# Patient Record
Sex: Female | Born: 1940 | Race: White | Hispanic: No | Marital: Married | Smoking: Former smoker
Health system: Southern US, Community
[De-identification: ages and names within clinical notes are randomized; demographics above are authoritative.]

## PROBLEM LIST (undated history)

## (undated) DIAGNOSIS — J189 Pneumonia, unspecified organism: Secondary | ICD-10-CM

## (undated) DIAGNOSIS — I10 Essential (primary) hypertension: Secondary | ICD-10-CM

## (undated) DIAGNOSIS — E039 Hypothyroidism, unspecified: Secondary | ICD-10-CM

## (undated) DIAGNOSIS — K219 Gastro-esophageal reflux disease without esophagitis: Secondary | ICD-10-CM

## (undated) DIAGNOSIS — Z9981 Dependence on supplemental oxygen: Secondary | ICD-10-CM

## (undated) DIAGNOSIS — M1990 Unspecified osteoarthritis, unspecified site: Secondary | ICD-10-CM

## (undated) DIAGNOSIS — E785 Hyperlipidemia, unspecified: Secondary | ICD-10-CM

## (undated) DIAGNOSIS — J449 Chronic obstructive pulmonary disease, unspecified: Secondary | ICD-10-CM

## (undated) DIAGNOSIS — I2510 Atherosclerotic heart disease of native coronary artery without angina pectoris: Secondary | ICD-10-CM

## (undated) DIAGNOSIS — I700 Atherosclerosis of aorta: Secondary | ICD-10-CM

## (undated) DIAGNOSIS — T7840XA Allergy, unspecified, initial encounter: Secondary | ICD-10-CM

## (undated) DIAGNOSIS — C50919 Malignant neoplasm of unspecified site of unspecified female breast: Secondary | ICD-10-CM

## (undated) DIAGNOSIS — M069 Rheumatoid arthritis, unspecified: Secondary | ICD-10-CM

## (undated) DIAGNOSIS — G2581 Restless legs syndrome: Secondary | ICD-10-CM

## (undated) DIAGNOSIS — E119 Type 2 diabetes mellitus without complications: Secondary | ICD-10-CM

## (undated) DIAGNOSIS — N61 Mastitis without abscess: Secondary | ICD-10-CM

## (undated) DIAGNOSIS — Z87442 Personal history of urinary calculi: Secondary | ICD-10-CM

## (undated) DIAGNOSIS — R0600 Dyspnea, unspecified: Secondary | ICD-10-CM

## (undated) HISTORY — PX: BACK SURGERY: SHX140

## (undated) HISTORY — PX: APPENDECTOMY: SHX54

## (undated) HISTORY — DX: Allergy, unspecified, initial encounter: T78.40XA

---

## 2004-12-13 ENCOUNTER — Ambulatory Visit: Payer: Self-pay | Admitting: Internal Medicine

## 2007-03-04 ENCOUNTER — Ambulatory Visit: Payer: Self-pay | Admitting: Internal Medicine

## 2007-03-17 ENCOUNTER — Ambulatory Visit: Payer: Self-pay | Admitting: Gastroenterology

## 2008-03-17 ENCOUNTER — Ambulatory Visit: Payer: Self-pay | Admitting: Internal Medicine

## 2008-03-28 ENCOUNTER — Ambulatory Visit: Payer: Self-pay | Admitting: Internal Medicine

## 2009-01-17 ENCOUNTER — Ambulatory Visit: Payer: Self-pay | Admitting: Internal Medicine

## 2009-04-19 ENCOUNTER — Ambulatory Visit: Payer: Self-pay | Admitting: Internal Medicine

## 2009-04-21 ENCOUNTER — Emergency Department: Payer: Self-pay | Admitting: Emergency Medicine

## 2009-04-26 ENCOUNTER — Ambulatory Visit: Payer: Self-pay | Admitting: Internal Medicine

## 2010-06-12 ENCOUNTER — Ambulatory Visit: Payer: Self-pay | Admitting: Internal Medicine

## 2010-12-05 ENCOUNTER — Ambulatory Visit: Payer: Self-pay | Admitting: Internal Medicine

## 2010-12-19 ENCOUNTER — Ambulatory Visit: Payer: Self-pay | Admitting: Internal Medicine

## 2011-10-15 ENCOUNTER — Ambulatory Visit: Payer: Self-pay | Admitting: Internal Medicine

## 2012-10-15 ENCOUNTER — Ambulatory Visit: Payer: Self-pay | Admitting: Internal Medicine

## 2012-10-28 ENCOUNTER — Ambulatory Visit: Payer: Self-pay | Admitting: Gastroenterology

## 2013-10-18 ENCOUNTER — Ambulatory Visit: Payer: Self-pay | Admitting: Internal Medicine

## 2014-01-08 DIAGNOSIS — M069 Rheumatoid arthritis, unspecified: Secondary | ICD-10-CM | POA: Insufficient documentation

## 2014-03-24 DIAGNOSIS — E119 Type 2 diabetes mellitus without complications: Secondary | ICD-10-CM | POA: Insufficient documentation

## 2014-03-24 DIAGNOSIS — J449 Chronic obstructive pulmonary disease, unspecified: Secondary | ICD-10-CM | POA: Insufficient documentation

## 2014-09-13 ENCOUNTER — Observation Stay: Payer: Self-pay | Admitting: Internal Medicine

## 2014-09-13 LAB — CBC WITH DIFFERENTIAL/PLATELET
BASOS ABS: 0 10*3/uL (ref 0.0–0.1)
BASOS PCT: 0.2 %
EOS ABS: 0 10*3/uL (ref 0.0–0.7)
Eosinophil %: 0.1 %
HCT: 45.9 % (ref 35.0–47.0)
HGB: 14.7 g/dL (ref 12.0–16.0)
Lymphocyte #: 0.9 10*3/uL — ABNORMAL LOW (ref 1.0–3.6)
Lymphocyte %: 5.9 %
MCH: 30.3 pg (ref 26.0–34.0)
MCHC: 32 g/dL (ref 32.0–36.0)
MCV: 95 fL (ref 80–100)
MONOS PCT: 13.2 %
Monocyte #: 2 x10 3/mm — ABNORMAL HIGH (ref 0.2–0.9)
Neutrophil #: 11.9 10*3/uL — ABNORMAL HIGH (ref 1.4–6.5)
Neutrophil %: 80.6 %
PLATELETS: 257 10*3/uL (ref 150–440)
RBC: 4.84 10*6/uL (ref 3.80–5.20)
RDW: 15.5 % — AB (ref 11.5–14.5)
WBC: 14.8 10*3/uL — AB (ref 3.6–11.0)

## 2014-09-13 LAB — URINALYSIS, COMPLETE
Bilirubin,UR: NEGATIVE
Glucose,UR: NEGATIVE mg/dL (ref 0–75)
Hyaline Cast: 32
Leukocyte Esterase: NEGATIVE
Nitrite: NEGATIVE
Ph: 5 (ref 4.5–8.0)
Specific Gravity: 1.027 (ref 1.003–1.030)

## 2014-09-13 LAB — BASIC METABOLIC PANEL
ANION GAP: 7 (ref 7–16)
BUN: 12 mg/dL (ref 7–18)
CALCIUM: 9.1 mg/dL (ref 8.5–10.1)
Chloride: 99 mmol/L (ref 98–107)
Co2: 31 mmol/L (ref 21–32)
Creatinine: 0.8 mg/dL (ref 0.60–1.30)
GLUCOSE: 136 mg/dL — AB (ref 65–99)
Osmolality: 276 (ref 275–301)
POTASSIUM: 4.1 mmol/L (ref 3.5–5.1)
Sodium: 137 mmol/L (ref 136–145)

## 2014-09-13 LAB — PROTIME-INR
INR: 1.1
Prothrombin Time: 14.3 secs (ref 11.5–14.7)

## 2014-09-13 LAB — TROPONIN I

## 2014-09-13 LAB — MAGNESIUM: MAGNESIUM: 2 mg/dL

## 2014-09-13 LAB — PHOSPHORUS: Phosphorus: 2 mg/dL — ABNORMAL LOW (ref 2.5–4.9)

## 2014-09-14 LAB — BASIC METABOLIC PANEL
Anion Gap: 6 — ABNORMAL LOW (ref 7–16)
BUN: 8 mg/dL (ref 7–18)
CALCIUM: 8.3 mg/dL — AB (ref 8.5–10.1)
CO2: 34 mmol/L — AB (ref 21–32)
Chloride: 102 mmol/L (ref 98–107)
Creatinine: 0.67 mg/dL (ref 0.60–1.30)
EGFR (Non-African Amer.): >60
GLUCOSE: 118 mg/dL — AB (ref 65–99)
Osmolality: 283 (ref 275–301)
Potassium: 4.2 mmol/L (ref 3.5–5.1)
Sodium: 142 mmol/L (ref 136–145)

## 2014-09-14 LAB — CBC WITH DIFFERENTIAL/PLATELET
Bands: 1 %
Comment - H1-Com1: NORMAL
HCT: 39.6 % (ref 35.0–47.0)
HGB: 12.8 g/dL (ref 12.0–16.0)
Lymphocytes: 14 %
MCH: 30.7 pg (ref 26.0–34.0)
MCHC: 32.2 g/dL (ref 32.0–36.0)
MCV: 95 fL (ref 80–100)
Monocytes: 16 %
Platelet: 227 10*3/uL (ref 150–440)
RBC: 4.16 10*6/uL (ref 3.80–5.20)
RDW: 15.9 % — ABNORMAL HIGH (ref 11.5–14.5)
SEGMENTED NEUTROPHILS: 69 %
WBC: 14.2 10*3/uL — ABNORMAL HIGH (ref 3.6–11.0)

## 2014-09-15 LAB — URINE CULTURE

## 2014-09-18 LAB — CULTURE, BLOOD (SINGLE)

## 2014-09-22 DIAGNOSIS — E7800 Pure hypercholesterolemia, unspecified: Secondary | ICD-10-CM | POA: Insufficient documentation

## 2014-11-17 ENCOUNTER — Ambulatory Visit: Payer: Self-pay | Admitting: Internal Medicine

## 2014-12-22 DIAGNOSIS — E034 Atrophy of thyroid (acquired): Secondary | ICD-10-CM | POA: Insufficient documentation

## 2014-12-22 DIAGNOSIS — G2581 Restless legs syndrome: Secondary | ICD-10-CM | POA: Insufficient documentation

## 2014-12-23 ENCOUNTER — Ambulatory Visit: Admit: 2014-12-23 | Disposition: A | Discharge status: Home / Self Care | Payer: Self-pay | Admitting: Internal Medicine

## 2015-01-03 ENCOUNTER — Ambulatory Visit
Admit: 2015-01-03 | Disposition: A | Discharge status: Home / Self Care | Payer: Self-pay | Attending: Internal Medicine | Admitting: Internal Medicine

## 2015-01-08 NOTE — H&P (Signed)
PATIENT NAME:  Amy Woodard, Amy Woodard MR#:  [id number] DATE OF BIRTH:  February 28, 1941  DATE OF ADMISSION:  09/13/2014  REFERRING PHYSICIAN:  Archie Balboa, MD   PRIMARY CARE PHYSICIAN: Gilford Rile, MD, of [facility].   CHIEF COMPLAINT: Feeling unwell.   HISTORY OF PRESENT ILLNESS:  A 74 year old Caucasian female with history of COPD non-O2 dependent, type 2 diabetes, uncomplicated; presenting with generalized weakness, malaise. She describes 2 days of weakness, malaise with associated cough, nonproductive and she denies any frank shortness of breath, mild dyspnea on exertion; denies any fevers, chills, or really further symptomatology; presented to the hospital for further work-up and evaluation given persistent symptoms. In the ER course she was actually given broad-spectrum antibiotics with vancomycin, Zosyn. She was also noted to be hypoxic upon arrival with SaO2 of 87% on room air.   REVIEW OF SYSTEMS:   CONSTITUTIONAL: Denies fevers, positive for chills, positive for weakness, fatigue.  EYES: Denies blurred vision, double vision, eye pain.  EARS AND NOSE AND THROAT: Denies tinnitus, ear pain, hearing loss.  RESPIRATORY:  Positive for cough as described above, as well as dyspnea on exertion.  CARDIOVASCULAR: Denies chest pain, palpitations, or edema.  GASTROINTESTINAL: Denies nausea, vomiting, diarrhea, or abdominal pain.  GENITOURINARY: Denies dysuria, hematuria.  ENDOCRINE: Denies nocturia or thyroid problems.  HEMATOLOGIC: Denies easy bruising, bleeding.  SKIN: Denies rash or lesions.  MUSCULOSKELETAL: Denies pain in neck, back, shoulder, knees, hips or arthritic symptoms.  NEUROLOGIC: Denies paralysis, paresthesias.  PSYCHIATRIC: Denies anxiety or depressive symptoms. Otherwise full review of systems performed is negative.   PAST MEDICAL HISTORY: COPD non-O2 dependent, as well as type 2 diabetes uncomplicated.   SOCIAL HISTORY: Positive for remote tobacco use, denies any alcohol or drug use.    FAMILY HISTORY: Positive for coronary artery disease.   ALLERGIES: SULFA DRUGS.   HOME MEDICATIONS: Include glipizide 5 mg p.o. daily, atorvastatin 20 mg p.o. daily, methotrexate 2.5 mg 2 tablets on Wednesdays, 3 tablets on Thursdays, Proventil 90 mcg inhalation 2 puffs 4 times daily as needed for shortness of breath, folic acid 1 mg p.o. daily.   PHYSICAL EXAMINATION: VITAL SIGNS: Temperature 101.3 degrees Fahrenheit, heart rate of 101, respirations 18, blood pressure 130/51, saturating 87% on room air, currently saturating 97% on 2 liters nasal cannula, weight 72.6 kg, BMI 25.1.  GENERAL: Well-nourished, well-developed, Caucasian female, currently in no acute distress.  HEAD: Normocephalic, atraumatic.  EYES: Pupils equal, round and reactive to light, extraocular muscles intact, no scleral icterus.  MOUTH: Moist mucosal membrane. Dentition intact. No abscess noted.  EAR AND NOSE AND THROAT: Clear without exudates, no external lesions.  NECK: Supple. No thyromegaly, no nodules, no JVD.  PULMONARY: Some scant expiratory wheezing noted in the right base, otherwise no rhonchi or rales, no use of accessory muscles, good respiratory effort.  CHEST: Nontender to palpation.  CARDIOVASCULAR: S1, S2, regular rate and rhythm, no murmurs, rubs, or gallops, no edema; pedal pulses 2+ bilaterally.  GASTROINTESTINAL:  Soft, nontender, nondistended. No masses. Positive bowel sounds. No hepatosplenomegaly.  MUSCULOSKELETAL: No swelling, clubbing, or edema. Range of motion full in all extremities.  NEUROLOGIC: Cranial nerves II through XII intact , sensation intact, reflexes intact.  SKIN: No ulceration, lesions, rashes, or cyanosis, skin warm, dry, turgor intact.  PSYCHIATRIC: Mood and affect within normal limits. The patient is awake, alert, oriented x 3, insight and judgment intact.   DIAGNOSTIC DATA: Sodium 137, potassium 4.1, chloride of 99, bicarbonate of 31, BUN 12, creatinine 0.8, glucose of 136,  troponin less than 0.22, WBC 14.8, which is neutrophil predominant; hemoglobin 14.7, platelets of 257,000; urinalysis negative for evidence of infection, lactic acid of 1.2, chest x-ray performed which reveals no acute cardiopulmonary process.   ASSESSMENT AND PLAN: A 74 year old Caucasian female with a history of chronic obstructive pulmonary disease, non-O2 dependent, type 2 diabetes, uncomplicated; presenting with generalized malaise and cough.  1.  Chronic obstructive pulmonary disease exacerbation. Provide supplemental O2 to keep SaO2 greater than 92%, DuoNeb treatments every 4 hours; steroids of Solu-Medrol 60 mg intravenous  daily; as well as add azithromycin on for her cough.  2.  Type 2 diabetes, uncomplicated. Add insulin sliding scale with every 6 hour Accu-Cheks, hold by mouth agents.  3.  Hypophosphatemia. Replace.  4.  Venous thromboembolism prophylaxis with heparin subcutaneous.   CODE STATUS: The patient is full code.   TIME SPENT: Was 45 minutes.    ____________________________ Aaron Mose. Annelise Mccoy, MD dkh:nt D: 09/13/2014 08:13:88 ET T: 09/13/2014 23:34:09 ET JOB#: [id number]  cc: Aaron Mose. Lavina Resor, MD, <Dictator> Luvern Mcisaac Woodfin Ganja MD ELECTRONICALLY SIGNED 09/20/2014 20:35

## 2015-01-08 NOTE — Discharge Summary (Signed)
PATIENT NAME:  Amy Woodard, Amy Woodard MR#:  [id number] DATE OF BIRTH:  27-Jul-1941  DATE OF ADMISSION:  09/13/2014 DATE OF DISCHARGE:  09/15/2014  DISCHARGE DIAGNOSES: 1. Chronic obstructive pulmonary disease flare with bronchitis and hypoxemia.  2. Rheumatoid arthritis.  3. Hyperlipidemia.  4. Diabetes mellitus, non-insulin-requiring.   DISCHARGE MEDICATIONS: Prednisone taper, Ceftin 250 mg b.i.d., Z-Pak, atorvastatin 20 mg at bedtime, folic acid 1 mg daily, glipizide 5 mg daily, methotrexate 3 tabs on Thursday, Proventil 2 puffs t.i.d.   REASON FOR ADMISSION: A 74 year old female who presents with cough, congestion, and hypoxia. Please see H and P for HPI, past medical history, and physical exam.   HOSPITAL COURSE: The patient was admitted. Chest x-ray did not show any infiltrate or abnormality. She had profound wheezing on exam and was treated with steroids and antibiotics, with IV azithromycin and Zosyn. She improved dramatically. She came off O2 with resting O2 saturations of 90% with no dyspnea on exertion or tachycardia. She did note some left shoulder pain on discharge, which was reproducible with shoulder movement and pain, which she has had before. No tachycardia. No instability hemodynamically. Lungs are totally clear on discharge.   She was given etodolac x 1 for shoulder and told to call Dr. Gilford Rile any other symptoms.   FOLLOWUP APPOINTMENTS: She will follow up with Dr. Gilford Rile in 1 week and will be on the prednisone taper with her inhaler, Ceftin, and azithromycin.    ____________________________ Rusty Aus, MD mfm:mw D: 09/15/2014 07:56:00 ET T: 09/15/2014 13:20:42 ET JOB#: [id number]  cc: Rusty Aus, MD, <Dictator> John B. Sarina Ser, MD MARK Roselee Culver MD ELECTRONICALLY SIGNED 09/15/2014 19:17

## 2015-01-12 DIAGNOSIS — N200 Calculus of kidney: Secondary | ICD-10-CM | POA: Insufficient documentation

## 2015-09-21 DIAGNOSIS — E119 Type 2 diabetes mellitus without complications: Secondary | ICD-10-CM | POA: Diagnosis not present

## 2015-10-10 DIAGNOSIS — J449 Chronic obstructive pulmonary disease, unspecified: Secondary | ICD-10-CM | POA: Diagnosis not present

## 2015-10-10 DIAGNOSIS — R319 Hematuria, unspecified: Secondary | ICD-10-CM | POA: Diagnosis not present

## 2015-10-10 DIAGNOSIS — E119 Type 2 diabetes mellitus without complications: Secondary | ICD-10-CM | POA: Diagnosis not present

## 2015-10-25 DIAGNOSIS — R252 Cramp and spasm: Secondary | ICD-10-CM | POA: Diagnosis not present

## 2015-10-25 DIAGNOSIS — M0579 Rheumatoid arthritis with rheumatoid factor of multiple sites without organ or systems involvement: Secondary | ICD-10-CM | POA: Diagnosis not present

## 2015-11-21 DIAGNOSIS — Z79899 Other long term (current) drug therapy: Secondary | ICD-10-CM | POA: Diagnosis not present

## 2015-12-27 DIAGNOSIS — J042 Acute laryngotracheitis: Secondary | ICD-10-CM | POA: Diagnosis not present

## 2016-01-08 DIAGNOSIS — E119 Type 2 diabetes mellitus without complications: Secondary | ICD-10-CM | POA: Diagnosis not present

## 2016-01-08 DIAGNOSIS — M069 Rheumatoid arthritis, unspecified: Secondary | ICD-10-CM | POA: Diagnosis not present

## 2016-01-08 DIAGNOSIS — R5383 Other fatigue: Secondary | ICD-10-CM | POA: Diagnosis not present

## 2016-01-08 DIAGNOSIS — E034 Atrophy of thyroid (acquired): Secondary | ICD-10-CM | POA: Diagnosis not present

## 2016-01-17 DIAGNOSIS — B179 Acute viral hepatitis, unspecified: Secondary | ICD-10-CM | POA: Diagnosis not present

## 2016-01-17 DIAGNOSIS — M0579 Rheumatoid arthritis with rheumatoid factor of multiple sites without organ or systems involvement: Secondary | ICD-10-CM | POA: Diagnosis not present

## 2016-01-17 DIAGNOSIS — R252 Cramp and spasm: Secondary | ICD-10-CM | POA: Diagnosis not present

## 2016-01-24 DIAGNOSIS — R945 Abnormal results of liver function studies: Secondary | ICD-10-CM | POA: Diagnosis not present

## 2016-01-24 DIAGNOSIS — M0579 Rheumatoid arthritis with rheumatoid factor of multiple sites without organ or systems involvement: Secondary | ICD-10-CM | POA: Diagnosis not present

## 2016-03-08 DIAGNOSIS — M0579 Rheumatoid arthritis with rheumatoid factor of multiple sites without organ or systems involvement: Secondary | ICD-10-CM | POA: Diagnosis not present

## 2016-03-08 DIAGNOSIS — M79641 Pain in right hand: Secondary | ICD-10-CM | POA: Diagnosis not present

## 2016-03-08 DIAGNOSIS — R945 Abnormal results of liver function studies: Secondary | ICD-10-CM | POA: Diagnosis not present

## 2016-04-11 DIAGNOSIS — M0579 Rheumatoid arthritis with rheumatoid factor of multiple sites without organ or systems involvement: Secondary | ICD-10-CM | POA: Diagnosis not present

## 2016-04-11 DIAGNOSIS — R7989 Other specified abnormal findings of blood chemistry: Secondary | ICD-10-CM | POA: Diagnosis not present

## 2016-04-16 DIAGNOSIS — E119 Type 2 diabetes mellitus without complications: Secondary | ICD-10-CM | POA: Diagnosis not present

## 2016-05-20 DIAGNOSIS — R7989 Other specified abnormal findings of blood chemistry: Secondary | ICD-10-CM | POA: Diagnosis not present

## 2016-05-20 DIAGNOSIS — M0579 Rheumatoid arthritis with rheumatoid factor of multiple sites without organ or systems involvement: Secondary | ICD-10-CM | POA: Diagnosis not present

## 2016-05-23 DIAGNOSIS — E119 Type 2 diabetes mellitus without complications: Secondary | ICD-10-CM | POA: Diagnosis not present

## 2016-05-23 DIAGNOSIS — M05741 Rheumatoid arthritis with rheumatoid factor of right hand without organ or systems involvement: Secondary | ICD-10-CM | POA: Diagnosis not present

## 2016-05-23 DIAGNOSIS — Z79899 Other long term (current) drug therapy: Secondary | ICD-10-CM | POA: Diagnosis not present

## 2016-05-27 DIAGNOSIS — R7989 Other specified abnormal findings of blood chemistry: Secondary | ICD-10-CM | POA: Diagnosis not present

## 2016-05-27 DIAGNOSIS — M0579 Rheumatoid arthritis with rheumatoid factor of multiple sites without organ or systems involvement: Secondary | ICD-10-CM | POA: Diagnosis not present

## 2016-05-27 DIAGNOSIS — M7662 Achilles tendinitis, left leg: Secondary | ICD-10-CM | POA: Diagnosis not present

## 2016-07-17 DIAGNOSIS — Z23 Encounter for immunization: Secondary | ICD-10-CM | POA: Diagnosis not present

## 2016-07-17 DIAGNOSIS — M0579 Rheumatoid arthritis with rheumatoid factor of multiple sites without organ or systems involvement: Secondary | ICD-10-CM | POA: Diagnosis not present

## 2016-07-17 DIAGNOSIS — M542 Cervicalgia: Secondary | ICD-10-CM | POA: Diagnosis not present

## 2016-07-17 DIAGNOSIS — Z79899 Other long term (current) drug therapy: Secondary | ICD-10-CM | POA: Diagnosis not present

## 2016-07-24 DIAGNOSIS — Z0001 Encounter for general adult medical examination with abnormal findings: Secondary | ICD-10-CM | POA: Diagnosis not present

## 2016-07-24 DIAGNOSIS — J449 Chronic obstructive pulmonary disease, unspecified: Secondary | ICD-10-CM | POA: Diagnosis not present

## 2016-07-24 DIAGNOSIS — E034 Atrophy of thyroid (acquired): Secondary | ICD-10-CM | POA: Diagnosis not present

## 2016-07-24 DIAGNOSIS — Z1231 Encounter for screening mammogram for malignant neoplasm of breast: Secondary | ICD-10-CM | POA: Diagnosis not present

## 2016-07-24 DIAGNOSIS — E7800 Pure hypercholesterolemia, unspecified: Secondary | ICD-10-CM | POA: Diagnosis not present

## 2016-07-24 DIAGNOSIS — Z0000 Encounter for general adult medical examination without abnormal findings: Secondary | ICD-10-CM | POA: Diagnosis not present

## 2016-07-24 DIAGNOSIS — E2839 Other primary ovarian failure: Secondary | ICD-10-CM | POA: Diagnosis not present

## 2016-07-24 DIAGNOSIS — E119 Type 2 diabetes mellitus without complications: Secondary | ICD-10-CM | POA: Diagnosis not present

## 2016-08-08 DIAGNOSIS — E2839 Other primary ovarian failure: Secondary | ICD-10-CM | POA: Diagnosis not present

## 2016-10-28 DIAGNOSIS — E119 Type 2 diabetes mellitus without complications: Secondary | ICD-10-CM | POA: Diagnosis not present

## 2016-11-04 DIAGNOSIS — E119 Type 2 diabetes mellitus without complications: Secondary | ICD-10-CM | POA: Diagnosis not present

## 2016-11-04 DIAGNOSIS — J449 Chronic obstructive pulmonary disease, unspecified: Secondary | ICD-10-CM | POA: Diagnosis not present

## 2016-12-12 ENCOUNTER — Emergency Department: Payer: PPO

## 2016-12-12 ENCOUNTER — Encounter: Payer: Self-pay | Admitting: Emergency Medicine

## 2016-12-12 ENCOUNTER — Emergency Department
Admission: EM | Admit: 2016-12-12 | Discharge: 2016-12-12 | Disposition: A | Discharge status: Home / Self Care | Payer: PPO | Attending: Emergency Medicine | Admitting: Emergency Medicine

## 2016-12-12 DIAGNOSIS — Z7984 Long term (current) use of oral hypoglycemic drugs: Secondary | ICD-10-CM | POA: Insufficient documentation

## 2016-12-12 DIAGNOSIS — J209 Acute bronchitis, unspecified: Secondary | ICD-10-CM | POA: Insufficient documentation

## 2016-12-12 DIAGNOSIS — Z87891 Personal history of nicotine dependence: Secondary | ICD-10-CM | POA: Diagnosis not present

## 2016-12-12 DIAGNOSIS — Y939 Activity, unspecified: Secondary | ICD-10-CM | POA: Diagnosis not present

## 2016-12-12 DIAGNOSIS — W1800XA Striking against unspecified object with subsequent fall, initial encounter: Secondary | ICD-10-CM | POA: Insufficient documentation

## 2016-12-12 DIAGNOSIS — J449 Chronic obstructive pulmonary disease, unspecified: Secondary | ICD-10-CM | POA: Diagnosis not present

## 2016-12-12 DIAGNOSIS — Y929 Unspecified place or not applicable: Secondary | ICD-10-CM | POA: Diagnosis not present

## 2016-12-12 DIAGNOSIS — S20211A Contusion of right front wall of thorax, initial encounter: Secondary | ICD-10-CM | POA: Diagnosis not present

## 2016-12-12 DIAGNOSIS — Y999 Unspecified external cause status: Secondary | ICD-10-CM | POA: Insufficient documentation

## 2016-12-12 DIAGNOSIS — Z79899 Other long term (current) drug therapy: Secondary | ICD-10-CM | POA: Diagnosis not present

## 2016-12-12 DIAGNOSIS — S299XXA Unspecified injury of thorax, initial encounter: Secondary | ICD-10-CM | POA: Diagnosis not present

## 2016-12-12 HISTORY — DX: Chronic obstructive pulmonary disease, unspecified: J44.9

## 2016-12-12 MED ORDER — TRAMADOL HCL 50 MG PO TABS: 50.0000 mg | ORAL_TABLET | Freq: Four times a day (QID) | ORAL | 0 refills | Status: DC | PRN

## 2016-12-12 MED ORDER — AZITHROMYCIN 250 MG PO TABS: ORAL_TABLET | ORAL | 0 refills | Status: DC

## 2016-12-12 NOTE — ED Provider Notes (Signed)
[facility] Emergency Department Provider Note   ____________________________________________   First MD Initiated Contact with Patient 12/12/16 [phone]     (approximate)  I have reviewed the triage vital signs and the nursing notes.   HISTORY  Chief Complaint Fall and Rib Injury    HPI Amy Woodard is a 76 y.o. female is here complaint of right upper chest wall pain after falling Tuesday. She states that 2 days ago she was bending over taking care of her is in the bushes when she fell forward and hit a flowerpot with her chest. She is continued to have some increased pain with taking deep breaths. She denies any head injury or loss of consciousness. She states that this was not a syncopal spell but that she lost her balance while she was leaning forward. Patient has been taking some over-the-counter Tylenol without any relief of her pain. Patient rates her pain as a 10 over 10. She denies any previous chest wall injuries.   Past Medical History:  Diagnosis Date  . COPD (chronic obstructive pulmonary disease) (HCC)     There are no active problems to display for this patient.   History reviewed. No pertinent surgical history.  Prior to Admission medications   Medication Sig Start Date End Date Taking? Authorizing Provider  glipiZIDE (GLUCOTROL) 5 MG tablet Take 5 mg by mouth daily before breakfast.   Yes Historical Provider, MD  hydroxychloroquine (PLAQUENIL) 200 MG tablet Take 200 mg by mouth daily.   Yes Historical Provider, MD  Ipratropium-Albuterol (COMBIVENT) 20-100 MCG/ACT AERS respimat Inhale 1 puff into the lungs every 6 (six) hours.   Yes Historical Provider, MD  levothyroxine (SYNTHROID, LEVOTHROID) 50 MCG tablet Take 50 mcg by mouth daily before breakfast.   Yes Historical Provider, MD  azithromycin (ZITHROMAX Z-PAK) 250 MG tablet Take 2 tablets (500 mg) on  Day 1,  followed by 1 tablet (250 mg) once daily on Days 2 through 5. 12/12/16    Johnn Hai, PA-C  traMADol (ULTRAM) 50 MG tablet Take 1 tablet (50 mg total) by mouth every 6 (six) hours as needed. 12/12/16   Johnn Hai, PA-C    Allergies Sulfa antibiotics  No family history on file.  Social History Social History  Substance Use Topics  . Smoking status: Former Research scientist (life sciences)  . Smokeless tobacco: Never Used  . Alcohol use No    Review of Systems Constitutional: No fever/chills Eyes: No visual changes. ENT: No trauma Cardiovascular: Denies chest pain. Respiratory: Denies shortness of breath.  Right upper chest wall pain. Positive for cough. Gastrointestinal: No abdominal pain.  No nausea, no vomiting.  Genitourinary: Negative for dysuria. Musculoskeletal: Negative for back pain. As stated above right upper chest wall pain. Skin: Negative for rash. Neurological: Negative for headaches, focal weakness or numbness.  10-point ROS otherwise negative.  ____________________________________________   PHYSICAL EXAM:  VITAL SIGNS: ED Triage Vitals  Enc Vitals Group     BP 12/12/16 0732 (!) 153/57     Pulse Rate 12/12/16 0732 93     Resp 12/12/16 0732 18     Temp 12/12/16 0732 98.5 F (36.9 C)     Temp Source 12/12/16 0732 Oral     SpO2 12/12/16 0732 91 %     Weight 12/12/16 0733 150 lb (68 kg)     Height 12/12/16 0733 5\' 8"  (1.727 m)     Head Circumference --      Peak Flow --  Pain Score 12/12/16 0725 10     Pain Loc --      Pain Edu? --      Excl. in [place]? --     Constitutional: Alert and oriented. Well appearing and in no acute distress. Eyes: Conjunctivae are normal. PERRL. EOMI. Head: Atraumatic. Nose: No congestion/rhinnorhea. Neck: No stridor.  No cervical tenderness on palpation posteriorly. Cardiovascular: Normal rate, regular rhythm. Grossly normal heart sounds.  Good peripheral circulation. Respiratory: Normal respiratory effort.  No retractions. Lungs CTAB. On examination of the chest wall there is no deformity, no ecchymosis,  orsoft tissue swelling. There is some diffuse tenderness on light palpation right anterior chest. Gastrointestinal: Soft and nontender. No distention. Musculoskeletal: No lower extremity tenderness nor edema.  No joint effusions. Neurologic:  Normal speech and language. No gross focal neurologic deficits are appreciated. No gait instability. Skin:  Skin is warm, dry and intact. No ecchymosis or abrasions are seen. Psychiatric: Mood and affect are normal. Speech and behavior are normal.  ____________________________________________   LABS (all labs ordered are listed, but only abnormal results are displayed)  Labs Reviewed - No data to display  RADIOLOGY Right rib x-ray with chest per radiologist. IMPRESSION:  Chronic bronchitic changes. Patchy density inferiorly in the right  upper lobe may reflect scarring, infiltrate, atelectasis, nodule,or  contusion. Antibiotics may be indicated if there are coexisting  bronchitic or pneumonia symptoms. A follow-up chest x-ray in 2-3  weeks is recommended to reassess the right upper lobe and exclude  early malignancy.    No acute right rib fracture is observed.     ____________________________________________   PROCEDURES  Procedure(s) performed: None  Procedures  Critical Care performed: No  ____________________________________________   INITIAL IMPRESSION / ASSESSMENT AND PLAN / ED COURSE  Pertinent labs & imaging results that were available during my care of the patient were reviewed by me and considered in my medical decision making (see chart for details).  Patient was made aware that her chest x-ray did not show any fractures. She does have some upper respiratory symptoms with nonproductive cough and has a history of bronchitis. Patient states that she is done well on Zithromax in the past. She was given a prescription for Zithromax along with tramadol as needed for pain. She is to make an appointment with Dr. Gilford Rile in 3  weeks at that time he will decide whether a repeat chest x-ray as needed.      ____________________________________________   FINAL CLINICAL IMPRESSION(S) / ED DIAGNOSES  Final diagnoses:  Contusion, chest wall, right, initial encounter  Acute bronchitis, unspecified organism      NEW MEDICATIONS STARTED DURING THIS VISIT:  Discharge Medication List as of 12/12/2016  8:43 AM    START taking these medications   Details  azithromycin (ZITHROMAX Z-PAK) 250 MG tablet Take 2 tablets (500 mg) on  Day 1,  followed by 1 tablet (250 mg) once daily on Days 2 through 5., Print    traMADol (ULTRAM) 50 MG tablet Take 1 tablet (50 mg total) by mouth every 6 (six) hours as needed., Starting Thu 12/12/2016, Print         Note:  This document was prepared using Dragon voice recognition software and may include unintentional dictation errors.    Johnn Hai, PA-C 12/12/16 [place], MD 12/12/16 1540

## 2016-12-12 NOTE — ED Triage Notes (Signed)
Fell Tuesday bending  To tend to an azalea bush.  Hit right upper chest on a flower pot.  Now pain esp with breathing

## 2016-12-12 NOTE — Discharge Instructions (Signed)
Follow up with Dr. Gilford Rile.  Call and make an appointment in 3 weeks Zithromax as directed. Tramadol as needed for pain

## 2016-12-12 NOTE — ED Notes (Signed)
See triage note  States she was trimming flowers on tuesday  Lost her balance and fell forward   Hit her chest on flower pot  Having increased pain to right upper chest and under right arm  Increased pain with inspiration

## 2017-01-06 DIAGNOSIS — S27321A Contusion of lung, unilateral, initial encounter: Secondary | ICD-10-CM | POA: Diagnosis not present

## 2017-01-06 DIAGNOSIS — S27321D Contusion of lung, unilateral, subsequent encounter: Secondary | ICD-10-CM | POA: Diagnosis not present

## 2017-01-09 DIAGNOSIS — M542 Cervicalgia: Secondary | ICD-10-CM | POA: Diagnosis not present

## 2017-01-09 DIAGNOSIS — M0579 Rheumatoid arthritis with rheumatoid factor of multiple sites without organ or systems involvement: Secondary | ICD-10-CM | POA: Diagnosis not present

## 2017-01-09 DIAGNOSIS — Z79899 Other long term (current) drug therapy: Secondary | ICD-10-CM | POA: Diagnosis not present

## 2017-01-16 DIAGNOSIS — R0781 Pleurodynia: Secondary | ICD-10-CM | POA: Diagnosis not present

## 2017-01-16 DIAGNOSIS — M0579 Rheumatoid arthritis with rheumatoid factor of multiple sites without organ or systems involvement: Secondary | ICD-10-CM | POA: Diagnosis not present

## 2017-01-30 DIAGNOSIS — E119 Type 2 diabetes mellitus without complications: Secondary | ICD-10-CM | POA: Diagnosis not present

## 2017-02-06 DIAGNOSIS — E119 Type 2 diabetes mellitus without complications: Secondary | ICD-10-CM | POA: Diagnosis not present

## 2017-02-06 DIAGNOSIS — E034 Atrophy of thyroid (acquired): Secondary | ICD-10-CM | POA: Diagnosis not present

## 2017-05-05 DIAGNOSIS — E119 Type 2 diabetes mellitus without complications: Secondary | ICD-10-CM | POA: Diagnosis not present

## 2017-05-13 DIAGNOSIS — J449 Chronic obstructive pulmonary disease, unspecified: Secondary | ICD-10-CM | POA: Diagnosis not present

## 2017-05-13 DIAGNOSIS — E119 Type 2 diabetes mellitus without complications: Secondary | ICD-10-CM | POA: Diagnosis not present

## 2017-05-30 DIAGNOSIS — Z23 Encounter for immunization: Secondary | ICD-10-CM | POA: Diagnosis not present

## 2017-07-17 DIAGNOSIS — M0579 Rheumatoid arthritis with rheumatoid factor of multiple sites without organ or systems involvement: Secondary | ICD-10-CM | POA: Diagnosis not present

## 2017-07-24 DIAGNOSIS — M0579 Rheumatoid arthritis with rheumatoid factor of multiple sites without organ or systems involvement: Secondary | ICD-10-CM | POA: Diagnosis not present

## 2017-07-24 DIAGNOSIS — H9202 Otalgia, left ear: Secondary | ICD-10-CM | POA: Diagnosis not present

## 2017-07-24 DIAGNOSIS — J90 Pleural effusion, not elsewhere classified: Secondary | ICD-10-CM | POA: Diagnosis not present

## 2017-07-24 DIAGNOSIS — R0609 Other forms of dyspnea: Secondary | ICD-10-CM | POA: Diagnosis not present

## 2017-08-01 DIAGNOSIS — J019 Acute sinusitis, unspecified: Secondary | ICD-10-CM | POA: Diagnosis not present

## 2017-08-01 DIAGNOSIS — R05 Cough: Secondary | ICD-10-CM | POA: Diagnosis not present

## 2017-08-01 DIAGNOSIS — J40 Bronchitis, not specified as acute or chronic: Secondary | ICD-10-CM | POA: Diagnosis not present

## 2017-08-01 DIAGNOSIS — B9689 Other specified bacterial agents as the cause of diseases classified elsewhere: Secondary | ICD-10-CM | POA: Diagnosis not present

## 2017-08-11 DIAGNOSIS — E119 Type 2 diabetes mellitus without complications: Secondary | ICD-10-CM | POA: Diagnosis not present

## 2017-08-15 DIAGNOSIS — R0902 Hypoxemia: Secondary | ICD-10-CM | POA: Diagnosis not present

## 2017-08-15 DIAGNOSIS — R05 Cough: Secondary | ICD-10-CM | POA: Diagnosis not present

## 2017-08-15 DIAGNOSIS — E034 Atrophy of thyroid (acquired): Secondary | ICD-10-CM | POA: Diagnosis not present

## 2017-08-15 DIAGNOSIS — E7800 Pure hypercholesterolemia, unspecified: Secondary | ICD-10-CM | POA: Diagnosis not present

## 2017-08-15 DIAGNOSIS — R0602 Shortness of breath: Secondary | ICD-10-CM | POA: Diagnosis not present

## 2017-08-15 DIAGNOSIS — J449 Chronic obstructive pulmonary disease, unspecified: Secondary | ICD-10-CM | POA: Diagnosis not present

## 2017-08-15 DIAGNOSIS — E119 Type 2 diabetes mellitus without complications: Secondary | ICD-10-CM | POA: Diagnosis not present

## 2017-09-15 DIAGNOSIS — J449 Chronic obstructive pulmonary disease, unspecified: Secondary | ICD-10-CM | POA: Diagnosis not present

## 2017-09-25 DIAGNOSIS — J449 Chronic obstructive pulmonary disease, unspecified: Secondary | ICD-10-CM | POA: Diagnosis not present

## 2017-10-16 DIAGNOSIS — J449 Chronic obstructive pulmonary disease, unspecified: Secondary | ICD-10-CM | POA: Diagnosis not present

## 2017-11-10 DIAGNOSIS — E119 Type 2 diabetes mellitus without complications: Secondary | ICD-10-CM | POA: Diagnosis not present

## 2017-11-13 DIAGNOSIS — J9611 Chronic respiratory failure with hypoxia: Secondary | ICD-10-CM | POA: Insufficient documentation

## 2017-11-13 DIAGNOSIS — E7800 Pure hypercholesterolemia, unspecified: Secondary | ICD-10-CM | POA: Diagnosis not present

## 2017-11-13 DIAGNOSIS — D124 Benign neoplasm of descending colon: Secondary | ICD-10-CM | POA: Insufficient documentation

## 2017-11-13 DIAGNOSIS — M542 Cervicalgia: Secondary | ICD-10-CM | POA: Diagnosis not present

## 2017-11-13 DIAGNOSIS — J449 Chronic obstructive pulmonary disease, unspecified: Secondary | ICD-10-CM | POA: Diagnosis not present

## 2017-11-13 DIAGNOSIS — E034 Atrophy of thyroid (acquired): Secondary | ICD-10-CM | POA: Diagnosis not present

## 2017-11-13 DIAGNOSIS — E119 Type 2 diabetes mellitus without complications: Secondary | ICD-10-CM | POA: Diagnosis not present

## 2017-12-14 DIAGNOSIS — J449 Chronic obstructive pulmonary disease, unspecified: Secondary | ICD-10-CM | POA: Diagnosis not present

## 2017-12-24 DIAGNOSIS — J449 Chronic obstructive pulmonary disease, unspecified: Secondary | ICD-10-CM | POA: Diagnosis not present

## 2017-12-24 DIAGNOSIS — R0609 Other forms of dyspnea: Secondary | ICD-10-CM | POA: Diagnosis not present

## 2017-12-24 DIAGNOSIS — Z9981 Dependence on supplemental oxygen: Secondary | ICD-10-CM | POA: Diagnosis not present

## 2018-01-10 DIAGNOSIS — J449 Chronic obstructive pulmonary disease, unspecified: Secondary | ICD-10-CM | POA: Diagnosis not present

## 2018-01-13 DIAGNOSIS — J449 Chronic obstructive pulmonary disease, unspecified: Secondary | ICD-10-CM | POA: Diagnosis not present

## 2018-01-20 DIAGNOSIS — M0579 Rheumatoid arthritis with rheumatoid factor of multiple sites without organ or systems involvement: Secondary | ICD-10-CM | POA: Diagnosis not present

## 2018-01-20 DIAGNOSIS — J449 Chronic obstructive pulmonary disease, unspecified: Secondary | ICD-10-CM | POA: Diagnosis not present

## 2018-01-20 DIAGNOSIS — L659 Nonscarring hair loss, unspecified: Secondary | ICD-10-CM | POA: Diagnosis not present

## 2018-02-09 DIAGNOSIS — E119 Type 2 diabetes mellitus without complications: Secondary | ICD-10-CM | POA: Diagnosis not present

## 2018-02-09 DIAGNOSIS — E034 Atrophy of thyroid (acquired): Secondary | ICD-10-CM | POA: Diagnosis not present

## 2018-02-10 DIAGNOSIS — J449 Chronic obstructive pulmonary disease, unspecified: Secondary | ICD-10-CM | POA: Diagnosis not present

## 2018-02-13 DIAGNOSIS — J449 Chronic obstructive pulmonary disease, unspecified: Secondary | ICD-10-CM | POA: Diagnosis not present

## 2018-02-16 ENCOUNTER — Other Ambulatory Visit: Payer: Self-pay | Admitting: Internal Medicine

## 2018-02-16 DIAGNOSIS — E7800 Pure hypercholesterolemia, unspecified: Secondary | ICD-10-CM | POA: Diagnosis not present

## 2018-02-16 DIAGNOSIS — E034 Atrophy of thyroid (acquired): Secondary | ICD-10-CM | POA: Diagnosis not present

## 2018-02-16 DIAGNOSIS — Z1231 Encounter for screening mammogram for malignant neoplasm of breast: Secondary | ICD-10-CM | POA: Diagnosis not present

## 2018-02-16 DIAGNOSIS — Z0001 Encounter for general adult medical examination with abnormal findings: Secondary | ICD-10-CM | POA: Diagnosis not present

## 2018-02-16 DIAGNOSIS — J449 Chronic obstructive pulmonary disease, unspecified: Secondary | ICD-10-CM | POA: Diagnosis not present

## 2018-02-16 DIAGNOSIS — J9611 Chronic respiratory failure with hypoxia: Secondary | ICD-10-CM | POA: Diagnosis not present

## 2018-02-16 DIAGNOSIS — Z0000 Encounter for general adult medical examination without abnormal findings: Secondary | ICD-10-CM | POA: Diagnosis not present

## 2018-02-16 DIAGNOSIS — E119 Type 2 diabetes mellitus without complications: Secondary | ICD-10-CM | POA: Diagnosis not present

## 2018-03-05 ENCOUNTER — Encounter: Payer: Self-pay | Admitting: Radiology

## 2018-03-05 ENCOUNTER — Ambulatory Visit
Admission: RE | Admit: 2018-03-05 | Discharge: 2018-03-05 | Disposition: A | Discharge status: Home / Self Care | Payer: PPO | Source: Ambulatory Visit | Attending: Internal Medicine | Admitting: Internal Medicine

## 2018-03-05 DIAGNOSIS — Z1231 Encounter for screening mammogram for malignant neoplasm of breast: Secondary | ICD-10-CM | POA: Diagnosis not present

## 2018-03-09 DIAGNOSIS — I10 Essential (primary) hypertension: Secondary | ICD-10-CM | POA: Diagnosis not present

## 2018-03-09 DIAGNOSIS — Z01810 Encounter for preprocedural cardiovascular examination: Secondary | ICD-10-CM | POA: Diagnosis not present

## 2018-03-10 ENCOUNTER — Other Ambulatory Visit: Payer: Self-pay | Admitting: Internal Medicine

## 2018-03-10 DIAGNOSIS — N6310 Unspecified lump in the right breast, unspecified quadrant: Secondary | ICD-10-CM

## 2018-03-10 DIAGNOSIS — R928 Other abnormal and inconclusive findings on diagnostic imaging of breast: Secondary | ICD-10-CM

## 2018-03-11 ENCOUNTER — Ambulatory Visit
Admission: RE | Admit: 2018-03-11 | Discharge: 2018-03-11 | Disposition: A | Discharge status: Home / Self Care | Payer: PPO | Source: Ambulatory Visit | Attending: Internal Medicine | Admitting: Internal Medicine

## 2018-03-11 DIAGNOSIS — N6489 Other specified disorders of breast: Secondary | ICD-10-CM | POA: Diagnosis not present

## 2018-03-11 DIAGNOSIS — R928 Other abnormal and inconclusive findings on diagnostic imaging of breast: Secondary | ICD-10-CM | POA: Diagnosis not present

## 2018-03-11 DIAGNOSIS — N6310 Unspecified lump in the right breast, unspecified quadrant: Secondary | ICD-10-CM | POA: Diagnosis not present

## 2018-03-12 DIAGNOSIS — J449 Chronic obstructive pulmonary disease, unspecified: Secondary | ICD-10-CM | POA: Diagnosis not present

## 2018-03-15 DIAGNOSIS — J449 Chronic obstructive pulmonary disease, unspecified: Secondary | ICD-10-CM | POA: Diagnosis not present

## 2018-03-16 ENCOUNTER — Other Ambulatory Visit: Payer: Self-pay | Admitting: Internal Medicine

## 2018-03-16 DIAGNOSIS — N6310 Unspecified lump in the right breast, unspecified quadrant: Secondary | ICD-10-CM

## 2018-03-16 DIAGNOSIS — R928 Other abnormal and inconclusive findings on diagnostic imaging of breast: Secondary | ICD-10-CM

## 2018-03-19 ENCOUNTER — Ambulatory Visit
Admission: RE | Admit: 2018-03-19 | Discharge: 2018-03-19 | Disposition: A | Discharge status: Home / Self Care | Payer: PPO | Source: Ambulatory Visit | Attending: Internal Medicine | Admitting: Internal Medicine

## 2018-03-19 DIAGNOSIS — N6310 Unspecified lump in the right breast, unspecified quadrant: Secondary | ICD-10-CM

## 2018-03-19 DIAGNOSIS — N63 Unspecified lump in unspecified breast: Secondary | ICD-10-CM | POA: Diagnosis not present

## 2018-03-19 DIAGNOSIS — R928 Other abnormal and inconclusive findings on diagnostic imaging of breast: Secondary | ICD-10-CM

## 2018-03-19 DIAGNOSIS — C50811 Malignant neoplasm of overlapping sites of right female breast: Secondary | ICD-10-CM | POA: Diagnosis not present

## 2018-03-19 DIAGNOSIS — N6311 Unspecified lump in the right breast, upper outer quadrant: Secondary | ICD-10-CM | POA: Diagnosis not present

## 2018-03-19 DIAGNOSIS — C50211 Malignant neoplasm of upper-inner quadrant of right female breast: Secondary | ICD-10-CM | POA: Diagnosis not present

## 2018-03-19 DIAGNOSIS — N6312 Unspecified lump in the right breast, upper inner quadrant: Secondary | ICD-10-CM | POA: Diagnosis not present

## 2018-03-19 HISTORY — PX: BREAST BIOPSY: SHX20

## 2018-03-20 ENCOUNTER — Other Ambulatory Visit: Payer: Self-pay | Admitting: Internal Medicine

## 2018-03-24 ENCOUNTER — Other Ambulatory Visit: Payer: Self-pay

## 2018-03-24 DIAGNOSIS — C50919 Malignant neoplasm of unspecified site of unspecified female breast: Secondary | ICD-10-CM

## 2018-03-25 ENCOUNTER — Other Ambulatory Visit: Payer: Self-pay | Admitting: Anatomic Pathology & Clinical Pathology

## 2018-03-25 LAB — SURGICAL PATHOLOGY

## 2018-03-26 ENCOUNTER — Inpatient Hospital Stay: Payer: PPO | Admitting: Oncology

## 2018-03-27 ENCOUNTER — Other Ambulatory Visit: Payer: Self-pay

## 2018-03-27 NOTE — Progress Notes (Signed)
  Oncology Nurse Navigator Documentation  Navigator Location: [facility] (03/26/18 1500)   )Navigator Encounter Type: Introductory phone call;Other (03/26/18 1500)   Abnormal Finding Date: 03/11/18 (03/26/18 1500) Confirmed Diagnosis Date: 03/19/18 (03/26/18 1500)               Patient Visit Type: Initial (03/26/18 1500) Treatment Phase: Pre-Tx/Tx Discussion (03/26/18 1500) Barriers/Navigation Needs: Coordination of Care;Education (03/26/18 1500) Education: Accessing Care/ Finding Providers;Coping with Diagnosis/ Prognosis;Newly Diagnosed Cancer Education (03/26/18 1500) Interventions: Coordination of Care;Education (03/26/18 1500)   Coordination of Care: Appts (03/26/18 1500) Education Method: Written;Verbal;Teach-back (03/26/18 1500)                  Oncology Nurse Navigator Documentation  Navigator Location: [facility] (03/26/18 1500)   )Navigator Encounter Type: Introductory phone call;Other (03/26/18 1500)   Abnormal Finding Date: 03/11/18 (03/26/18 1500) Confirmed Diagnosis Date: 03/19/18 (03/26/18 1500)               Patient Visit Type: Initial (03/26/18 1500) Treatment Phase: Pre-Tx/Tx Discussion (03/26/18 1500) Barriers/Navigation Needs: Coordination of Care;Education (03/26/18 1500) Education: Accessing Care/ Finding Providers;Coping with Diagnosis/ Prognosis;Newly Diagnosed Cancer Education (03/26/18 1500) Interventions: Coordination of Care;Education (03/26/18 1500)   Coordination of Care: Appts (03/26/18 1500) Education Method: Written;Verbal;Teach-back (03/26/18 1500)                Time Spent with Patient: 60 (03/26/18 1500)      Phoned patient to introduce navigation.  Patient has limited times for appointments due to husband receiving physical therapy on Tuesdays, and Thursdays.  Scheduled appointments with Dr. Tasia Catchings, and Dr Lysle Pearl for Monday 03/30/18.  Took Breast Cancer Treatment Handbook to patient.

## 2018-03-30 ENCOUNTER — Inpatient Hospital Stay: Payer: PPO | Attending: Oncology | Admitting: Oncology

## 2018-03-30 ENCOUNTER — Encounter: Payer: Self-pay | Admitting: Oncology

## 2018-03-30 ENCOUNTER — Other Ambulatory Visit: Payer: Self-pay

## 2018-03-30 ENCOUNTER — Inpatient Hospital Stay: Payer: PPO

## 2018-03-30 VITALS — BP 132/59 | HR 79 | Temp 98.1°F | Resp 16

## 2018-03-30 DIAGNOSIS — J9611 Chronic respiratory failure with hypoxia: Secondary | ICD-10-CM | POA: Diagnosis not present

## 2018-03-30 DIAGNOSIS — C50811 Malignant neoplasm of overlapping sites of right female breast: Secondary | ICD-10-CM

## 2018-03-30 DIAGNOSIS — Z170 Estrogen receptor positive status [ER+]: Secondary | ICD-10-CM | POA: Insufficient documentation

## 2018-03-30 DIAGNOSIS — D0511 Intraductal carcinoma in situ of right breast: Secondary | ICD-10-CM | POA: Diagnosis not present

## 2018-03-30 DIAGNOSIS — C50919 Malignant neoplasm of unspecified site of unspecified female breast: Secondary | ICD-10-CM

## 2018-03-30 DIAGNOSIS — J449 Chronic obstructive pulmonary disease, unspecified: Secondary | ICD-10-CM | POA: Diagnosis not present

## 2018-03-30 DIAGNOSIS — C50411 Malignant neoplasm of upper-outer quadrant of right female breast: Secondary | ICD-10-CM | POA: Diagnosis not present

## 2018-03-30 LAB — COMPREHENSIVE METABOLIC PANEL
ALBUMIN: 4.5 g/dL (ref 3.5–5.0)
ALK PHOS: 51 U/L (ref 38–126)
ALT: 19 U/L (ref 0–44)
AST: 21 U/L (ref 15–41)
Anion gap: 6 (ref 5–15)
BUN: 15 mg/dL (ref 8–23)
CALCIUM: 9.6 mg/dL (ref 8.9–10.3)
CO2: 37 mmol/L — AB (ref 22–32)
Chloride: 99 mmol/L (ref 98–111)
Creatinine, Ser: 0.56 mg/dL (ref 0.44–1.00)
GFR calc Af Amer: >60 mL/min (ref >60)
GFR calc non Af Amer: >60 mL/min (ref >60)
Glucose, Bld: 100 mg/dL — ABNORMAL HIGH (ref 70–99)
Potassium: 4.2 mmol/L (ref 3.5–5.1)
SODIUM: 142 mmol/L (ref 135–145)
Total Bilirubin: 0.4 mg/dL (ref 0.3–1.2)
Total Protein: 7.8 g/dL (ref 6.5–8.1)

## 2018-03-30 LAB — CBC WITH DIFFERENTIAL/PLATELET
BASOS PCT: 1 %
Basophils Absolute: 0.1 10*3/uL (ref 0–0.1)
EOS PCT: 5 %
Eosinophils Absolute: 0.4 10*3/uL (ref 0–0.7)
HEMATOCRIT: 42.7 % (ref 35.0–47.0)
Hemoglobin: 14 g/dL (ref 12.0–16.0)
Lymphocytes Relative: 34 %
Lymphs Abs: 2.4 10*3/uL (ref 1.0–3.6)
MCH: 28 pg (ref 26.0–34.0)
MCHC: 32.9 g/dL (ref 32.0–36.0)
MCV: 85.2 fL (ref 80.0–100.0)
MONO ABS: 0.8 10*3/uL (ref 0.2–0.9)
MONOS PCT: 12 %
NEUTROS ABS: 3.4 10*3/uL (ref 1.4–6.5)
Neutrophils Relative %: 48 %
PLATELETS: 247 10*3/uL (ref 150–440)
RBC: 5.01 MIL/uL (ref 3.80–5.20)
RDW: 14.1 % (ref 11.5–14.5)
WBC: 7.1 10*3/uL (ref 3.6–11.0)

## 2018-03-30 NOTE — Progress Notes (Signed)
Hematology/Oncology Consult note [facility] [phone] [phone]   Patient Care Team: Baxter Hire, MD as PCP - General (Internal Medicine)  REFERRING PROVIDER: Webb Silversmith shaver CHIEF COMPLAINTS/REASON FOR VISIT:  Evaluation of breast cancer  HISTORY OF PRESENTING ILLNESS:  Amy Woodard is a  77 y.o.  female with PMH listed below who was referred to me for evaluation of newly diagnosed breast cancer. Patient had mammogram and ultrasound on 03/19/2018 which showed right breast 12:00 1.6 x 1.3 x 1.5 cm mass, no radiographically enlarged adenopathy.  Patient also report feeling a mass for the past couple of months. Biopsy pathology showed: Invasive mammary carcinoma, no special type, grade 3, DCIS present, lymphovascular invasion not identified, ER PR 90% positive, HER-2 negative  Nipple discharge: Denies Family history: Mother had melanoma, passed away at age of 15, sister had cervical cancer.  Sister with colon cancer, maternal grandmother sister OCP use: denies.  Estrogen and progesterone therapy: denies History of radiation to chest: denies.  Previous breast surgery: Denies  Patient has a history of chronic respiratory failure/ COPD, on home oxygen.  Review of Systems  Constitutional: Negative for chills, fever, malaise/fatigue and weight loss.  HENT: Negative for nosebleeds and sore throat.   Eyes: Negative for double vision, photophobia and redness.  Respiratory: Positive for shortness of breath. Negative for cough and wheezing.   Cardiovascular: Negative for chest pain, palpitations and orthopnea.  Gastrointestinal: Negative for abdominal pain, blood in stool, nausea and vomiting.  Genitourinary: Negative for dysuria.  Musculoskeletal: Negative for back pain, myalgias and neck pain.  Skin: Negative for itching and rash.  Neurological: Negative for dizziness, tingling and tremors.  Endo/Heme/Allergies: Negative for environmental  allergies. Does not bruise/bleed easily.  Psychiatric/Behavioral: Negative for depression.    MEDICAL HISTORY:  Past Medical History:  Diagnosis Date  . Allergy   . COPD (chronic obstructive pulmonary disease) ([place])     SURGICAL HISTORY: Past Surgical History:  Procedure Laterality Date  . APPENDECTOMY      SOCIAL HISTORY: Social History   Socioeconomic History  . Marital status: Married    Spouse name: Not on file  . Number of children: Not on file  . Years of education: Not on file  . Highest education level: Not on file  Occupational History  . Not on file  Social Needs  . Financial resource strain: Not on file  . Food insecurity:    Worry: Not on file    Inability: Not on file  . Transportation needs:    Medical: Not on file    Non-medical: Not on file  Tobacco Use  . Smoking status: Former Smoker    Last attempt to quit: 03/30/1993    Years since quitting: 25.0  . Smokeless tobacco: Never Used  Substance and Sexual Activity  . Alcohol use: No  . Drug use: Not on file  . Sexual activity: Not on file  Lifestyle  . Physical activity:    Days per week: Not on file    Minutes per session: Not on file  . Stress: Not on file  Relationships  . Social connections:    Talks on phone: Not on file    Gets together: Not on file    Attends religious service: Not on file    Active member of club or organization: Not on file    Attends meetings of clubs or organizations: Not on file    Relationship status: Not on file  . Intimate partner  violence:    Fear of current or ex partner: Not on file    Emotionally abused: Not on file    Physically abused: Not on file    Forced sexual activity: Not on file  Other Topics Concern  . Not on file  Social History Narrative  . Not on file    FAMILY HISTORY: Family History  Problem Relation Age of Onset  . Cancer Mother        Melanoma  . Clotting disorder Mother   . Cancer Sister        Cervical Cancer     ALLERGIES:  is allergic to sulfa antibiotics.  MEDICATIONS:  Current Outpatient Medications  Medication Sig Dispense Refill  . budesonide-formoterol (SYMBICORT) 160-4.5 MCG/ACT inhaler Inhale into the lungs.    . folic acid (FOLVITE) 1 MG tablet TAKE 1 TABLET (1,000 MCG TOTAL) BY MOUTH ONCE DAILY  2  . glipiZIDE (GLUCOTROL) 5 MG tablet Take 5 mg by mouth daily before breakfast.    . hydroxychloroquine (PLAQUENIL) 200 MG tablet Take 200 mg by mouth daily.    . Ipratropium-Albuterol (COMBIVENT) 20-100 MCG/ACT AERS respimat Inhale 1 puff into the lungs every 6 (six) hours.    [name] levothyroxine (SYNTHROID, LEVOTHROID) 50 MCG tablet Take 50 mcg by mouth daily before breakfast.    . traMADol (ULTRAM) 50 MG tablet Take 1 tablet (50 mg total) by mouth every 6 (six) hours as needed. (Patient not taking: Reported on 03/30/2018) 15 tablet 0   No current facility-administered medications for this visit.      PHYSICAL EXAMINATION: ECOG PERFORMANCE STATUS: 1 - Symptomatic but completely ambulatory Vitals:   03/30/18 1501  BP: (!) 132/59  Pulse: 79  Resp: 16  Temp: 98.1 F (36.7 C)   There were no vitals filed for this visit.  Physical Exam  Constitutional: She is oriented to person, place, and time. She appears well-developed and well-nourished. No distress.  HENT:  Head: Normocephalic and atraumatic.  Right Ear: External ear normal.  Left Ear: External ear normal.  Mouth/Throat: Oropharynx is clear and moist.  Eyes: Pupils are equal, round, and reactive to light. Conjunctivae and EOM are normal. No scleral icterus.  Neck: Normal range of motion. Neck supple.  Cardiovascular: Normal rate, regular rhythm and normal heart sounds.  Pulmonary/Chest: Effort normal. No respiratory distress. She has no wheezes. She has no rales. She exhibits no tenderness.  Decreased breath sounds bilaterally  Abdominal: Soft. Bowel sounds are normal. She exhibits no distension and no mass. There is no  tenderness.  Musculoskeletal: Normal range of motion. She exhibits no edema or deformity.  Lymphadenopathy:    She has no cervical adenopathy.  Neurological: She is alert and oriented to person, place, and time. No cranial nerve deficit. Coordination normal.  Skin: Skin is warm and dry. No rash noted.  Psychiatric: She has a normal mood and affect. Her behavior is normal. Thought content normal.   Breast exam was performed in seated and lying down position. Right breast 12:00 palpable mass, about 1.5cm.  No palpable breast mass on the left. No palpable axillary lymphadenopathy bilaterally.   LABORATORY DATA:  I have reviewed the data as listed Lab Results  Component Value Date   WBC 7.1 03/30/2018   HGB 14.0 03/30/2018   HCT 42.7 03/30/2018   MCV 85.2 03/30/2018   PLT 247 03/30/2018   Recent Labs    03/30/18 1551  NA 142  K 4.2  CL 99  CO2 37*  GLUCOSE  100*  BUN 15  CREATININE 0.56  CALCIUM 9.6  GFRNONAA >60  GFRAA >60  PROT 7.8  ALBUMIN 4.5  AST 21  ALT 19  ALKPHOS 51  BILITOT 0.4   Iron/TIBC/Ferritin/ %Sat No results found for: IRON, TIBC, FERRITIN, IRONPCTSAT      ASSESSMENT & PLAN:  1. Invasive carcinoma of breast ([place])   2. Estrogen receptor positive status (ER+)    Mammogram image was independently reviewed and discussed with patient pathology report was explained in detail to patient Discussed with patient about breast cancer diagnosis, extent of disease and management plan. Clinically she has cT1c c N0 hormone receptor positive breast cancer. Recommend lumpectomy with sentinel lymph node biopsy. If lymph node negative, plan sending Oncotype DX to further determine chemotherapy benefits. Further management plan pending on final pathology report.  Discussed with patient. Check baseline labs. Orders Placed This Encounter  Procedures  . CBC with Differential/Platelet    Standing Status:   Future    Number of Occurrences:   1    Standing Expiration  Date:   03/31/2019  . Comprehensive metabolic panel    Standing Status:   Future    Number of Occurrences:   1    Standing Expiration Date:   03/31/2019  . Cancer antigen 15-3    Standing Status:   Future    Number of Occurrences:   1    Standing Expiration Date:   03/31/2019  . Cancer antigen 27.29    Standing Status:   Future    Number of Occurrences:   1    Standing Expiration Date:   03/31/2019    All questions were answered. The patient knows to call the clinic with any problems questions or concerns.  Return of visit: To be determined. Thank you for this kind referral and the opportunity to participate in the care of this patient. A copy of today's note is routed to referring provider  Total face to face encounter time for this patient visit was 60 min. >50% of the time was  spent in counseling and coordination of care.    Earlie Server, MD, PhD Hematology Oncology [facility] Pager- [phone] 03/30/2018

## 2018-03-31 LAB — CANCER ANTIGEN 27.29: CAN 27.29: 12 U/mL (ref 0.0–38.6)

## 2018-03-31 LAB — CANCER ANTIGEN 15-3: CA 15-3: 8.2 U/mL (ref 0.0–25.0)

## 2018-04-06 DIAGNOSIS — J449 Chronic obstructive pulmonary disease, unspecified: Secondary | ICD-10-CM | POA: Diagnosis not present

## 2018-04-06 DIAGNOSIS — C50911 Malignant neoplasm of unspecified site of right female breast: Secondary | ICD-10-CM | POA: Diagnosis not present

## 2018-04-06 DIAGNOSIS — Z01818 Encounter for other preprocedural examination: Secondary | ICD-10-CM | POA: Diagnosis not present

## 2018-04-07 ENCOUNTER — Other Ambulatory Visit: Payer: Self-pay | Admitting: Surgery

## 2018-04-07 DIAGNOSIS — C50411 Malignant neoplasm of upper-outer quadrant of right female breast: Secondary | ICD-10-CM

## 2018-04-09 ENCOUNTER — Ambulatory Visit: Payer: Self-pay | Admitting: Surgery

## 2018-04-09 ENCOUNTER — Encounter
Admission: RE | Admit: 2018-04-09 | Discharge: 2018-04-09 | Disposition: A | Discharge status: Home / Self Care | Payer: PPO | Source: Ambulatory Visit | Attending: Surgery | Admitting: Surgery

## 2018-04-09 ENCOUNTER — Other Ambulatory Visit: Payer: Self-pay

## 2018-04-09 DIAGNOSIS — Z9981 Dependence on supplemental oxygen: Secondary | ICD-10-CM | POA: Diagnosis not present

## 2018-04-09 DIAGNOSIS — I10 Essential (primary) hypertension: Secondary | ICD-10-CM | POA: Insufficient documentation

## 2018-04-09 HISTORY — DX: Malignant neoplasm of unspecified site of unspecified female breast: C50.919

## 2018-04-09 HISTORY — DX: Type 2 diabetes mellitus without complications: E11.9

## 2018-04-09 HISTORY — DX: Hypothyroidism, unspecified: E03.9

## 2018-04-09 HISTORY — DX: Dependence on supplemental oxygen: Z99.81

## 2018-04-09 MED ORDER — CHLORHEXIDINE GLUCONATE CLOTH 2 % EX PADS
6.0000 | MEDICATED_PAD | Freq: Once | CUTANEOUS | Status: DC
  Filled 2018-04-09: qty 6

## 2018-04-09 NOTE — H&P (Signed)
Subjective:   CC: Malignant neoplasm of upper-outer quadrant of right breast in female, estrogen receptor positive (CMS-HCC) [C50.411, Z17.0] HPI:  Amy Woodard is a 77 y.o. female who was referred by John David Johnston, MD for evaluation of breast mass. Screening mammography noted palpable abnormal mass at 12 oclock position with subsequent biopsy noting ductal CA with DCIS ER/PR+ HER-2 neg.  No axillary lymphadenopathy on imaging.  Age of menarche was 13. Age of menopause was unknown.  Patient denies hormonal therapy. Patient is G2P2. Patient denies nipple discharge. Patient denies previous breast biopsy. Patient denies a personal history of breast cancer.   Past Medical History:  has a past medical history of Allergic state, COPD (chronic obstructive pulmonary disease) (CMS-HCC), Family history of colon cancer, and GERD (gastroesophageal reflux disease).  Past Surgical History:  has a past surgical history that includes Appendectomy; Spine surgery; and Hemorrhoidectomy By Simple Ligation.  Family History: family history includes Alcohol abuse in her father; Colon cancer in her mother; Lung cancer in her sister; Ovarian cancer in her other; Stroke in her father and mother; Uterine cancer in her sister.  Social History:  reports that she quit smoking about 29 years ago. Her smoking use included cigarettes. She has a 31.00 pack-year smoking history. She has never used smokeless tobacco. She reports that she does not drink alcohol or use drugs.  Current Medications: has a current medication list which includes the following prescription(s): budesonide-formoterol, folic acid, glipizide, hydroxychloroquine, ipratropium-albuterol, levothyroxine, tizanidine, and azelastine.  Allergies:       Allergies as of 03/30/2018 - Reviewed 03/30/2018  Allergen Reaction Noted  . Sulfa (sulfonamide antibiotics) Itching and Rash 01/08/2014  . Sulfasalazine Itching 12/12/2016    ROS:  A 15 point  review of systems was performed and was negative except as noted in HPI   Objective:   BP 127/61   Pulse 86   Temp 36.9 C (98.5 F) (Oral)   Ht 175.3 cm (5' 9")   Wt 74.4 kg (164 lb)   BMI 24.22 kg/m   Constitutional :  alert, appears stated age, cooperative and no distress  Lymphatics/Throat:  no asymmetry, masses, or scars  Respiratory:  clear to auscultation bilaterally  Cardiovascular:  regular rate and rhythm, S1, S2 normal, no murmur, click, rub or gallop  Gastrointestinal: soft, non-tender; bowel sounds normal; no masses,  no organomegaly.   Musculoskeletal: Steady gait and movement  Skin: Cool and moist,   Psychiatric: Normal affect, non-agitated, not confused  Breast:  Chaperone present for exam.  left breast normal without mass, skin or nipple changes or axillary nodes, abnormal mass palpable on right at 12 oclock position with no overyling skin changes, no axillary lymphadenopathy.    LABS:  -      Lab Results  Component Value Date   WBC 5.7 02/09/2018   HGB 14.0 02/09/2018   HCT 45.1 02/09/2018   PLT 247 02/09/2018   -      Lab Results  Component Value Date   NA 142 02/09/2018   K 4.4 02/09/2018   CL 100 02/09/2018   CO2 37.3 (H) 02/09/2018   BUN 10 02/09/2018   CREATININE 0.6 02/09/2018   GLUCOSE 158 (H) 02/09/2018     RADS: See scanned copies  Assessment:   Malignant neoplasm of upper-outer quadrant of right breast in female, estrogen receptor positive (CMS-HCC) [C50.411, Z17.0]  Plan:   1. Malignant neoplasm of upper-outer quadrant of right breast in female, estrogen receptor positive (CMS-HCC) [C50.411, Z17.0]    Discussed the risk of surgery including recurrence, chronic pain, post-op infxn, poor/delayed wound healing, poor cosmesis, seroma, hematoma formation, and possible re-operation to address said risks. The risks of general anesthetic, if used, includes MI, CVA, sudden death or even reaction to anesthetic medications also  discussed.  Typical post-op recovery time and possbility of activity restrictions were also discussed.  Alternatives include continued observation.  Benefits include possible symptom relief, pathologic evaluation, and/or curative excision.   The patient verbalized understanding and all questions were answered to the patient's satisfaction.  2. Patient has elected to proceed with surgical treatment once we obtain medical clearance for her oxygen dependent COPD status. and confirm no further pre-operative workup needed from onc standpoint  Procedure will be scheduled.  Written consent was obtained.     Electronically signed by Derra Shartzer, DO on 03/31/2018 3:41 PM     

## 2018-04-09 NOTE — Patient Instructions (Addendum)
Your procedure is scheduled on: 04/16/18 Thurs Report to [facility] @ 7:45 AM  Remember: Instructions that are not followed completely may result in serious medical risk, up to and including death, or upon the discretion of your surgeon and anesthesiologist your surgery may need to be rescheduled.    _x___ 1. Do not eat food after midnight the night before your procedure. You may drink clear liquids up to 2 hours before you are scheduled to arrive at the hospital for your procedure.  Do not drink clear liquids within 2 hours of your scheduled arrival to the hospital.  Clear liquids include  --Water or Apple juice without pulp  --Clear carbohydrate beverage such as ClearFast or Gatorade  --Black Coffee or Clear Tea (No milk, no creamers, do not add anything to                  the coffee or Tea Type 1 and type 2 diabetics should only drink water.   ____Ensure clear carbohydrate drink on the way to the hospital for bariatric patients  ____Ensure clear carbohydrate drink 3 hours before surgery for Dr Dwyane Luo patients if physician instructed.   No gum chewing or hard candies.     __x__ 2. No Alcohol for 24 hours before or after surgery.   __x__3. No Smoking or e-cigarettes for 24 prior to surgery.  Do not use any chewable tobacco products for at least 6 hour prior to surgery   ____  4. Bring all medications with you on the day of surgery if instructed.    __x__ 5. Notify your doctor if there is any change in your medical condition     (cold, fever, infections).    x___6. On the morning of surgery brush your teeth with toothpaste and water.  You may rinse your mouth with mouth wash if you wish.  Do not swallow any toothpaste or mouthwash.   Do not wear jewelry, make-up, hairpins, clips or nail polish.  Do not wear lotions, powders, or perfumes. You may wear deodorant.  Do not shave 48 hours prior to surgery. Men may shave face and neck.  Do not bring valuables to the hospital.     [facility] is not responsible for any belongings or valuables.               Contacts, dentures or bridgework may not be worn into surgery.  Leave your suitcase in the car. After surgery it may be brought to your room.  For patients admitted to the hospital, discharge time is determined by your                       treatment team.  _  Patients discharged the day of surgery will not be allowed to drive home.  You will need someone to drive you home and stay with you the night of your procedure.    Please read over the following fact sheets that you were given:   [facility] Preparing for Surgery and or MRSA Information   _x___ Take anti-hypertensive listed below, cardiac, seizure, asthma,     anti-reflux and psychiatric medicines. These include:  1. azelastine (ASTELIN) 0.1 % nasal spray  2.budesonide-formoterol (SYMBICORT) 160-4.5 MCG/ACT inhaler  3.hydroxychloroquine (PLAQUENIL) 200 MG tablet  4.Ipratropium-Albuterol (COMBIVENT) 20-100 MCG/ACT AERS respimat  5.levothyroxine (SYNTHROID, LEVOTHROID) 50 MCG tablet  6.  ____Fleets enema or Magnesium Citrate as directed.   _x___ Use CHG Soap or sage wipes as directed on  instruction sheet   ____ Use inhalers on the day of surgery and bring to hospital day of surgery  ____ Stop Metformin and Janumet 2 days prior to surgery.    ____ Take 1/2 of usual insulin dose the night before surgery and none on the morning     surgery.   _x___ Follow recommendations from Cardiologist, Pulmonologist or PCP regarding          stopping Aspirin, Coumadin, Plavix ,Eliquis, Effient, or Pradaxa, and Pletal.  X____Stop Anti-inflammatories such as Advil, Aleve, Ibuprofen, Motrin, Naproxen, Naprosyn, Goodies powders or aspirin products. OK to take Tylenol and                          Celebrex.   _x___ Stop supplements until after surgery.  But may continue Vitamin D, Vitamin B,       and multivitamin.   ____ Bring C-Pap to the hospital.

## 2018-04-09 NOTE — H&P (Signed)
Subjective:   CC: Malignant neoplasm of upper-outer quadrant of right breast in female, estrogen receptor positive (CMS-HCC) [C50.411, Z17.0] HPI:  Amy Woodard is a 77 y.o. female who was referred by Velna Ochs, MD for evaluation of breast mass. Screening mammography noted palpable abnormal mass at 12 oclock position with subsequent biopsy noting ductal CA with DCIS ER/PR+ HER-2 neg.  No axillary lymphadenopathy on imaging.  Age of menarche was 75. Age of menopause was unknown.  Patient denies hormonal therapy. Patient is G2P2. Patient denies nipple discharge. Patient denies previous breast biopsy. Patient denies a personal history of breast cancer.   Past Medical History:  has a past medical history of Allergic state, COPD (chronic obstructive pulmonary disease) (CMS-HCC), Family history of colon cancer, and GERD (gastroesophageal reflux disease).  Past Surgical History:  has a past surgical history that includes Appendectomy; Spine surgery; and Hemorrhoidectomy By Simple Ligation.  Family History: family history includes Alcohol abuse in her father; Colon cancer in her mother; Lung cancer in her sister; Ovarian cancer in her other; Stroke in her father and mother; Uterine cancer in her sister.  Social History:  reports that she quit smoking about 29 years ago. Her smoking use included cigarettes. She has a 31.00 pack-year smoking history. She has never used smokeless tobacco. She reports that she does not drink alcohol or use drugs.  Current Medications: has a current medication list which includes the following prescription(s): budesonide-formoterol, folic acid, glipizide, hydroxychloroquine, ipratropium-albuterol, levothyroxine, tizanidine, and azelastine.  Allergies:       Allergies as of 03/30/2018 - Reviewed 03/30/2018  Allergen Reaction Noted  . Sulfa (sulfonamide antibiotics) Itching and Rash 01/08/2014  . Sulfasalazine Itching 12/12/2016    ROS:  A 15 point  review of systems was performed and was negative except as noted in HPI   Objective:   BP 127/61   Pulse 86   Temp 36.9 C (98.5 F) (Oral)   Ht 175.3 cm (_0 )   Wt 74.4 kg (164 lb)   BMI 24.22 kg/m   Constitutional :  alert, appears stated age, cooperative and no distress  Lymphatics/Throat:  no asymmetry, masses, or scars  Respiratory:  clear to auscultation bilaterally  Cardiovascular:  regular rate and rhythm, S1, S2 normal, no murmur, click, rub or gallop  Gastrointestinal: soft, non-tender; bowel sounds normal; no masses,  no organomegaly.   Musculoskeletal: Steady gait and movement  Skin: Cool and moist,   Psychiatric: Normal affect, non-agitated, not confused  Breast:  Chaperone present for exam.  left breast normal without mass, skin or nipple changes or axillary nodes, abnormal mass palpable on right at 12 oclock position with no overyling skin changes, no axillary lymphadenopathy.    LABS:  -      Lab Results  Component Value Date   WBC 5.7 02/09/2018   HGB 14.0 02/09/2018   HCT 45.1 02/09/2018   PLT 247 02/09/2018   -      Lab Results  Component Value Date   NA 142 02/09/2018   K 4.4 02/09/2018   CL 100 02/09/2018   CO2 37.3 (H) 02/09/2018   BUN 10 02/09/2018   CREATININE 0.6 02/09/2018   GLUCOSE 158 (H) 02/09/2018     RADS: See scanned copies  Assessment:   Malignant neoplasm of upper-outer quadrant of right breast in female, estrogen receptor positive (CMS-HCC) [C50.411, Z17.0]  Plan:   1. Malignant neoplasm of upper-outer quadrant of right breast in female, estrogen receptor positive (CMS-HCC) [C50.411, Z17.0]  Discussed the risk of surgery including recurrence, chronic pain, post-op infxn, poor/delayed wound healing, poor cosmesis, seroma, hematoma formation, and possible re-operation to address said risks. The risks of general anesthetic, if used, includes MI, CVA, sudden death or even reaction to anesthetic medications also  discussed.  Typical post-op recovery time and possbility of activity restrictions were also discussed.  Alternatives include continued observation.  Benefits include possible symptom relief, pathologic evaluation, and/or curative excision.   The patient verbalized understanding and all questions were answered to the patient's satisfaction.  2. Patient has elected to proceed with surgical treatment once we obtain medical clearance for her oxygen dependent COPD status. and confirm no further pre-operative workup needed from onc standpoint  Procedure will be scheduled.  Written consent was obtained.     Electronically signed by Benjamine Sprague, DO on 03/31/2018 3:41 PM

## 2018-04-09 NOTE — Pre-Procedure Instructions (Signed)
Pulmonary clearance, Dr Raul Del, on chart.

## 2018-04-10 ENCOUNTER — Other Ambulatory Visit: Payer: Self-pay | Admitting: Surgery

## 2018-04-10 DIAGNOSIS — C50411 Malignant neoplasm of upper-outer quadrant of right female breast: Secondary | ICD-10-CM

## 2018-04-12 DIAGNOSIS — J449 Chronic obstructive pulmonary disease, unspecified: Secondary | ICD-10-CM | POA: Diagnosis not present

## 2018-04-15 DIAGNOSIS — J449 Chronic obstructive pulmonary disease, unspecified: Secondary | ICD-10-CM | POA: Diagnosis not present

## 2018-04-15 MED ORDER — CEFAZOLIN SODIUM-DEXTROSE 2-4 GM/100ML-% IV SOLN
2.0000 g | INTRAVENOUS | Status: AC
  Administered 2018-04-16: 2 g via INTRAVENOUS

## 2018-04-16 ENCOUNTER — Ambulatory Visit
Admission: RE | Admit: 2018-04-16 | Discharge: 2018-04-16 | Disposition: A | Discharge status: Home / Self Care | Payer: PPO | Source: Ambulatory Visit | Attending: Surgery | Admitting: Surgery

## 2018-04-16 ENCOUNTER — Ambulatory Visit: Payer: PPO

## 2018-04-16 ENCOUNTER — Other Ambulatory Visit: Payer: Self-pay

## 2018-04-16 ENCOUNTER — Ambulatory Visit: Payer: PPO | Admitting: Certified Registered Nurse Anesthetist

## 2018-04-16 ENCOUNTER — Encounter
Admission: RE | Disposition: A | Discharge status: Home / Self Care | Payer: Self-pay | Source: Ambulatory Visit | Attending: Surgery

## 2018-04-16 ENCOUNTER — Encounter: Payer: Self-pay | Admitting: Radiology

## 2018-04-16 DIAGNOSIS — Z7984 Long term (current) use of oral hypoglycemic drugs: Secondary | ICD-10-CM | POA: Diagnosis not present

## 2018-04-16 DIAGNOSIS — C50411 Malignant neoplasm of upper-outer quadrant of right female breast: Secondary | ICD-10-CM | POA: Insufficient documentation

## 2018-04-16 DIAGNOSIS — Z79899 Other long term (current) drug therapy: Secondary | ICD-10-CM | POA: Diagnosis not present

## 2018-04-16 DIAGNOSIS — E119 Type 2 diabetes mellitus without complications: Secondary | ICD-10-CM | POA: Diagnosis not present

## 2018-04-16 DIAGNOSIS — C50811 Malignant neoplasm of overlapping sites of right female breast: Secondary | ICD-10-CM | POA: Diagnosis not present

## 2018-04-16 DIAGNOSIS — Z9981 Dependence on supplemental oxygen: Secondary | ICD-10-CM | POA: Insufficient documentation

## 2018-04-16 DIAGNOSIS — E039 Hypothyroidism, unspecified: Secondary | ICD-10-CM | POA: Diagnosis not present

## 2018-04-16 DIAGNOSIS — C50911 Malignant neoplasm of unspecified site of right female breast: Secondary | ICD-10-CM | POA: Diagnosis not present

## 2018-04-16 DIAGNOSIS — Z170 Estrogen receptor positive status [ER+]: Secondary | ICD-10-CM | POA: Insufficient documentation

## 2018-04-16 DIAGNOSIS — J449 Chronic obstructive pulmonary disease, unspecified: Secondary | ICD-10-CM | POA: Diagnosis not present

## 2018-04-16 DIAGNOSIS — K219 Gastro-esophageal reflux disease without esophagitis: Secondary | ICD-10-CM | POA: Diagnosis not present

## 2018-04-16 HISTORY — PX: BREAST LUMPECTOMY: SHX2

## 2018-04-16 HISTORY — PX: PARTIAL MASTECTOMY WITH NEEDLE LOCALIZATION AND AXILLARY SENTINEL LYMPH NODE BX: SHX6009

## 2018-04-16 LAB — GLUCOSE, CAPILLARY
GLUCOSE-CAPILLARY: 138 mg/dL — AB (ref 70–99)
Glucose-Capillary: 139 mg/dL — ABNORMAL HIGH (ref 70–99)

## 2018-04-16 SURGERY — PARTIAL MASTECTOMY WITH NEEDLE LOCALIZATION AND AXILLARY SENTINEL LYMPH NODE BX
Anesthesia: General | Laterality: Right | Wound class: Clean

## 2018-04-16 MED ORDER — TECHNETIUM TC 99M SULFUR COLLOID FILTERED
0.7480 | Freq: Once | INTRAVENOUS | Status: AC | PRN
  Administered 2018-04-16: 0.748 via INTRADERMAL

## 2018-04-16 MED ORDER — SODIUM CHLORIDE 0.9 % IV SOLN
INTRAVENOUS | Status: DC
  Administered 2018-04-16: 10:00:00 via INTRAVENOUS

## 2018-04-16 MED ORDER — IBUPROFEN 800 MG PO TABS: 800.0000 mg | ORAL_TABLET | Freq: Three times a day (TID) | ORAL | 0 refills | Status: DC | PRN

## 2018-04-16 MED ORDER — PROPOFOL 10 MG/ML IV BOLUS
INTRAVENOUS | Status: DC | PRN
  Administered 2018-04-16: 150 mg via INTRAVENOUS

## 2018-04-16 MED ORDER — LIDOCAINE HCL (PF) 1 % IJ SOLN
INTRAMUSCULAR | Status: AC
  Filled 2018-04-16: qty 30

## 2018-04-16 MED ORDER — PHENYLEPHRINE HCL 10 MG/ML IJ SOLN
INTRAMUSCULAR | Status: DC | PRN
  Administered 2018-04-16 (×2): 100 ug via INTRAVENOUS

## 2018-04-16 MED ORDER — KETOROLAC TROMETHAMINE 30 MG/ML IJ SOLN
INTRAMUSCULAR | Status: DC | PRN
  Administered 2018-04-16: 15 mg via INTRAVENOUS

## 2018-04-16 MED ORDER — HYDROCODONE-ACETAMINOPHEN 5-325 MG PO TABS: 1.0000 | ORAL_TABLET | Freq: Four times a day (QID) | ORAL | 0 refills | Status: DC | PRN

## 2018-04-16 MED ORDER — FENTANYL CITRATE (PF) 100 MCG/2ML IJ SOLN
INTRAMUSCULAR | Status: DC | PRN
  Administered 2018-04-16: 50 ug via INTRAVENOUS
  Administered 2018-04-16 (×2): 25 ug via INTRAVENOUS

## 2018-04-16 MED ORDER — ACETAMINOPHEN 10 MG/ML IV SOLN
INTRAVENOUS | Status: DC | PRN
  Administered 2018-04-16: 1000 mg via INTRAVENOUS

## 2018-04-16 MED ORDER — FENTANYL CITRATE (PF) 100 MCG/2ML IJ SOLN
INTRAMUSCULAR | Status: AC
  Filled 2018-04-16: qty 2

## 2018-04-16 MED ORDER — FAMOTIDINE 20 MG PO TABS
20.0000 mg | ORAL_TABLET | Freq: Once | ORAL | Status: AC
  Administered 2018-04-16: 20 mg via ORAL

## 2018-04-16 MED ORDER — BUPIVACAINE HCL 0.5 % IJ SOLN
INTRAMUSCULAR | Status: DC | PRN
  Administered 2018-04-16: 5 mL

## 2018-04-16 MED ORDER — OXYCODONE HCL 5 MG/5ML PO SOLN: 5.0000 mg | Freq: Once | ORAL | Status: DC | PRN

## 2018-04-16 MED ORDER — FENTANYL CITRATE (PF) 100 MCG/2ML IJ SOLN: 25.0000 ug | INTRAMUSCULAR | Status: DC | PRN

## 2018-04-16 MED ORDER — OXYCODONE HCL 5 MG PO TABS: 5.0000 mg | ORAL_TABLET | Freq: Once | ORAL | Status: DC | PRN

## 2018-04-16 MED ORDER — PROPOFOL 10 MG/ML IV BOLUS
INTRAVENOUS | Status: AC
  Filled 2018-04-16: qty 20

## 2018-04-16 MED ORDER — DEXAMETHASONE SODIUM PHOSPHATE 10 MG/ML IJ SOLN
INTRAMUSCULAR | Status: DC | PRN
  Administered 2018-04-16: 4 mg via INTRAVENOUS

## 2018-04-16 MED ORDER — ONDANSETRON HCL 4 MG/2ML IJ SOLN
INTRAMUSCULAR | Status: DC | PRN
  Administered 2018-04-16: 4 mg via INTRAVENOUS

## 2018-04-16 MED ORDER — FAMOTIDINE 20 MG PO TABS
ORAL_TABLET | ORAL | Status: AC
  Filled 2018-04-16: qty 1

## 2018-04-16 MED ORDER — DOCUSATE SODIUM 100 MG PO CAPS: 100.0000 mg | ORAL_CAPSULE | Freq: Two times a day (BID) | ORAL | 0 refills | Status: AC | PRN

## 2018-04-16 MED ORDER — CEFAZOLIN SODIUM-DEXTROSE 2-4 GM/100ML-% IV SOLN
INTRAVENOUS | Status: AC
  Filled 2018-04-16: qty 100

## 2018-04-16 MED ORDER — LIDOCAINE HCL (CARDIAC) PF 100 MG/5ML IV SOSY
PREFILLED_SYRINGE | INTRAVENOUS | Status: DC | PRN
  Administered 2018-04-16: 100 mg via INTRAVENOUS

## 2018-04-16 MED ORDER — MEPERIDINE HCL 50 MG/ML IJ SOLN: 6.2500 mg | INTRAMUSCULAR | Status: DC | PRN

## 2018-04-16 MED ORDER — PROMETHAZINE HCL 25 MG/ML IJ SOLN: 6.2500 mg | INTRAMUSCULAR | Status: DC | PRN

## 2018-04-16 MED ORDER — METHYLENE BLUE 0.5 % INJ SOLN
INTRAVENOUS | Status: AC
  Filled 2018-04-16: qty 10

## 2018-04-16 MED ORDER — LIDOCAINE HCL (PF) 2 % IJ SOLN
INTRAMUSCULAR | Status: AC
  Filled 2018-04-16: qty 10

## 2018-04-16 MED ORDER — BUPIVACAINE HCL (PF) 0.5 % IJ SOLN
INTRAMUSCULAR | Status: AC
  Filled 2018-04-16: qty 30

## 2018-04-16 SURGICAL SUPPLY — 42 items
APPLIER CLIP 11 MED OPEN (CLIP)
BLADE SURG 15 STRL LF DISP TIS (BLADE) ×1 IMPLANT
BLADE SURG 15 STRL SS (BLADE) ×2
CANISTER SUCT 1200ML W/VALVE (MISCELLANEOUS) ×3 IMPLANT
CHLORAPREP W/TINT 26ML (MISCELLANEOUS) ×3 IMPLANT
CLIP APPLIE 11 MED OPEN (CLIP) IMPLANT
CNTNR SPEC 2.5X3XGRAD LEK (MISCELLANEOUS) ×1
CONT SPEC 4OZ STER OR WHT (MISCELLANEOUS) ×2
CONTAINER SPEC 2.5X3XGRAD LEK (MISCELLANEOUS) ×1 IMPLANT
DERMABOND ADVANCED (GAUZE/BANDAGES/DRESSINGS) ×2
DERMABOND ADVANCED .7 DNX12 (GAUZE/BANDAGES/DRESSINGS) ×1 IMPLANT
DRAPE CHEST BREAST 77X106 FENE (MISCELLANEOUS) IMPLANT
DRAPE IMP U-DRAPE 54X76 (DRAPES) ×3 IMPLANT
DRAPE LAPAROTOMY 77X122 PED (DRAPES) ×3 IMPLANT
DRAPE SHEET LG 3/4 BI-LAMINATE (DRAPES) ×3 IMPLANT
ELECT REM PT RETURN 9FT ADLT (ELECTROSURGICAL) ×3
ELECTRODE REM PT RTRN 9FT ADLT (ELECTROSURGICAL) ×1 IMPLANT
GAUZE SPONGE 4X4 12PLY STRL (GAUZE/BANDAGES/DRESSINGS) ×3 IMPLANT
GLOVE BIOGEL PI IND STRL 7.0 (GLOVE) ×1 IMPLANT
GLOVE BIOGEL PI INDICATOR 7.0 (GLOVE) ×2
GLOVE SURG SYN 6.5 ES PF (GLOVE) ×6 IMPLANT
GOWN STRL REUS W/ TWL LRG LVL3 (GOWN DISPOSABLE) ×3 IMPLANT
GOWN STRL REUS W/TWL LRG LVL3 (GOWN DISPOSABLE) ×6
JACKSON PRATT 10 (INSTRUMENTS) IMPLANT
KIT TURNOVER KIT A (KITS) ×3 IMPLANT
LABEL OR SOLS (LABEL) ×3 IMPLANT
MARGIN MAP 10MM (MISCELLANEOUS) ×3 IMPLANT
NEEDLE HYPO 25X1 1.5 SAFETY (NEEDLE) ×6 IMPLANT
PACK BASIN MINOR ARMC (MISCELLANEOUS) ×3 IMPLANT
SLEVE PROBE SENORX GAMMA FIND (MISCELLANEOUS) ×3 IMPLANT
STOCKINETTE STRL 6IN 960660 (GAUZE/BANDAGES/DRESSINGS) ×3 IMPLANT
SUT MNCRL 4-0 (SUTURE) ×4
SUT MNCRL 4-0 27XMFL (SUTURE) ×2
SUT SILK 2 0 (SUTURE) ×2
SUT SILK 2-0 30XBRD TIE 12 (SUTURE) ×1 IMPLANT
SUT SILK 3 0 12 30 (SUTURE) ×3 IMPLANT
SUT VIC AB 3-0 SH 27 (SUTURE) ×4
SUT VIC AB 3-0 SH 27X BRD (SUTURE) ×2 IMPLANT
SUTURE MNCRL 4-0 27XMF (SUTURE) ×2 IMPLANT
SYR 10ML LL (SYRINGE) ×3 IMPLANT
SYR 50ML LL SCALE MARK (SYRINGE) IMPLANT
WATER STERILE IRR 1000ML POUR (IV SOLUTION) ×3 IMPLANT

## 2018-04-16 NOTE — Interval H&P Note (Signed)
History and Physical Interval Note:  04/16/2018 10:00 AM  Amy Woodard  has presented today for surgery, with the diagnosis of partialmalignant neoplasm of right breast  The various methods of treatment have been discussed with the patient and family. After consideration of risks, benefits and other options for treatment, the patient has consented to  Procedure(s): PARTIAL MASTECTOMY WITH NEEDLE LOCALIZATION AND AXILLARY SENTINEL LYMPH NODE BX (Right) as a surgical intervention .  The patient's history has been reviewed, patient examined, no change in status, stable for surgery.  I have reviewed the patient's chart and labs.  Questions were answered to the patient's satisfaction.     Gurney Balthazor Lysle Pearl

## 2018-04-16 NOTE — Anesthesia Postprocedure Evaluation (Signed)
Anesthesia Post Note  Patient: Amy Woodard  Procedure(s) Performed: PARTIAL MASTECTOMY WITH NEEDLE LOCALIZATION AND AXILLARY SENTINEL LYMPH NODE BX (Right )  Patient location during evaluation: PACU Anesthesia Type: General Level of consciousness: awake and alert and oriented Pain management: pain level controlled Vital Signs Assessment: post-procedure vital signs reviewed and stable Respiratory status: spontaneous breathing, nonlabored ventilation and respiratory function stable Cardiovascular status: blood pressure returned to baseline and stable Postop Assessment: no signs of nausea or vomiting Anesthetic complications: no     Last Vitals:  Vitals:   04/16/18 1337 04/16/18 1405  BP: (!) 130/42 (!) 131/44  Pulse: 79 85  Resp: 16 16  Temp: (!) 36.2 C   SpO2: 95% 98%    Last Pain:  Vitals:   04/16/18 1405  TempSrc:   PainSc: 0-No pain                 Branden Vine

## 2018-04-16 NOTE — Anesthesia Procedure Notes (Signed)
Procedure Name: LMA Insertion Performed by: Elverna Caffee, CRNA Pre-anesthesia Checklist: Patient identified, Patient being monitored, Timeout performed, Emergency Drugs available and Suction available Patient Re-evaluated:Patient Re-evaluated prior to induction Oxygen Delivery Method: Circle system utilized Preoxygenation: Pre-oxygenation with 100% oxygen Induction Type: IV induction Ventilation: Mask ventilation without difficulty LMA: LMA inserted LMA Size: 3.5 Tube type: Oral Number of attempts: 1 Placement Confirmation: positive ETCO2 and breath sounds checked- equal and bilateral Tube secured with: Tape Dental Injury: Teeth and Oropharynx as per pre-operative assessment        

## 2018-04-16 NOTE — Op Note (Signed)
Preoperative diagnosis:  right breast carcinoma.  Postoperative diagnosis: right .   Procedure: right needle-localized breast lumpectomy.                      right  Axillary Sentinel Lymph node biopsy  Anesthesia: GETA  Surgeon: Dr. Benjamine Sprague  Wound Classification: Clean  Indications: Patient is a 77 y.o. female with a nonpalpable right  breast mass noted on mammography with core biopsy demonstrating mammary CA requires needle-localized lumpectomy for treatment with sentinel lymph node biopsy.   Specimen: Right Breast mass, anterolateral margin, Sentinel Lymph nodes x 1   Complications: None  Estimated Blood Loss: 23mL  Findings: 1. Specimen mammography shows marker and wire in specimen 2. Pathology call refers gross examination of margins was initially positive in anteriorlateral margin, re-excision noted negative margins 3. No other palpable mass or lymph node identified.   Description of procedure: Preoperative needle localization was performed by radiology. In the nuclear medicine suite, the subareolar region was injected with Tc-99 sulfur colloid. Localization studies were reviewed. The patient was taken to the operating room and placed supine on the operating table, and after general anesthesia the right breast and axilla were prepped and draped in the usual sterile fashion. A time-out was completed verifying correct patient, procedure, site, positioning, and implant(s) and/or special equipment prior to beginning this procedure.  By comparing the localization studies with the direction and skin entry site of the needle, the probable trajectory and location of the mass was visualized. A circumareolar skin incision was planned in such a way as to minimize the amount of dissection to reach the mass.  The skin incision was made after infusion of local. Flaps were raised and the location of the wire confirmed. The wire was delivered into the wound. Sharp and blunt dissection was then  taken down to the mass in breast tissue, not adjacent to chest wall, measuring 5cm x 2.5cm x 1.5cm, including the easily palpable 1.5 x 1.5 cm x 1.5cm mass, taking care to include the entire localizing needle and a margin of grossly normal tissue. The specimen and entire localizing wire were removed. The specimen was oriented with long lateral, short superior, deep double sutures and sent to radiology with the localization studies. Confirmation was received that the entire target lesion had been resected by rads.  Pathology called back and noted positive anterolateral margins, so margins were resected and marked identically and sent for further review.  The newly resected margins were negative.  A hand-held gamma probe was used to identify the location of the hottest spot in the axilla. An incision was made around the caudal axillary hairline. Sharp and blunt Dissection was carried down to subdermal facias. The probe was placed within wound and again, the point of maximal count was found. Dissection continue until nodule was identified. The probe was placed in contact with the node and total of 3000 counts were recorded. The node was excised in its entirety. Ex vivo, the node 10sec total of 30,000 counts were recorded.  The bed of the node measured <50 counts.  No additional hot spots were identified. No clinically abnormal nodes were palpated.   Both wounds irrigated, hemostasis was achieved and both wounds closed in layers with  interrupted sutures of 3-0 Vicryl in deep dermal layer and a running subcuticular suture of Monocryl 4-0, then dressed with dermabond. The patient tolerated the procedure well and was taken to the postanesthesia care unit in stable condition. Sponge and  instrument count correct at end of procedure.

## 2018-04-16 NOTE — Anesthesia Post-op Follow-up Note (Signed)
Anesthesia QCDR form completed.        

## 2018-04-16 NOTE — Anesthesia Preprocedure Evaluation (Signed)
Anesthesia Evaluation  Patient identified by MRN, date of birth, ID band Patient awake    Reviewed: Allergy & Precautions, NPO status , Patient's Chart, lab work & pertinent test results  History of Anesthesia Complications Negative for: history of anesthetic complications  Airway Mallampati: II  TM Distance: >3 FB Neck ROM: Full    Dental  (+) Partial Upper   Pulmonary COPD,  oxygen dependent, former smoker,    breath sounds clear to auscultation- rhonchi (-) wheezing      Cardiovascular (-) hypertension(-) CAD, (-) Past MI, (-) Cardiac Stents and (-) CABG  Rhythm:Regular Rate:Normal - Systolic murmurs and - Diastolic murmurs    Neuro/Psych negative neurological ROS  negative psych ROS   GI/Hepatic negative GI ROS, Neg liver ROS,   Endo/Other  diabetes, Oral Hypoglycemic AgentsHypothyroidism   Renal/GU negative Renal ROS     Musculoskeletal negative musculoskeletal ROS (+)   Abdominal (+) - obese,   Peds  Hematology negative hematology ROS (+)   Anesthesia Other Findings Past Medical History: No date: Allergy No date: Breast cancer ([place]) No date: COPD (chronic obstructive pulmonary disease) (H[facility]) No date: Diabetes mellitus without complication (HCC) No date: Hypothyroidism No date: Oxygen dependent   Reproductive/Obstetrics                             Anesthesia Physical Anesthesia Plan  ASA: IV  Anesthesia Plan: General   Post-op Pain Management:    Induction: Intravenous  PONV Risk Score and Plan: 2 and Dexamethasone and Ondansetron  Airway Management Planned: LMA  Additional Equipment:   Intra-op Plan:   Post-operative Plan:   Informed Consent: I have reviewed the patients History and Physical, chart, labs and discussed the procedure including the risks, benefits and alternatives for the proposed anesthesia with the patient or authorized representative who has  indicated his/her understanding and acceptance.   Dental advisory given  Plan Discussed with: CRNA and Anesthesiologist  Anesthesia Plan Comments:         Anesthesia Quick Evaluation

## 2018-04-16 NOTE — Brief Op Note (Signed)
04/16/2018  12:34 PM  PATIENT:  Amy Woodard  77 y.o. female  PRE-OPERATIVE DIAGNOSIS:  partialmalignant neoplasm of right breast  POST-OPERATIVE DIAGNOSIS:  partialmalignant neoplasm of right breast  PROCEDURE:  Procedure(s): PARTIAL MASTECTOMY WITH NEEDLE LOCALIZATION AND AXILLARY SENTINEL LYMPH NODE BX (Right)  SURGEON:  Surgeon(s) and Role:    Lysle Pearl, Tiffiany Beadles, DO - Primary  PHYSICIAN ASSISTANT:   ASSISTANTS: none   ANESTHESIA:   general  EBL:  10 mL   BLOOD ADMINISTERED:none  DRAINS: none   LOCAL MEDICATIONS USED:  XYLOCAINE  and LIDOCAINE   SPECIMEN:  Source of Specimen:  sentinel lymph node.  right breast mass.  anterior margin  DISPOSITION OF SPECIMEN:  PATHOLOGY  COUNTS:  YES  TOURNIQUET:  * No tourniquets in log *  DICTATION: .Note written in [place]: Discharge to home after PACU  PATIENT DISPOSITION:  PACU - hemodynamically stable.   Delay start of Pharmacological VTE agent (>24hrs) due to surgical blood loss or risk of bleeding: not applicable  Skip Navigation     Amy Woodard [facility]   RxSearch > Patient Request    Support: [phone]   Amy Woodard, [name]  Powered by  Narx Report Resources Date: 04/16/2018  Download CSV Download PDF  Amy Woodard  Risk Indicators  NARX SCORES Narcotic 090 Sedative 040 Stimulant 000 Explanation and Guidance OVERDOSE RISK SCORE   190  (Range 000-999) Explanation and Guidance ADDITIONAL RISK INDICATORS ( 0 )  Explanation and Guidance This NarxCare report is based on search criteria supplied and the data entered by the dispensing pharmacy. For more information about any prescription, please contact the dispensing pharmacy or the prescriber. NarxCare scores and reports are intended to aid, not replace, medical decision making. None of the information presented should be used as sole justification for providing or refusing to provide medications. The information on this report is not  warranted as accurate or complete.  Graphs  RX GRAPH Narcotic Sedative Stimulant Other     All Prescribers      Timeline 04/16/2018 26m 25m 1y 2y  Prescribers 1 - Alvan Dame, M  2 - Darleen Crocker Summe  Morphine MgEq (MME)    320 200 80 0    Timeline 08/08 17m 49m 1y 2y  *Per CDC guidance, the MME conversion factors prescribed or provided as part of the medication-assisted treatment for opioid use disorder should not be used to benchmark against dosage thresholds meant for opioids prescribed for pain. Buprenorphine products have no agreed upon morphine equivalency, and as partial opioid agonists, are not expected to be associated with overdose risk in the same dose-dependent manner as doses for full agonist opioids. MME = morphine milligram equivalents. LME = Lorazepam milligram equivalents. mg = dose in milligrams.   Summary  Summary  Total Prescriptions: 2 Total Prescribers: 2 Total Pharmacies: 1  Narcotics* (excluding buprenorphine)  Current Qty: 0 Current MME/day: 0.00 30 Day Avg MME/day: 0.00  Sedatives*  Current Qty: 0 Current LME/day: 0.00 30 Day Avg LME/day: 0.00  Buprenorphine*  Current Qty: 0 Current mg/day: 0.00 30 Day Avg mg/day: 0.00  Rx Data  PRESCRIPTIONS Total Prescriptions: 2 Total Private Pay: 0 Fill Date ID Written Drug Qty Days Prescriber Rx # Pharmacy Refill Daily Dose * [place]ym[place] Type PMP 08/01/2017  1  08/01/2017  Hydrocodone-Chlorphen Er Susp  120.00 12 [address] [place]  [id number]  [facility] ([id number])  1/1 20.00 MME Comm Ins  [place] 12/12/2016  1  12/12/2016  Tramadol Hcl  64 Mg Tablet  15.00 3 Rh Sum  [id number]  [facility] ([id number])  1/1 25.00 MME Comm Ins  [place] *Per CDC guidance, the MME conversion factors prescribed or provided as part of the medication-assisted treatment for opioid use disorder should not be used to benchmark against dosage thresholds meant for opioids prescribed for pain. Buprenorphine products  have no agreed upon morphine equivalency, and as partial opioid agonists, are not expected to be associated with overdose risk in the same dose-dependent manner as doses for full agonist opioids. MME = morphine milligram equivalents. LME = Lorazepam milligram equivalents. mg = dose in milligrams.  Providers Total Providers: 2 Name [place] Phone Alinda Dooms [place] [id number] - Ronald B Annona, MD [address] [place] [address] - Pharmacies Total Pharmacies: 1 Name [place] Phone [place]. [phone]) [address] [place] [address] -  Physician (MD, DO): The information in this system may contain errors resulting from how the information was entered into the data file. Controlled Substance Reporting System staff suggest that additional independent verification with pharmacies and practitioners may sometime be prudent or necessary.   Powered By     PMP AWA?[place], [place] [address] [place]. 2019. All Rights Reserved.

## 2018-04-16 NOTE — Discharge Instructions (Addendum)
° °Breast Biopsy, Care After °These instructions give you information about caring for yourself after your procedure. Your doctor may also give you more specific instructions. Call your doctor if you have any problems or questions after your procedure. °Follow these instructions at home: °Medicines °· Take over-the-counter and prescription medicines only as told by your doctor. °· Do not drive for 24 hours if you received a sedative. °· Do not drink alcohol while taking pain medicine. °· Do not drive or use heavy machinery while taking prescription pain medicine. °Biopsy Site Care ° °· Follow instructions from your doctor about how to take care of your cut from surgery (incision) or puncture area. Make sure you: °? Wash your hands with soap and water before you change your bandage. If you cannot use soap and water, use hand sanitizer. °? Change any bandages (dressings) as told by your doctor. °? Leave any stitches (sutures), skin glue, or skin tape (adhesive) strips in place. They may need to stay in place for 2 weeks or longer. If tape strips get loose and curl up, you may trim the loose edges. Do not remove tape strips completely unless your doctor says it is okay. °· If you have stitches, keep them dry when you take a bath or a shower. °· Check your cut or puncture area every day for signs of infection. Check for: °? More redness, swelling, or pain. °? More fluid or blood. °? Warmth. °? Pus or a bad smell. °· Protect the biopsy area. Do not let the area get bumped. °Activity °· Avoid activities that could pull the biopsy site open. °? Avoid stretching. °? Avoid reaching. °? Avoid exercise. °? Avoid sports. °? Avoid lifting anything that is heavier than 3 pounds (1.4 kg). °· Return to your normal activities as told by your doctor. Ask your doctor what activities are safe for you. °General instructions °· Continue your normal diet. °· Wear a good support bra for as long as told by your doctor. °· Get checked for  extra fluid in your body (lymphedema) as often as told by your doctor. °· Keep all follow-up visits as told by your doctor. This is important. °Contact a health care provider if: °· You have more redness, swelling, or pain at the biopsy site. °· You have more fluid or blood coming from your biopsy site. °· Your biopsy site feels warm to the touch. °· You have pus or a bad smell coming from the biopsy site. °· Your biopsy site breaks open after the stitches, staples, or skin tape strips have been removed. °· You have a rash. °· You have a fever. °Get help right away if: °· You have more bleeding (more than a small spot) from the biopsy site. °· You have trouble breathing. °· You have red streaks around the biopsy site. °This information is not intended to replace advice given to you by your health care provider. Make sure you discuss any questions you have with your health care provider. °Document Released: 06/22/2009 Document Revised: 05/02/2016 Document Reviewed: 05/30/2015 °Elsevier Interactive Patient Education © 2018 [employer] °AMBULATORY SURGERY  °DISCHARGE INSTRUCTIONS ° ° °1) The drugs that you were given will stay in your system until tomorrow so for the next 24 hours you should not: ° °A) Drive an automobile °B) Make any legal decisions °C) Drink any alcoholic beverage ° ° °2) You may resume regular meals tomorrow.  Today it is better to start with liquids and gradually work up to solid   foods. ° °You may eat anything you prefer, but it is better to start with liquids, then soup and crackers, and gradually work up to solid foods. ° ° °3) Please notify your doctor immediately if you have any unusual bleeding, trouble breathing, redness and pain at the surgery site, drainage, fever, or pain not relieved by medication. ° ° ° °4) Additional Instructions: ° ° ° ° ° ° ° °Please contact your physician with any problems or Same Day Surgery at [phone], Monday through Friday 6 am to 4 pm, or [place] at  [place] Main number at [phone]. °

## 2018-04-16 NOTE — Transfer of Care (Signed)
Immediate Anesthesia Transfer of Care Note  Patient: Amy Woodard  Procedure(s) Performed: PARTIAL MASTECTOMY WITH NEEDLE LOCALIZATION AND AXILLARY SENTINEL LYMPH NODE BX (Right )  Patient Location: PACU  Anesthesia Type:General  Level of Consciousness: awake and alert   Airway & Oxygen Therapy: Patient Spontanous Breathing and Patient connected to nasal cannula oxygen  Post-op Assessment: Report given to RN and Post -op Vital signs reviewed and stable  Post vital signs: Reviewed and stable  Last Vitals:  Vitals Value Taken Time  BP    Temp    Pulse 95 04/16/2018 12:31 PM  Resp    SpO2 97 % 04/16/2018 12:31 PM  Vitals shown include unvalidated device data.  Last Pain:  Vitals:   04/16/18 0925  TempSrc: Oral  PainSc: 0-No pain         Complications: No apparent anesthesia complications

## 2018-04-18 ENCOUNTER — Other Ambulatory Visit: Payer: Self-pay | Admitting: Anatomic Pathology & Clinical Pathology

## 2018-04-18 LAB — SURGICAL PATHOLOGY

## 2018-04-30 ENCOUNTER — Other Ambulatory Visit: Payer: Self-pay | Admitting: *Deleted

## 2018-05-01 DIAGNOSIS — Z170 Estrogen receptor positive status [ER+]: Secondary | ICD-10-CM

## 2018-05-01 DIAGNOSIS — C50411 Malignant neoplasm of upper-outer quadrant of right female breast: Secondary | ICD-10-CM | POA: Insufficient documentation

## 2018-05-05 ENCOUNTER — Encounter: Payer: Self-pay | Admitting: *Deleted

## 2018-05-05 NOTE — Progress Notes (Signed)
  Oncology Nurse Navigator Documentation  Navigator Location: [facility] (05/05/18 1100)   )Navigator Encounter Type: Telephone (05/05/18 1100) Telephone: Outgoing Call (05/05/18 1100) Abnormal Finding Date: 03/11/18 (05/05/18 1100) Confirmed Diagnosis Date: 03/25/18 (05/05/18 1100) Surgery Date: 04/16/18 (05/05/18 1100)                 Barriers/Navigation Needs: Coordination of Care (05/05/18 1100)   Interventions: Coordination of Care (05/05/18 1100)   Coordination of Care: Appts (05/05/18 1100)                  Time Spent with Patient: 15 (05/05/18 1100)   Called patient today and reviewed need to follow-up with Dr. Tasia Catchings to discuss her surgical pathology.  She has been scheduled to see Dr. Tasia Catchings on Thursday 05/07/18 @ 2:15.  She states that Dr. Lysle Pearl is supposed to call her with those results.  Informed her that dr. Lysle Pearl and Dr. Tasia Catchings talked last night and he is aware that Dr. Tasia Catchings will be scheduling her to come in.

## 2018-05-07 ENCOUNTER — Inpatient Hospital Stay: Payer: PPO | Attending: Oncology | Admitting: Oncology

## 2018-05-07 ENCOUNTER — Encounter: Payer: Self-pay | Admitting: Oncology

## 2018-05-07 VITALS — BP 163/72 | HR 84 | Temp 98.8°F | Wt 163.4 lb

## 2018-05-07 DIAGNOSIS — Z170 Estrogen receptor positive status [ER+]: Secondary | ICD-10-CM | POA: Insufficient documentation

## 2018-05-07 DIAGNOSIS — C50811 Malignant neoplasm of overlapping sites of right female breast: Secondary | ICD-10-CM

## 2018-05-07 DIAGNOSIS — C50919 Malignant neoplasm of unspecified site of unspecified female breast: Secondary | ICD-10-CM

## 2018-05-07 NOTE — Progress Notes (Signed)
Hematology/Oncology Consult note [facility] [phone] [phone]   Patient Care Team: Baxter Hire, MD as PCP - General (Internal Medicine)  REFERRING PROVIDER: Webb Silversmith shaver CHIEF COMPLAINTS/REASON FOR VISIT:  Evaluation of breast cancer  HISTORY OF PRESENTING ILLNESS:  Amy Woodard is a  77 y.o.  female with PMH listed below who was referred to me for evaluation of newly diagnosed breast cancer. Patient had mammogram and ultrasound on 03/19/2018 which showed right breast 12:00 1.6 x 1.3 x 1.5 cm mass, no radiographically enlarged adenopathy.  Patient also report feeling a mass for the past couple of months. Biopsy pathology showed: Invasive mammary carcinoma, no special type, grade 3, DCIS present, lymphovascular invasion not identified, ER PR 90% positive, HER-2 negative  Nipple discharge: Denies Family history: Mother had melanoma, passed away at age of 83, sister had cervical cancer.  Sister with colon cancer, maternal grandmother sister OCP use: denies.  Estrogen and progesterone therapy: denies History of radiation to chest: denies.  Previous breast surgery: Denies  Patient has a history of chronic respiratory failure/ COPD, on home oxygen.  INTERVAL HISTORY Amy Woodard is a 77 y.o. female who has above history reviewed by me today presents for follow up visit for management of breast cancer.  Status post lumpectomy and sentinel lymph node biopsy.  Present to discuss about pathology.  Her case was discussed on 05/04/2018. She complains some local soreness at the site of surgery.  Otherwise doing well. Shortness of breath chronic on home oxygen. Review of Systems  Constitutional: Negative for chills, fever, malaise/fatigue and weight loss.  HENT: Negative for nosebleeds and sore throat.   Eyes: Negative for double vision, photophobia and redness.  Respiratory: Positive for shortness of breath. Negative for cough and  wheezing.   Cardiovascular: Negative for chest pain, palpitations and orthopnea.  Gastrointestinal: Negative for abdominal pain, blood in stool, nausea and vomiting.  Genitourinary: Negative for dysuria.  Musculoskeletal: Negative for back pain, myalgias and neck pain.  Skin: Negative for itching and rash.  Neurological: Negative for dizziness, tingling and tremors.  Endo/Heme/Allergies: Negative for environmental allergies. Does not bruise/bleed easily.  Psychiatric/Behavioral: Negative for depression.    MEDICAL HISTORY:  Past Medical History:  Diagnosis Date  . Allergy   . Breast cancer ([place])   . COPD (chronic obstructive pulmonary disease) ([place])   . Diabetes mellitus without complication ([place])   . Hypothyroidism   . Oxygen dependent     SURGICAL HISTORY: Past Surgical History:  Procedure Laterality Date  . APPENDECTOMY    . BACK SURGERY     lumbar  . BREAST BIOPSY Right 03/19/2018   INVASIVE MAMMARY CARCINOMA, NO SPECIAL TYPE  . BREAST EXCISIONAL BIOPSY Right 04/16/2018   wire loc with lumpectomy  . BREAST LUMPECTOMY Right 04/16/2018  . PARTIAL MASTECTOMY WITH NEEDLE LOCALIZATION AND AXILLARY SENTINEL LYMPH NODE BX Right 04/16/2018   Procedure: PARTIAL MASTECTOMY WITH NEEDLE LOCALIZATION AND AXILLARY SENTINEL LYMPH NODE BX;  Surgeon: Benjamine Sprague, DO;  Location: [facility] ORS;  Service: General;  Laterality: Right;    SOCIAL HISTORY: Social History   Socioeconomic History  . Marital status: Married    Spouse name: Not on file  . Number of children: Not on file  . Years of education: Not on file  . Highest education level: Not on file  Occupational History  . Not on file  Social Needs  . Financial resource strain: Not on file  . Food insecurity:    Worry:  Not on file    Inability: Not on file  . Transportation needs:    Medical: Not on file    Non-medical: Not on file  Tobacco Use  . Smoking status: Former Smoker    Last attempt to quit: 03/30/1993    Years since  quitting: 25.1  . Smokeless tobacco: Never Used  Substance and Sexual Activity  . Alcohol use: No  . Drug use: Never  . Sexual activity: Not on file  Lifestyle  . Physical activity:    Days per week: Not on file    Minutes per session: Not on file  . Stress: Not on file  Relationships  . Social connections:    Talks on phone: Not on file    Gets together: Not on file    Attends religious service: Not on file    Active member of club or organization: Not on file    Attends meetings of clubs or organizations: Not on file    Relationship status: Not on file  . Intimate partner violence:    Fear of current or ex partner: Not on file    Emotionally abused: Not on file    Physically abused: Not on file    Forced sexual activity: Not on file  Other Topics Concern  . Not on file  Social History Narrative  . Not on file    FAMILY HISTORY: Family History  Problem Relation Age of Onset  . Cancer Mother        Melanoma  . Clotting disorder Mother   . Cancer Sister        Cervical Cancer    ALLERGIES:  is allergic to sulfa antibiotics.  MEDICATIONS:  Current Outpatient Medications  Medication Sig Dispense Refill  . amoxicillin-clavulanate (AUGMENTIN) 875-125 MG tablet Take by mouth.    [name] azelastine (ASTELIN) 0.1 % nasal spray Place 1 spray into both nostrils 2 (two) times daily. Use in each nostril as directed    . budesonide-formoterol (SYMBICORT) 160-4.5 MCG/ACT inhaler Inhale 2 puffs into the lungs 2 (two) times daily.     . folic acid (FOLVITE) 1 MG tablet Take 1 mg by mouth daily.    [name] glipiZIDE (GLUCOTROL) 5 MG tablet Take 5 mg by mouth daily before breakfast.    . HYDROcodone-acetaminophen (NORCO) 5-325 MG tablet Take 1-2 tablets by mouth every 6 (six) hours as needed for up to 10 doses for moderate pain. 30 tablet 0  . hydroxychloroquine (PLAQUENIL) 200 MG tablet Take 400 mg by mouth daily.     [name] ibuprofen (ADVIL,MOTRIN) 800 MG tablet Take 1 tablet (800 mg total) by  mouth every 8 (eight) hours as needed for mild pain or moderate pain. 30 tablet 0  . Ipratropium-Albuterol (COMBIVENT) 20-100 MCG/ACT AERS respimat Inhale 1 puff into the lungs every 6 (six) hours as needed for wheezing or shortness of breath.     . levothyroxine (SYNTHROID, LEVOTHROID) 50 MCG tablet Take 50 mcg by mouth daily before breakfast.     No current facility-administered medications for this visit.      PHYSICAL EXAMINATION: ECOG PERFORMANCE STATUS: 1 - Symptomatic but completely ambulatory Vitals:   05/07/18 1427  BP: (!) 163/72  Pulse: 84  Temp: 98.8 F (37.1 C)   Filed Weights   05/07/18 1427  Weight: 163 lb 7 oz (74.1 kg)    Physical Exam  Constitutional: She is oriented to person, place, and time. No distress.  On home oxygen  HENT:  Head: Normocephalic and  atraumatic.  Right Ear: External ear normal.  Left Ear: External ear normal.  Mouth/Throat: Oropharynx is clear and moist.  Eyes: Pupils are equal, round, and reactive to light. Conjunctivae and EOM are normal. No scleral icterus.  Neck: Normal range of motion. Neck supple.  Cardiovascular: Normal rate, regular rhythm and normal heart sounds.  Pulmonary/Chest: Effort normal. No respiratory distress. She has no wheezes. She has no rales. She exhibits no tenderness.  Decreased breath sounds bilaterally  Abdominal: Soft. Bowel sounds are normal. She exhibits no distension and no mass. There is no tenderness.  Musculoskeletal: Normal range of motion. She exhibits no edema or deformity.  Lymphadenopathy:    She has no cervical adenopathy.  Neurological: She is alert and oriented to person, place, and time. No cranial nerve deficit. Coordination normal.  Skin: Skin is warm and dry. No rash noted. No erythema.  Psychiatric: She has a normal mood and affect. Her behavior is normal. Thought content normal.    LABORATORY DATA:  I have reviewed the data as listed Lab Results  Component Value Date   WBC 7.1  03/30/2018   HGB 14.0 03/30/2018   HCT 42.7 03/30/2018   MCV 85.2 03/30/2018   PLT 247 03/30/2018   Recent Labs    03/30/18 1551  NA 142  K 4.2  CL 99  CO2 37*  GLUCOSE 100*  BUN 15  CREATININE 0.56  CALCIUM 9.6  GFRNONAA >60  GFRAA >60  PROT 7.8  ALBUMIN 4.5  AST 21  ALT 19  ALKPHOS 51  BILITOT 0.4   Iron/TIBC/Ferritin/ %Sat No results found for: IRON, TIBC, FERRITIN, IRONPCTSAT      ASSESSMENT & PLAN:  1. Invasive carcinoma of breast [facility])    Pathology results were reviewed and discussed in details with patient.  Right breast lumpectomy showed invasive mammary carcinoma, 1.7 cm, grade 2 DCIS high-grade with extensive involvement of lobules.  sentinel lymph node biopsy negative for involvement of malignancy pT1cN0  Previous biopsy showed ER PR positive HER-2 negative. There was a 3 mm nodule of invasive carcinoma located at the deep edge, 1 mm away from the main tumor mass.  This nodule showed a different histomorphology from the main tumor.  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  Discussed with patient that given the lobular feature of additional breast nodule, positive margin, I recommend an MRI bilateral breast for evaluation of extent of disease.  We will also evaluate if contralateral breast is any mammogram lesions. After MRI she will meet Dr. Lysle Pearl for reexcision.  After she finished surgery part [assuming she needs reexcision of the MR results], she will need to meet Dr. Baruch Gouty for evaluation of adjuvant radiation.  Obtain Oncotype DX    Orders Placed This Encounter  Procedures  . MR BREAST BILATERAL W WO CONTRAST INC CAD    Epic order Draw labs  Invasive carcinoma of breast  Pf- 04/16/18 _0  Wt 163/ ht. 5'9/ no claus/ no metal in eyes/  breast sx-partial mastectomy- right  04/16/18 /  diabetes/no htn/no kidney or liver da/ no allergies to mri cm/ no implants/no needs Ins-  hta Pda/pt Pt aware no solid foods 4 hours / no caffiene         Standing Status:   Future    Standing Expiration Date:   07/08/2019    Order Specific Question:   If indicated for the ordered procedure, I authorize the administration of contrast media  per Radiology protocol    Answer:   Yes    Order Specific Question:   What is the patient's sedation requirement?    Answer:   No Sedation    Order Specific Question:   Does the patient have a pacemaker or implanted devices?    Answer:   No    Order Specific Question:   Radiology Contrast Protocol - do NOT remove file path    Answer:   [email or web address]    Order Specific Question:   ** REASON FOR EXAM (FREE TEXT)    Answer:   breast cancer, margin positive for lobular cancer    Order Specific Question:   Preferred imaging location?    Answer:   [facility] (table limit-550lbs)    All questions were answered. The patient knows to call the clinic with any problems questions or concerns.  Return of visit: To be coordinated by breast cancer navigator RN Sheena. Total face to face encounter time for this patient visit was 15 min. >50% of the time was  spent in counseling and coordination of care.      Earlie Server, MD, PhD Hematology Oncology [facility] Pager- [phone] 05/07/2018

## 2018-05-07 NOTE — Progress Notes (Signed)
Patient here today for follow up post breast surgery

## 2018-05-10 DIAGNOSIS — C50919 Malignant neoplasm of unspecified site of unspecified female breast: Secondary | ICD-10-CM

## 2018-05-10 DIAGNOSIS — N61 Mastitis without abscess: Secondary | ICD-10-CM

## 2018-05-10 HISTORY — DX: Malignant neoplasm of unspecified site of unspecified female breast: C50.919

## 2018-05-10 HISTORY — DX: Mastitis without abscess: N61.0

## 2018-05-13 DIAGNOSIS — J449 Chronic obstructive pulmonary disease, unspecified: Secondary | ICD-10-CM | POA: Diagnosis not present

## 2018-05-14 ENCOUNTER — Ambulatory Visit
Admission: RE | Admit: 2018-05-14 | Discharge: 2018-05-14 | Disposition: A | Discharge status: Home / Self Care | Payer: PPO | Source: Ambulatory Visit | Attending: Oncology | Admitting: Oncology

## 2018-05-14 DIAGNOSIS — C50919 Malignant neoplasm of unspecified site of unspecified female breast: Secondary | ICD-10-CM

## 2018-05-14 DIAGNOSIS — N6321 Unspecified lump in the left breast, upper outer quadrant: Secondary | ICD-10-CM | POA: Diagnosis not present

## 2018-05-14 MED ORDER — GADOBENATE DIMEGLUMINE 529 MG/ML IV SOLN
15.0000 mL | Freq: Once | INTRAVENOUS | Status: AC | PRN
  Administered 2018-05-14: 15 mL via INTRAVENOUS

## 2018-05-15 ENCOUNTER — Telehealth: Payer: Self-pay | Admitting: *Deleted

## 2018-05-15 DIAGNOSIS — E119 Type 2 diabetes mellitus without complications: Secondary | ICD-10-CM | POA: Diagnosis not present

## 2018-05-15 NOTE — Telephone Encounter (Signed)
Called report :  IMPRESSION: Irregular 1.4 cm mass in the lower inner quadrant of the left breast, posterior third, for which malignancy cannot be excluded and MRI-guided biopsy is recommended.  Abnormal nonmass enhancement in the upper-outer right breast, axillary tail region, which is separate from the lumpectomy cavity. Although this enhancement may be benign postsurgical enhancement, malignancy cannot be excluded, and reassessment with breast MRI is recommended. See below.  Lumpectomy cavity/seroma in the upper central right breast. No suspicious findings are seen along the margins of the lumpectomy cavity on MRI.  RECOMMENDATION: It is recommended that the patient be scheduled for bilateral MRI-guided breast biopsies at [place], where the patient had her breast MRI performed. Given that the abnormal enhancement in the upper-outer quadrant of the right breast may be benign postsurgical enhancement, consider scheduling the biopsy in 2-3 weeks, in anticipation that some of this enhancement may significantly decrease or resolve, in which case a right breast biopsy would not be necessary. During the same appointment, a left breast MRI-guided biopsy will be performed of the suspicious mass in the lower inner quadrant, which is expected to persist.  BI-RADS CATEGORY  4: Suspicious.   Electronically Signed   By: Curlene Dolphin M.D.   On: 05/15/2018 14:59

## 2018-05-18 ENCOUNTER — Other Ambulatory Visit: Payer: Self-pay | Admitting: *Deleted

## 2018-05-18 ENCOUNTER — Other Ambulatory Visit: Payer: Self-pay | Admitting: Oncology

## 2018-05-18 ENCOUNTER — Encounter: Payer: Self-pay | Admitting: *Deleted

## 2018-05-18 DIAGNOSIS — N63 Unspecified lump in unspecified breast: Secondary | ICD-10-CM

## 2018-05-18 MED ORDER — ALPRAZOLAM 0.5 MG PO TABS: 0.5000 mg | ORAL_TABLET | ORAL | 0 refills | Status: DC | PRN

## 2018-05-18 NOTE — Progress Notes (Signed)
  Oncology Nurse Navigator Documentation  Navigator Location: [facility] (05/18/18 1300)   )Navigator Encounter Type: Telephone (05/18/18 1300) Telephone: Outgoing Call (05/18/18 1300)                       Barriers/Navigation Needs: Coordination of Care (05/18/18 1300)   Interventions: Coordination of Care (05/18/18 1300)   Coordination of Care: Appts (05/18/18 1300)                  Time Spent with Patient: 30 (05/18/18 1300)   Per request of Dr. Tasia Catchings I have arranged for possible MRI guided bilateral breast biopsies on 06/01/18 as recommended by radiology.  I called and discussed results with the patient and reason for the biopsy.  She has requested something for anxiety for the next biopsy.  Message sent to Dr. Tasia Catchings for prescription for antianxiety med to be prescribed.  Patient is to call if she has any questions or needs.

## 2018-05-19 ENCOUNTER — Other Ambulatory Visit: Payer: Self-pay | Admitting: *Deleted

## 2018-05-19 DIAGNOSIS — E7800 Pure hypercholesterolemia, unspecified: Secondary | ICD-10-CM | POA: Diagnosis not present

## 2018-05-19 DIAGNOSIS — C50411 Malignant neoplasm of upper-outer quadrant of right female breast: Secondary | ICD-10-CM | POA: Diagnosis not present

## 2018-05-19 DIAGNOSIS — E119 Type 2 diabetes mellitus without complications: Secondary | ICD-10-CM | POA: Diagnosis not present

## 2018-05-19 DIAGNOSIS — Z170 Estrogen receptor positive status [ER+]: Secondary | ICD-10-CM | POA: Diagnosis not present

## 2018-05-19 DIAGNOSIS — J9611 Chronic respiratory failure with hypoxia: Secondary | ICD-10-CM | POA: Diagnosis not present

## 2018-05-19 DIAGNOSIS — E034 Atrophy of thyroid (acquired): Secondary | ICD-10-CM | POA: Diagnosis not present

## 2018-05-19 DIAGNOSIS — N63 Unspecified lump in unspecified breast: Secondary | ICD-10-CM

## 2018-05-19 DIAGNOSIS — J449 Chronic obstructive pulmonary disease, unspecified: Secondary | ICD-10-CM | POA: Diagnosis not present

## 2018-05-21 DIAGNOSIS — C50912 Malignant neoplasm of unspecified site of left female breast: Secondary | ICD-10-CM | POA: Diagnosis not present

## 2018-05-21 DIAGNOSIS — Z170 Estrogen receptor positive status [ER+]: Secondary | ICD-10-CM | POA: Diagnosis not present

## 2018-05-25 ENCOUNTER — Encounter: Payer: Self-pay | Admitting: Oncology

## 2018-06-01 ENCOUNTER — Ambulatory Visit
Admission: RE | Admit: 2018-06-01 | Discharge: 2018-06-01 | Disposition: A | Discharge status: Home / Self Care | Payer: PPO | Source: Ambulatory Visit | Attending: Oncology | Admitting: Oncology

## 2018-06-01 DIAGNOSIS — N63 Unspecified lump in unspecified breast: Secondary | ICD-10-CM

## 2018-06-01 DIAGNOSIS — N6324 Unspecified lump in the left breast, lower inner quadrant: Secondary | ICD-10-CM | POA: Diagnosis not present

## 2018-06-01 DIAGNOSIS — C50312 Malignant neoplasm of lower-inner quadrant of left female breast: Secondary | ICD-10-CM | POA: Diagnosis not present

## 2018-06-01 HISTORY — PX: BREAST BIOPSY: SHX20

## 2018-06-01 MED ORDER — GADOBENATE DIMEGLUMINE 529 MG/ML IV SOLN
15.0000 mL | Freq: Once | INTRAVENOUS | Status: AC | PRN
  Administered 2018-06-01: 15 mL via INTRAVENOUS

## 2018-06-02 DIAGNOSIS — Z23 Encounter for immunization: Secondary | ICD-10-CM | POA: Diagnosis not present

## 2018-06-03 ENCOUNTER — Telehealth: Payer: Self-pay | Admitting: Oncology

## 2018-06-03 ENCOUNTER — Other Ambulatory Visit: Payer: Self-pay | Admitting: *Deleted

## 2018-06-03 ENCOUNTER — Telehealth: Payer: Self-pay | Admitting: *Deleted

## 2018-06-03 NOTE — Telephone Encounter (Signed)
Called patient and discussed pathology results with patient. Left side breast mass confirmed invasive carcinoma.  She will need to meet with Dr.Sakai and discuss further treatment plan.  She will need re-exision of right breast surgical bed due to positive deep margin.she also needs lumpectomy and sentinel lymph node biopsy on the left.  We discussed about need of genetic testing. She agrees.will refer to genetic counselor.

## 2018-06-03 NOTE — Telephone Encounter (Signed)
Amy Woodard, at Dr. Ines Bloomer office per Dr. Collie Siad request.  Reviewed positive left breast biopsy after her MRI, and need for return appointment for surgical consult of the left breast cancer and re-excision of the right breast.  [name] is to notify patient of her appointment.  Informed that patient is aware of her diagnosis.

## 2018-06-05 ENCOUNTER — Other Ambulatory Visit: Payer: Self-pay

## 2018-06-05 ENCOUNTER — Encounter
Admission: RE | Admit: 2018-06-05 | Discharge: 2018-06-05 | Disposition: A | Discharge status: Home / Self Care | Payer: PPO | Source: Ambulatory Visit | Attending: Surgery | Admitting: Surgery

## 2018-06-05 ENCOUNTER — Ambulatory Visit: Payer: Self-pay | Admitting: Surgery

## 2018-06-05 HISTORY — DX: Personal history of urinary calculi: Z87.442

## 2018-06-05 HISTORY — DX: Gastro-esophageal reflux disease without esophagitis: K21.9

## 2018-06-05 HISTORY — DX: Dyspnea, unspecified: R06.00

## 2018-06-05 HISTORY — DX: Unspecified osteoarthritis, unspecified site: M19.90

## 2018-06-05 HISTORY — DX: Mastitis without abscess: N61.0

## 2018-06-05 NOTE — Patient Instructions (Signed)
Your procedure is scheduled on: Tuesday, June 09, 2018  Report to [facility] AS PER THEIR INSTRUCTIONS  Remember: Instructions that are not followed completely may result in serious medical risk,  up to and including death, or upon the discretion of your surgeon and anesthesiologist your  surgery may need to be rescheduled.     _X__ 1. Do not eat food after midnight the night before your procedure.                 No gum chewing or hard candies.                    ABSOLUTELY NOTHING SOLID IN YOUR MOUTH AFTER MIDNIGHT                  You may drink clear liquids up to 2 hours before you are scheduled to arrive for your surgery-                   DO not drink clear liquids within 2 hours of the start of your surgery.                  Clear Liquids include:  water, apple juice without pulp, clear carbohydrate                 drink such as Clearfast of Gatorade, Black Coffee or Tea (Do not add                 anything to coffee or tea).  __X__2.  On the morning of surgery brush your teeth with toothpaste and water,                    You may rinse your mouth with mouthwash if you wish.                       Do not swallow any toothpaste of mouthwash.     _X__ 3.  No Alcohol for 24 hours before or after surgery.   _X__ 4.  Do Not Smoke or use e-cigarettes For 24 Hours Prior to Your Surgery.                 Do not use any chewable tobacco products for at least 6 hours prior to                 surgery.  ____  5.  Bring all medications with you on the day of surgery if instructed.   ____  6.  Notify your doctor if there is any change in your medical condition      (cold, fever, infections).     Do not wear jewelry, make-up, hairpins, clips or nail polish. Do not wear lotions, powders, or perfumes. You may NOT wear deodorant. Do not shave 48 hours prior to surgery. Men may shave face and neck. Do not bring valuables to the hospital.    [facility]  is not responsible for any belongings or valuables.  Contacts, dentures or bridgework may not be worn into surgery. Leave your suitcase in the car. After surgery it may be brought to your room. For patients admitted to the hospital, discharge time is determined by your treatment team.   Patients discharged the day of surgery will not be allowed to drive home.   Please read over the following fact sheets that you were given:  PREPARING FOR SURGERY   _X___ Take these medicines the morning of surgery with A SIP OF WATER:    1. ASTELIN NASAL SPRAY  2. SYMBICORT.[name] USE IN THE MORNING AND BRING ALONG WITH YOU ON SURGERY DAY  3.  HYDROCHLOROQUINE  4.  LEVOTHYROXINE  5.  6.  ____ Fleet Enema (as directed)   _X___ Use DIAL ANTIBACTERIAL Soap as directed  _X___ Use inhalers on the day of surgery                 DO NOT TAKE GLIPIZIDE ON THE MORNING OF SURGERY           _X___ Stop ALL ASPIRIN PRODUCTS AS OF TODAY  _X___ Stop Anti-inflammatories AS OF NOW                THIS INCLUDES IBUPROFEN                   YOU MAY RESUME THIS AFTER SURGERY FOR PAIN RELIEF AS NEEDED                 TYLENOL IS OKAY TO TAKE AT ANY TIME PRIOR TO SURGERY   ____ Stop supplements until after surgery.    ____ Bring C-Pap to the hospital.   YOU MAY CONTINUE TO TAKE THE FOLIC ACID BUT DO NOT TAKE ON THE MORNING OF SURGERY  HAVE STOOL SOFTENERS AVAILABLE FOR USE AFTER SURGERY.  REMEMBER THAT YOU WILL NEED TO DO SOME COUGHING AND DEEP BREATHING EXERCISES        AFTER SURGERY SO YOU DO NOT DEVELOP PNEUMONIA OR ANY RESPIRATORY PROBLEMS

## 2018-06-05 NOTE — H&P (Signed)
  Subjective:   CC: Malignant neoplasm of lower-inner quadrant of left breast in female, estrogen receptor positive (CMS-HCC) [C50.312, Z17.0] POSTOP  HPI:  Amy Woodard is a 77 y.o. female who is here for followup from above.  Path from previous results noted a new lobular carcinoma adjacent to the margin deep.  Subsequent MRI study noted a new ductal carcinoma on the left inner quadrant status post MRI biopsy.  Patient returns today today for discussion of reexcision of the deep margin of the right breast and then a partial mastectomy on the left under needle Loc and sentinel lymph biopsy.  Patient otherwise doing well no complaints.    Current Medications: has a current medication list which includes the following prescription(s): azelastine, budesonide-formoterol, folic acid, glipizide, hydroxychloroquine, ipratropium-albuterol, levothyroxine, and tizanidine.  Allergies:      Allergies  Allergen Reactions  . Sulfa (Sulfonamide Antibiotics) Itching and Rash  . Sulfasalazine Itching    ROS: A 15 point review of systems was performed and pertinent positives and negatives noted in HPI   Objective:   BP 129/61   Pulse 89   Temp 37.2 C (98.9 F) (Oral)   Ht 175.3 cm (5\' 9" )   Wt 74 kg (163 lb 2.3 oz)   BMI 24.09 kg/m   Constitutional :  alert, appears stated age, cooperative and no distress  Musculoskeletal: Steady gait and movement  Skin: Cool and moist, incision clean, dry, intact.   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 Possible cellulitis that is improving at incision site.  Will start augmentin to ensure complete resolution.    Discussed the risk of surgery  including recurrence, chronic pain, post-op infxn, poor/delayed wound healing, poor cosmesis, seroma, hematoma formation, and possible re-operation to address said risks. The risks of general anesthetic, if used, includes MI, CVA, sudden death or even reaction to anesthetic medications also discussed.  Typical post-op recovery time and possbility of activity restrictions were also discussed.  Alternatives include continued observation.  Benefits include possible symptom relief, pathologic evaluation, and/or curative excision.   The patient and niece verbalized understanding and all questions were answered to the patient's satisfaction.  We will proceed with left breast needle loc and sentinel lymph node biopsy, and reexcision of the deep margin of right breast mass no need for needle localization        Electronically signed by Benjamine Sprague, DO on 06/05/2018 7:57 AM

## 2018-06-08 ENCOUNTER — Other Ambulatory Visit: Payer: Self-pay | Admitting: Surgery

## 2018-06-08 ENCOUNTER — Encounter: Payer: Self-pay | Admitting: Genetic Counselor

## 2018-06-08 ENCOUNTER — Telehealth: Payer: Self-pay | Admitting: Genetic Counselor

## 2018-06-08 DIAGNOSIS — C50312 Malignant neoplasm of lower-inner quadrant of left female breast: Secondary | ICD-10-CM

## 2018-06-08 NOTE — Telephone Encounter (Signed)
Cancer Genetics            Telegenetics Initial Visit    Patient Name: Amy Woodard Patient DOB: May 12, 1941 Patient Age: 77 y.o. Phone Call Date: 06/08/2018  Referring Provider: Earlie Server, MD  Reason for Visit: Evaluate for hereditary susceptibility to cancer    Assessment and Plan:  . Amy Woodard' history of bilateral breast cancers at age 61 is not highly suggestive of a hereditary predisposition to cancer given her family history. However, we discussed the benefits of ruling out a genetic mutation.   . Amy Woodard did not wish to pursue genetic testing today. Testing can be coordinated at any time in the future if she changes her mind.   . Amy Woodard is encouraged to remain in contact with Cancer Genetics annually so that we can update the family history and inform her of any changes in cancer genetics and testing that may be of benefit for this family.    Dr. Grayland Ormond was available for questions concerning this case. Total time spent by counseling by phone was approximately 20 minutes.   _____________________________________________________________________   History of Present Illness: Amy Woodard, a 77 y.o. female, was referred for genetic counseling to discuss the possibility of a hereditary predisposition to cancer and discuss whether genetic testing is warranted. This was a telegenetics visit via phone.  Amy Woodard was recently diagnosed with bilateral breast cancers at the age of 66. She indicated that she needs a re-excision fn the right breast cancer and will be pursuing lumpectomy of the left breast cancer.    Past Medical History:  Diagnosis Date  . Allergy   . Arthritis    rheumatoid arthritis  . Breast cancer ([place]) 05/2018   Right breast found 1st, then left breast with ductal ca  . Cellulitis of breast 05/2018   right breast cellulitis after first biopsy,  antibiotics prescribed  . COPD (chronic obstructive pulmonary disease)  ([place])   . Diabetes mellitus without complication ([place])   . Dyspnea   . GERD (gastroesophageal reflux disease)    occasionally  . History of kidney stones    has one but not a problem  . Hypothyroidism   . Oxygen dependent    2liter nasal prong continuously    Past Surgical History:  Procedure Laterality Date  . APPENDECTOMY    . BACK SURGERY     lumbar. herniated disc . no metal  . BREAST BIOPSY Right 03/19/2018   INVASIVE MAMMARY CARCINOMA, NO SPECIAL TYPE  . BREAST EXCISIONAL BIOPSY Right 04/16/2018   wire loc with lumpectomy  . BREAST LUMPECTOMY Right 04/16/2018  . MASTECTOMY    . PARTIAL MASTECTOMY WITH NEEDLE LOCALIZATION AND AXILLARY SENTINEL LYMPH NODE BX Right 04/16/2018   Procedure: PARTIAL MASTECTOMY WITH NEEDLE LOCALIZATION AND AXILLARY SENTINEL LYMPH NODE BX;  Surgeon: Benjamine Sprague, DO;  Location: [facility] ORS;  Service: General;  Laterality: Right;    Family History: Significant diagnoses include the following:  Family History  Problem Relation Age of Onset  . Clotting disorder Mother   . Melanoma Mother        deceased 77  . Lung cancer Sister        deceased 63s; smoker  . Alcohol abuse Father        deceased 45  . Cervical cancer Other        neice    Additionally, Amy Woodard has 2 sons (ages  48 and 52).  She had 3 full sisters (one still living) and one maternal half-sister. Her mother (noted above) had a brother. Her father (noted above) had a brother and 2 sisters. She did not know her grandparents on her father's side well, but stated that there are no cancers in any of her grandparents that she is aware of.  Amy Woodard ancestry is Caucasian - NOS. There is no known Jewish ancestry and no consanguinity.  Discussion: We reviewed the characteristics, features and inheritance patterns of hereditary cancer syndromes. We discussed her risk of harboring a mutation in the context of her personal and family history. We discussed the process of genetic  testing, insurance coverage and implications of results: positive, negative and variant of uncertain significance (VUS).   Amy Woodard questions were answered to her satisfaction today and she is welcome to call with any additional questions or concerns. Thank you for the referral and allowing [place] to share in the care of your patient.    Steele Berg, MS, [place] Certified Genetic Counselor phone: [phone]

## 2018-06-09 ENCOUNTER — Other Ambulatory Visit: Payer: Self-pay | Admitting: Oncology

## 2018-06-09 ENCOUNTER — Encounter: Payer: Self-pay | Admitting: Certified Registered Nurse Anesthetist

## 2018-06-09 ENCOUNTER — Ambulatory Visit: Admission: RE | Admit: 2018-06-09 | Payer: PPO | Source: Ambulatory Visit

## 2018-06-09 ENCOUNTER — Ambulatory Visit
Admission: RE | Admit: 2018-06-09 | Discharge: 2018-06-09 | Disposition: A | Discharge status: Home / Self Care | Payer: PPO | Source: Ambulatory Visit | Attending: Surgery | Admitting: Surgery

## 2018-06-09 ENCOUNTER — Encounter
Admission: RE | Disposition: A | Discharge status: Home / Self Care | Payer: Self-pay | Source: Ambulatory Visit | Attending: Surgery

## 2018-06-09 DIAGNOSIS — C50312 Malignant neoplasm of lower-inner quadrant of left female breast: Secondary | ICD-10-CM

## 2018-06-09 HISTORY — PX: BREAST LUMPECTOMY: SHX2

## 2018-06-09 SURGERY — PARTIAL MASTECTOMY WITH NEEDLE LOCALIZATION
Anesthesia: Choice | Laterality: Right

## 2018-06-09 MED ORDER — LIDOCAINE-EPINEPHRINE 1 %-1:100000 IJ SOLN
INTRAMUSCULAR | Status: AC
  Filled 2018-06-09: qty 1

## 2018-06-09 MED ORDER — FENTANYL CITRATE (PF) 100 MCG/2ML IJ SOLN
INTRAMUSCULAR | Status: AC
  Filled 2018-06-09: qty 2

## 2018-06-09 MED ORDER — BUPIVACAINE HCL (PF) 0.5 % IJ SOLN
INTRAMUSCULAR | Status: AC
  Filled 2018-06-09: qty 30

## 2018-06-09 MED ORDER — CEFAZOLIN SODIUM-DEXTROSE 2-4 GM/100ML-% IV SOLN: 2.0000 g | INTRAVENOUS | Status: DC

## 2018-06-12 ENCOUNTER — Other Ambulatory Visit: Payer: Self-pay | Admitting: Surgery

## 2018-06-12 DIAGNOSIS — C50912 Malignant neoplasm of unspecified site of left female breast: Secondary | ICD-10-CM

## 2018-06-15 DIAGNOSIS — J449 Chronic obstructive pulmonary disease, unspecified: Secondary | ICD-10-CM | POA: Diagnosis not present

## 2018-06-16 ENCOUNTER — Encounter: Payer: Self-pay | Admitting: *Deleted

## 2018-06-19 ENCOUNTER — Ambulatory Visit
Admission: RE | Admit: 2018-06-19 | Discharge: 2018-06-19 | Disposition: A | Discharge status: Home / Self Care | Payer: PPO | Source: Ambulatory Visit | Attending: Surgery | Admitting: Surgery

## 2018-06-19 DIAGNOSIS — N6324 Unspecified lump in the left breast, lower inner quadrant: Secondary | ICD-10-CM | POA: Diagnosis not present

## 2018-06-19 DIAGNOSIS — C50312 Malignant neoplasm of lower-inner quadrant of left female breast: Secondary | ICD-10-CM | POA: Diagnosis not present

## 2018-06-19 DIAGNOSIS — C50912 Malignant neoplasm of unspecified site of left female breast: Secondary | ICD-10-CM

## 2018-06-19 MED ORDER — GADOBENATE DIMEGLUMINE 529 MG/ML IV SOLN
15.0000 mL | Freq: Once | INTRAVENOUS | Status: AC | PRN
  Administered 2018-06-19: 15 mL via INTRAVENOUS

## 2018-06-25 ENCOUNTER — Ambulatory Visit: Payer: Self-pay | Admitting: Surgery

## 2018-06-25 NOTE — H&P (View-Only) (Signed)
CC: Malignant neoplasm of lower-inner quadrant of left breast in female, estrogen receptor positive (CMS-HCC) [C50.312, Z17.0] POSTOP  HPI:  Amy Woodard is a 77 y.o. female who is here for followup from above.  Path from previous results noted a new lobular carcinoma adjacent to the margin deep.  Subsequent MRI study noted a new ductal carcinoma on the left inner quadrant status post MRI biopsy.  Patient returns today today for discussion of reexcision of the deep margin of the right breast and then a partial mastectomy on the left under needle Loc and sentinel lymph biopsy.  Patient otherwise doing well no complaints.    Current Medications: has a current medication list which includes the following prescription(s): azelastine, budesonide-formoterol, folic acid, glipizide, hydroxychloroquine, ipratropium-albuterol, levothyroxine, and tizanidine.  Allergies:      Allergies  Allergen Reactions  . Sulfa (Sulfonamide Antibiotics) Itching and Rash  . Sulfasalazine Itching    ROS: A 15 point review of systems was performed and pertinent positives and negatives noted in HPI   Objective:   BP 129/61   Pulse 89   Temp 37.2 C (98.9 F) (Oral)   Ht 175.3 cm (5\' 9" )   Wt 74 kg (163 lb 2.3 oz)   BMI 24.09 kg/m   Constitutional :  alert, appears stated age, cooperative and no distress  Musculoskeletal: Steady gait and movement  Skin: Cool and moist, incision clean, dry, intact.   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 Possible cellulitis that is improving at incision site.  Will start augmentin to ensure complete resolution.    Discussed the risk of surgery including recurrence,  chronic pain, post-op infxn, poor/delayed wound healing, poor cosmesis, seroma, hematoma formation, and possible re-operation to address said risks. The risks of general anesthetic, if used, includes MI, CVA, sudden death or even reaction to anesthetic medications also discussed.  Typical post-op recovery time and possbility of activity restrictions were also discussed.  Alternatives include continued observation.  Benefits include possible symptom relief, pathologic evaluation, and/or curative excision.   The patient and niece verbalized understanding and all questions were answered to the patient's satisfaction.  We will proceed with left breast needle loc and sentinel lymph node biopsy, and reexcision of the deep margin of right breast mass no need for needle localization

## 2018-06-25 NOTE — H&P (Signed)
CC: Malignant neoplasm of lower-inner quadrant of left breast in female, estrogen receptor positive (CMS-HCC) [C50.312, Z17.0] POSTOP  HPI:  Amy Woodard is a 77 y.o. female who is here for followup from above.  Path from previous results noted a new lobular carcinoma adjacent to the margin deep.  Subsequent MRI study noted a new ductal carcinoma on the left inner quadrant status post MRI biopsy.  Patient returns today today for discussion of reexcision of the deep margin of the right breast and then a partial mastectomy on the left under needle Loc and sentinel lymph biopsy.  Patient otherwise doing well no complaints.    Current Medications: has a current medication list which includes the following prescription(s): azelastine, budesonide-formoterol, folic acid, glipizide, hydroxychloroquine, ipratropium-albuterol, levothyroxine, and tizanidine.  Allergies:      Allergies  Allergen Reactions  . Sulfa (Sulfonamide Antibiotics) Itching and Rash  . Sulfasalazine Itching    ROS: A 15 point review of systems was performed and pertinent positives and negatives noted in HPI   Objective:   BP 129/61   Pulse 89   Temp 37.2 C (98.9 F) (Oral)   Ht 175.3 cm (5\' 9" )   Wt 74 kg (163 lb 2.3 oz)   BMI 24.09 kg/m   Constitutional :  alert, appears stated age, cooperative and no distress  Musculoskeletal: Steady gait and movement  Skin: Cool and moist, incision clean, dry, intact.   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 Possible cellulitis that is improving at incision site.  Will start augmentin to ensure complete resolution.    Discussed the risk of surgery including recurrence,  chronic pain, post-op infxn, poor/delayed wound healing, poor cosmesis, seroma, hematoma formation, and possible re-operation to address said risks. The risks of general anesthetic, if used, includes MI, CVA, sudden death or even reaction to anesthetic medications also discussed.  Typical post-op recovery time and possbility of activity restrictions were also discussed.  Alternatives include continued observation.  Benefits include possible symptom relief, pathologic evaluation, and/or curative excision.   The patient and niece verbalized understanding and all questions were answered to the patient's satisfaction.  We will proceed with left breast needle loc and sentinel lymph node biopsy, and reexcision of the deep margin of right breast mass no need for needle localization

## 2018-07-02 ENCOUNTER — Encounter: Payer: Self-pay | Admitting: Anesthesiology

## 2018-07-02 MED ORDER — CEFAZOLIN SODIUM-DEXTROSE 2-4 GM/100ML-% IV SOLN
2.0000 g | INTRAVENOUS | Status: AC
  Administered 2018-07-03: 2 g via INTRAVENOUS

## 2018-07-03 ENCOUNTER — Encounter
Admission: RE | Disposition: A | Discharge status: Home / Self Care | Payer: Self-pay | Source: Ambulatory Visit | Attending: Surgery

## 2018-07-03 ENCOUNTER — Ambulatory Visit
Admission: RE | Admit: 2018-07-03 | Discharge: 2018-07-03 | Disposition: A | Discharge status: Home / Self Care | Payer: PPO | Source: Ambulatory Visit | Attending: Surgery | Admitting: Surgery

## 2018-07-03 ENCOUNTER — Observation Stay
Admission: RE | Admit: 2018-07-03 | Discharge: 2018-07-03 | Disposition: A | Discharge status: Home / Self Care | Payer: PPO | Source: Ambulatory Visit | Attending: Surgery | Admitting: Surgery

## 2018-07-03 ENCOUNTER — Observation Stay
Admission: RE | Admit: 2018-07-03 | Discharge: 2018-07-06 | Disposition: A | Discharge status: Home / Self Care | Payer: PPO | Source: Ambulatory Visit | Attending: Surgery | Admitting: Surgery

## 2018-07-03 ENCOUNTER — Encounter: Payer: Self-pay | Admitting: *Deleted

## 2018-07-03 ENCOUNTER — Other Ambulatory Visit: Payer: Self-pay

## 2018-07-03 ENCOUNTER — Ambulatory Visit: Payer: PPO | Admitting: Anesthesiology

## 2018-07-03 ENCOUNTER — Ambulatory Visit: Payer: PPO

## 2018-07-03 DIAGNOSIS — E039 Hypothyroidism, unspecified: Secondary | ICD-10-CM | POA: Diagnosis not present

## 2018-07-03 DIAGNOSIS — C50411 Malignant neoplasm of upper-outer quadrant of right female breast: Secondary | ICD-10-CM | POA: Diagnosis not present

## 2018-07-03 DIAGNOSIS — C50312 Malignant neoplasm of lower-inner quadrant of left female breast: Secondary | ICD-10-CM | POA: Diagnosis not present

## 2018-07-03 DIAGNOSIS — M069 Rheumatoid arthritis, unspecified: Secondary | ICD-10-CM | POA: Insufficient documentation

## 2018-07-03 DIAGNOSIS — K219 Gastro-esophageal reflux disease without esophagitis: Secondary | ICD-10-CM | POA: Diagnosis not present

## 2018-07-03 DIAGNOSIS — C50912 Malignant neoplasm of unspecified site of left female breast: Secondary | ICD-10-CM | POA: Diagnosis not present

## 2018-07-03 DIAGNOSIS — Z9981 Dependence on supplemental oxygen: Secondary | ICD-10-CM | POA: Diagnosis not present

## 2018-07-03 DIAGNOSIS — E119 Type 2 diabetes mellitus without complications: Secondary | ICD-10-CM | POA: Diagnosis not present

## 2018-07-03 DIAGNOSIS — Z882 Allergy status to sulfonamides status: Secondary | ICD-10-CM | POA: Insufficient documentation

## 2018-07-03 DIAGNOSIS — Z7984 Long term (current) use of oral hypoglycemic drugs: Secondary | ICD-10-CM | POA: Diagnosis not present

## 2018-07-03 DIAGNOSIS — Z79899 Other long term (current) drug therapy: Secondary | ICD-10-CM | POA: Insufficient documentation

## 2018-07-03 DIAGNOSIS — Z87891 Personal history of nicotine dependence: Secondary | ICD-10-CM | POA: Insufficient documentation

## 2018-07-03 DIAGNOSIS — Z170 Estrogen receptor positive status [ER+]: Secondary | ICD-10-CM | POA: Diagnosis not present

## 2018-07-03 DIAGNOSIS — J449 Chronic obstructive pulmonary disease, unspecified: Secondary | ICD-10-CM | POA: Insufficient documentation

## 2018-07-03 DIAGNOSIS — C50919 Malignant neoplasm of unspecified site of unspecified female breast: Secondary | ICD-10-CM | POA: Diagnosis present

## 2018-07-03 DIAGNOSIS — C50911 Malignant neoplasm of unspecified site of right female breast: Secondary | ICD-10-CM | POA: Insufficient documentation

## 2018-07-03 HISTORY — PX: BREAST LUMPECTOMY: SHX2

## 2018-07-03 HISTORY — PX: PARTIAL MASTECTOMY WITH NEEDLE LOCALIZATION AND AXILLARY SENTINEL LYMPH NODE BX: SHX6009

## 2018-07-03 LAB — CREATININE, SERUM
CREATININE: 0.64 mg/dL (ref 0.44–1.00)
GFR calc non Af Amer: >60 mL/min (ref >60)

## 2018-07-03 LAB — CBC
HCT: 39.4 % (ref 36.0–46.0)
Hemoglobin: 12.2 g/dL (ref 12.0–15.0)
MCH: 27.5 pg (ref 26.0–34.0)
MCHC: 31 g/dL (ref 30.0–36.0)
MCV: 88.9 fL (ref 80.0–100.0)
NRBC: 0 % (ref 0.0–0.2)
PLATELETS: 246 10*3/uL (ref 150–400)
RBC: 4.43 MIL/uL (ref 3.87–5.11)
RDW: 13.1 % (ref 11.5–15.5)
WBC: 11.8 10*3/uL — ABNORMAL HIGH (ref 4.0–10.5)

## 2018-07-03 LAB — GLUCOSE, CAPILLARY
GLUCOSE-CAPILLARY: 104 mg/dL — AB (ref 70–99)
Glucose-Capillary: 165 mg/dL — ABNORMAL HIGH (ref 70–99)

## 2018-07-03 SURGERY — PARTIAL MASTECTOMY WITH NEEDLE LOCALIZATION AND AXILLARY SENTINEL LYMPH NODE BX
Anesthesia: General | Site: Breast | Laterality: Right

## 2018-07-03 MED ORDER — ACETAMINOPHEN 500 MG PO TABS
ORAL_TABLET | ORAL | Status: AC
  Filled 2018-07-03: qty 1

## 2018-07-03 MED ORDER — CELECOXIB 200 MG PO CAPS
200.0000 mg | ORAL_CAPSULE | Freq: Every day | ORAL | Status: DC | PRN
  Filled 2018-07-03: qty 1

## 2018-07-03 MED ORDER — AZELASTINE HCL 0.1 % NA SOLN
1.0000 | Freq: Two times a day (BID) | NASAL | Status: DC
  Administered 2018-07-03 – 2018-07-06 (×6): 1 via NASAL
  Filled 2018-07-03: qty 30

## 2018-07-03 MED ORDER — ONDANSETRON HCL 4 MG/2ML IJ SOLN
INTRAMUSCULAR | Status: DC | PRN
  Administered 2018-07-03: 4 mg via INTRAVENOUS

## 2018-07-03 MED ORDER — CELECOXIB 200 MG PO CAPS: 200.0000 mg | ORAL_CAPSULE | Freq: Every day | ORAL | 0 refills | Status: AC | PRN

## 2018-07-03 MED ORDER — SODIUM CHLORIDE 0.9 % IV SOLN
INTRAVENOUS | Status: DC | PRN
  Administered 2018-07-03: 35 ug/min via INTRAVENOUS

## 2018-07-03 MED ORDER — MORPHINE SULFATE (PF) 2 MG/ML IV SOLN
2.0000 mg | INTRAVENOUS | Status: DC | PRN
  Administered 2018-07-04 – 2018-07-06 (×7): 2 mg via INTRAVENOUS
  Filled 2018-07-03 (×7): qty 1

## 2018-07-03 MED ORDER — ONDANSETRON 4 MG PO TBDP: 4.0000 mg | ORAL_TABLET | Freq: Four times a day (QID) | ORAL | Status: DC | PRN

## 2018-07-03 MED ORDER — ONDANSETRON HCL 4 MG/2ML IJ SOLN
INTRAMUSCULAR | Status: AC
  Filled 2018-07-03: qty 2

## 2018-07-03 MED ORDER — LIDOCAINE HCL (PF) 2 % IJ SOLN
INTRAMUSCULAR | Status: AC
  Filled 2018-07-03: qty 10

## 2018-07-03 MED ORDER — PROPOFOL 10 MG/ML IV BOLUS
INTRAVENOUS | Status: DC | PRN
  Administered 2018-07-03: 180 mg via INTRAVENOUS

## 2018-07-03 MED ORDER — HYDROXYCHLOROQUINE SULFATE 200 MG PO TABS
400.0000 mg | ORAL_TABLET | Freq: Every day | ORAL | Status: DC
  Administered 2018-07-04 – 2018-07-06 (×3): 400 mg via ORAL
  Filled 2018-07-03 (×4): qty 2

## 2018-07-03 MED ORDER — HEPARIN SODIUM (PORCINE) 5000 UNIT/ML IJ SOLN
INTRAMUSCULAR | Status: AC
  Filled 2018-07-03: qty 1

## 2018-07-03 MED ORDER — IPRATROPIUM-ALBUTEROL 0.5-2.5 (3) MG/3ML IN SOLN: 3.0000 mL | Freq: Four times a day (QID) | RESPIRATORY_TRACT | Status: DC | PRN

## 2018-07-03 MED ORDER — POLYETHYLENE GLYCOL 3350 17 G PO PACK: 17.0000 g | PACK | Freq: Every day | ORAL | Status: DC | PRN

## 2018-07-03 MED ORDER — FAMOTIDINE 20 MG PO TABS
20.0000 mg | ORAL_TABLET | Freq: Once | ORAL | Status: AC
  Administered 2018-07-03: 20 mg via ORAL

## 2018-07-03 MED ORDER — ENOXAPARIN SODIUM 40 MG/0.4ML ~~LOC~~ SOLN
40.0000 mg | SUBCUTANEOUS | Status: DC
  Administered 2018-07-04 – 2018-07-06 (×3): 40 mg via SUBCUTANEOUS
  Filled 2018-07-03 (×3): qty 0.4

## 2018-07-03 MED ORDER — ACETAMINOPHEN 325 MG PO TABS: 650.0000 mg | ORAL_TABLET | Freq: Four times a day (QID) | ORAL | Status: DC | PRN

## 2018-07-03 MED ORDER — LEVOTHYROXINE SODIUM 50 MCG PO TABS
50.0000 ug | ORAL_TABLET | Freq: Every day | ORAL | Status: DC
  Administered 2018-07-04 – 2018-07-06 (×3): 50 ug via ORAL
  Filled 2018-07-03 (×3): qty 1

## 2018-07-03 MED ORDER — CHLORHEXIDINE GLUCONATE CLOTH 2 % EX PADS: 6.0000 | MEDICATED_PAD | Freq: Once | CUTANEOUS | Status: DC

## 2018-07-03 MED ORDER — FENTANYL CITRATE (PF) 100 MCG/2ML IJ SOLN
INTRAMUSCULAR | Status: AC
  Filled 2018-07-03: qty 2

## 2018-07-03 MED ORDER — ACETAMINOPHEN 500 MG PO TABS
1000.0000 mg | ORAL_TABLET | ORAL | Status: AC
  Administered 2018-07-03: 1000 mg via ORAL

## 2018-07-03 MED ORDER — CEFAZOLIN SODIUM-DEXTROSE 2-4 GM/100ML-% IV SOLN
INTRAVENOUS | Status: AC
  Filled 2018-07-03: qty 100

## 2018-07-03 MED ORDER — FAMOTIDINE 20 MG PO TABS
ORAL_TABLET | ORAL | Status: AC
  Filled 2018-07-03: qty 1

## 2018-07-03 MED ORDER — FENTANYL CITRATE (PF) 100 MCG/2ML IJ SOLN
25.0000 ug | INTRAMUSCULAR | Status: DC | PRN
  Administered 2018-07-03 (×3): 25 ug via INTRAVENOUS

## 2018-07-03 MED ORDER — MOMETASONE FURO-FORMOTEROL FUM 200-5 MCG/ACT IN AERO
2.0000 | INHALATION_SPRAY | Freq: Two times a day (BID) | RESPIRATORY_TRACT | Status: DC
  Administered 2018-07-03 – 2018-07-06 (×6): 2 via RESPIRATORY_TRACT
  Filled 2018-07-03: qty 8.8

## 2018-07-03 MED ORDER — LIDOCAINE HCL (CARDIAC) PF 100 MG/5ML IV SOSY
PREFILLED_SYRINGE | INTRAVENOUS | Status: DC | PRN
  Administered 2018-07-03: 100 mg via INTRAVENOUS

## 2018-07-03 MED ORDER — DEXAMETHASONE SODIUM PHOSPHATE 10 MG/ML IJ SOLN
INTRAMUSCULAR | Status: DC | PRN
  Administered 2018-07-03: 10 mg via INTRAVENOUS

## 2018-07-03 MED ORDER — FENTANYL CITRATE (PF) 100 MCG/2ML IJ SOLN
INTRAMUSCULAR | Status: DC | PRN
  Administered 2018-07-03 (×2): 50 ug via INTRAVENOUS
  Administered 2018-07-03: 25 ug via INTRAVENOUS
  Administered 2018-07-03: 50 ug via INTRAVENOUS

## 2018-07-03 MED ORDER — PHENYLEPHRINE HCL 10 MG/ML IJ SOLN
INTRAMUSCULAR | Status: AC
  Filled 2018-07-03: qty 1

## 2018-07-03 MED ORDER — GABAPENTIN 300 MG PO CAPS
ORAL_CAPSULE | ORAL | Status: AC
  Filled 2018-07-03: qty 1

## 2018-07-03 MED ORDER — DEXAMETHASONE SODIUM PHOSPHATE 10 MG/ML IJ SOLN
INTRAMUSCULAR | Status: AC
  Filled 2018-07-03: qty 1

## 2018-07-03 MED ORDER — EPHEDRINE SULFATE 50 MG/ML IJ SOLN
INTRAMUSCULAR | Status: AC
  Filled 2018-07-03: qty 1

## 2018-07-03 MED ORDER — DOCUSATE SODIUM 100 MG PO CAPS: 100.0000 mg | ORAL_CAPSULE | Freq: Two times a day (BID) | ORAL | 0 refills | Status: AC | PRN

## 2018-07-03 MED ORDER — OXYCODONE-ACETAMINOPHEN 5-325 MG PO TABS
1.0000 | ORAL_TABLET | ORAL | Status: DC | PRN
  Administered 2018-07-04: 2 via ORAL
  Filled 2018-07-03: qty 2

## 2018-07-03 MED ORDER — ONDANSETRON HCL 4 MG/2ML IJ SOLN
4.0000 mg | Freq: Four times a day (QID) | INTRAMUSCULAR | Status: DC | PRN
  Administered 2018-07-04: 4 mg via INTRAVENOUS
  Filled 2018-07-03: qty 2

## 2018-07-03 MED ORDER — FENTANYL CITRATE (PF) 100 MCG/2ML IJ SOLN
INTRAMUSCULAR | Status: AC
  Administered 2018-07-03: 25 ug via INTRAVENOUS
  Filled 2018-07-03: qty 2

## 2018-07-03 MED ORDER — BUPIVACAINE HCL (PF) 0.5 % IJ SOLN
INTRAMUSCULAR | Status: AC
  Filled 2018-07-03: qty 30

## 2018-07-03 MED ORDER — LIDOCAINE HCL 1 % IJ SOLN
INTRAMUSCULAR | Status: DC | PRN
  Administered 2018-07-03: 8 mL

## 2018-07-03 MED ORDER — ONDANSETRON HCL 4 MG/2ML IJ SOLN: 4.0000 mg | Freq: Once | INTRAMUSCULAR | Status: DC | PRN

## 2018-07-03 MED ORDER — LIDOCAINE HCL (PF) 1 % IJ SOLN
INTRAMUSCULAR | Status: AC
  Filled 2018-07-03: qty 30

## 2018-07-03 MED ORDER — SODIUM CHLORIDE 0.9 % IV SOLN
INTRAVENOUS | Status: DC
  Administered 2018-07-03: 11:00:00 via INTRAVENOUS

## 2018-07-03 MED ORDER — TECHNETIUM TC 99M SULFUR COLLOID FILTERED
1.0000 | Freq: Once | INTRAVENOUS | Status: AC | PRN
  Administered 2018-07-03: 0.883 via INTRADERMAL

## 2018-07-03 MED ORDER — TECHNETIUM TC 99M TETROFOSMIN IV KIT: 10.8400 | PACK | Freq: Once | INTRAVENOUS | Status: DC | PRN

## 2018-07-03 MED ORDER — GLYCOPYRROLATE 0.2 MG/ML IJ SOLN
INTRAMUSCULAR | Status: AC
  Filled 2018-07-03: qty 1

## 2018-07-03 MED ORDER — ACETAMINOPHEN 500 MG PO TABS
ORAL_TABLET | ORAL | Status: AC
  Filled 2018-07-03: qty 2

## 2018-07-03 MED ORDER — GLIPIZIDE 5 MG PO TABS
5.0000 mg | ORAL_TABLET | Freq: Every day | ORAL | Status: DC
  Administered 2018-07-04 – 2018-07-06 (×3): 5 mg via ORAL
  Filled 2018-07-03 (×3): qty 1

## 2018-07-03 MED ORDER — OXYCODONE-ACETAMINOPHEN 5-325 MG PO TABS: 1.0000 | ORAL_TABLET | ORAL | 0 refills | Status: DC | PRN

## 2018-07-03 MED ORDER — PROPOFOL 10 MG/ML IV BOLUS
INTRAVENOUS | Status: AC
  Filled 2018-07-03: qty 20

## 2018-07-03 MED ORDER — FOLIC ACID 1 MG PO TABS
1.0000 mg | ORAL_TABLET | Freq: Every day | ORAL | Status: DC
  Administered 2018-07-04 – 2018-07-06 (×3): 1 mg via ORAL
  Filled 2018-07-03 (×3): qty 1

## 2018-07-03 MED ORDER — ACETAMINOPHEN ER 650 MG PO TBCR: 650.0000 mg | EXTENDED_RELEASE_TABLET | Freq: Three times a day (TID) | ORAL | 0 refills | Status: DC | PRN

## 2018-07-03 MED ORDER — PHENYLEPHRINE HCL 10 MG/ML IJ SOLN
INTRAMUSCULAR | Status: DC | PRN
  Administered 2018-07-03: 100 ug via INTRAVENOUS
  Administered 2018-07-03: 200 ug via INTRAVENOUS
  Administered 2018-07-03 (×3): 100 ug via INTRAVENOUS
  Administered 2018-07-03 (×2): 50 ug via INTRAVENOUS

## 2018-07-03 MED ORDER — GABAPENTIN 300 MG PO CAPS
300.0000 mg | ORAL_CAPSULE | ORAL | Status: AC
  Administered 2018-07-03: 300 mg via ORAL

## 2018-07-03 SURGICAL SUPPLY — 48 items
APPLIER CLIP 11 MED OPEN (CLIP)
BLADE SURG 15 STRL LF DISP TIS (BLADE) ×2 IMPLANT
BLADE SURG 15 STRL SS (BLADE) ×2
CANISTER SUCT 1200ML W/VALVE (MISCELLANEOUS) ×4 IMPLANT
CHLORAPREP W/TINT 26ML (MISCELLANEOUS) ×4 IMPLANT
CLIP APPLIE 11 MED OPEN (CLIP) IMPLANT
CNTNR SPEC 2.5X3XGRAD LEK (MISCELLANEOUS) ×2
CONT SPEC 4OZ STER OR WHT (MISCELLANEOUS) ×2
CONTAINER SPEC 2.5X3XGRAD LEK (MISCELLANEOUS) ×2 IMPLANT
COVER WAND RF STERILE (DRAPES) ×4 IMPLANT
DERMABOND ADVANCED (GAUZE/BANDAGES/DRESSINGS) ×2
DERMABOND ADVANCED .7 DNX12 (GAUZE/BANDAGES/DRESSINGS) ×2 IMPLANT
DEVICE DUBIN SPECIMEN MAMMOGRA (MISCELLANEOUS) ×8 IMPLANT
DRAPE CHEST BREAST 77X106 FENE (MISCELLANEOUS) IMPLANT
DRAPE LAPAROTOMY 77X122 PED (DRAPES) ×4 IMPLANT
DRAPE LAPAROTOMY TRNSV 106X77 (MISCELLANEOUS) ×4 IMPLANT
DRAPE SHEET LG 3/4 BI-LAMINATE (DRAPES) ×4 IMPLANT
ELECT CAUTERY BLADE TIP 2.5 (TIP) ×4
ELECT CAUTERY NEEDLE 2.0 MIC (NEEDLE) ×4 IMPLANT
ELECT REM PT RETURN 9FT ADLT (ELECTROSURGICAL) ×4
ELECTRODE CAUTERY BLDE TIP 2.5 (TIP) ×2 IMPLANT
ELECTRODE REM PT RTRN 9FT ADLT (ELECTROSURGICAL) ×2 IMPLANT
GAUZE SPONGE 4X4 12PLY STRL (GAUZE/BANDAGES/DRESSINGS) ×4 IMPLANT
GLOVE BIOGEL PI IND STRL 7.0 (GLOVE) ×2 IMPLANT
GLOVE BIOGEL PI INDICATOR 7.0 (GLOVE) ×2
GLOVE SURG SYN 7.0 (GLOVE) ×8 IMPLANT
GOWN STRL REUS W/ TWL LRG LVL3 (GOWN DISPOSABLE) ×6 IMPLANT
GOWN STRL REUS W/TWL LRG LVL3 (GOWN DISPOSABLE) ×6
JACKSON PRATT 10 (INSTRUMENTS) IMPLANT
KIT TURNOVER KIT A (KITS) ×4 IMPLANT
LABEL OR SOLS (LABEL) ×4 IMPLANT
LIGHT WAVEGUIDE WIDE FLAT (MISCELLANEOUS) ×4 IMPLANT
NEEDLE HYPO 25X1 1.5 SAFETY (NEEDLE) ×8 IMPLANT
PACK BASIN MINOR ARMC (MISCELLANEOUS) ×4 IMPLANT
SLEVE PROBE SENORX GAMMA FIND (MISCELLANEOUS) ×8 IMPLANT
SUT MNCRL 4-0 (SUTURE) ×4
SUT MNCRL 4-0 27XMFL (SUTURE) ×4
SUT SILK 2 0 (SUTURE) ×2
SUT SILK 2 0 SH (SUTURE) ×4 IMPLANT
SUT SILK 2-0 30XBRD TIE 12 (SUTURE) ×2 IMPLANT
SUT SILK 3 0 12 30 (SUTURE) ×4 IMPLANT
SUT VIC AB 3-0 SH 27 (SUTURE) ×4
SUT VIC AB 3-0 SH 27X BRD (SUTURE) ×4 IMPLANT
SUTURE MNCRL 4-0 27XMF (SUTURE) ×4 IMPLANT
SYR 10ML LL (SYRINGE) ×4 IMPLANT
SYR 50ML LL SCALE MARK (SYRINGE) IMPLANT
SYR BULB IRRIG 60ML STRL (SYRINGE) ×4 IMPLANT
WATER STERILE IRR 1000ML POUR (IV SOLUTION) ×4 IMPLANT

## 2018-07-03 NOTE — Anesthesia Post-op Follow-up Note (Signed)
Anesthesia QCDR form completed.        

## 2018-07-03 NOTE — Anesthesia Postprocedure Evaluation (Signed)
Anesthesia Post Note  Patient: Amy Woodard  Procedure(s) Performed: PARTIAL MASTECTOMY WITH NEEDLE LOCALIZATION AND SENTINEL LYMPH NODE BX (Left Breast) RE-EXCISION OF RIGHT BREAST TISSUE MASS (Right Breast)  Patient location during evaluation: PACU Anesthesia Type: General Level of consciousness: awake and alert Pain management: pain level controlled Vital Signs Assessment: post-procedure vital signs reviewed and stable Respiratory status: spontaneous breathing and respiratory function stable Cardiovascular status: stable Anesthetic complications: no     Last Vitals:  Vitals:   07/03/18 1739 07/03/18 1753  BP: (!) 141/64 (!) 133/49  Pulse: 84 83  Resp: 13 14  Temp: (!) 36.2 C   SpO2: 91% 95%    Last Pain:  Vitals:   07/03/18 1753  TempSrc:   PainSc: Asleep                 Torsha Lemus K

## 2018-07-03 NOTE — Transfer of Care (Signed)
Immediate Anesthesia Transfer of Care Note  Patient: Amy Woodard  Procedure(s) Performed: PARTIAL MASTECTOMY WITH NEEDLE LOCALIZATION AND SENTINEL LYMPH NODE BX (Left Breast) RE-EXCISION OF RIGHT BREAST TISSUE MASS (Right Breast)  Patient Location: PACU  Anesthesia Type:General  Level of Consciousness: sedated  Airway & Oxygen Therapy: Patient Spontanous Breathing and Patient connected to face mask oxygen  Post-op Assessment: Report given to RN and Post -op Vital signs reviewed and stable  Post vital signs: Reviewed and stable  Last Vitals:  Vitals Value Taken Time  BP 160/81 07/03/2018  3:42 PM  Temp 36.3 C 07/03/2018  3:42 PM  Pulse 84 07/03/2018  3:43 PM  Resp 18 07/03/2018  3:43 PM  SpO2 100 % 07/03/2018  3:43 PM  Vitals shown include unvalidated device data.  Last Pain:  Vitals:   07/03/18 1542  TempSrc:   PainSc: Asleep         Complications: No apparent anesthesia complications

## 2018-07-03 NOTE — Anesthesia Procedure Notes (Signed)
Procedure Name: LMA Insertion Date/Time: 07/03/2018 11:18 AM Performed by: Johnna Acosta, CRNA Pre-anesthesia Checklist: Patient identified, Emergency Drugs available, Suction available, Patient being monitored and Timeout performed Patient Re-evaluated:Patient Re-evaluated prior to induction Oxygen Delivery Method: Circle system utilized Preoxygenation: Pre-oxygenation with 100% oxygen Induction Type: IV induction LMA: LMA inserted LMA Size: 4.0 Tube type: Oral Number of attempts: 1 Placement Confirmation: positive ETCO2 and breath sounds checked- equal and bilateral Tube secured with: Tape Dental Injury: Teeth and Oropharynx as per pre-operative assessment

## 2018-07-03 NOTE — Op Note (Addendum)
Preoperative diagnosis:  bilateral breast carcinoma.  Postoperative diagnosis: same.   Procedure:  1)needle-localized left breast partial mastectomy.  2)left Axillary Sentinel Lymph node biopsy 3)Reexcision of deep margin on right partial mastectomy site  Anesthesia: GETA  Surgeon: Dr. Benjamine Sprague Assistant: Dr. Peyton Najjar  Wound Classification: Clean  Indications: Patient is a 77 y.o. female with a nonpalpable left breast mass noted on MRI with core biopsy demonstrating Ductal carcinoma requires needle-localized lumpectomy for treatment with sentinel lymph node biopsy.   She was also noted to have positive deep margins on the former right partial mastectomy site showing lobular carcinoma.  Specimen: Left breast mass breast mass, anterior medial margin, posterior margin, posterior lateral margin, Sentinel Lymph nodes x 2  Complications: None  Estimated Blood Loss: 38mL  Findings: 1. Specimen mammography shows wire but no markers.  Markers not noted in the margin specimens either.   2. Pathology call refers gross examination of left breast mass margins was questionable on the anterior medial aspect. 3. No other palpable mass or lymph node identified.   Description of procedure: Preoperative needle localization was performed by radiology. In the nuclear medicine suite, the subareolar region was injected with Tc-99 sulfur colloid. Localization studies were reviewed. The patient was taken to the operating room and placed supine on the operating table, and after general anesthesia, bilateral breast and axilla were prepped and draped in the usual sterile fashion. A time-out was completed verifying correct patient, procedure, site, positioning, and implant(s) and/or special equipment prior to beginning this procedure.  The previous right partial mastectomy incision site was opened, seroma fluid suctioned out and the posterior margin of the former biopsy site was widely excised with fascia  until underlying pectoralis muscle was visible.  This was sent fresh to pathology and they did not note any obvious abnormalities.  Attention then turned to the left breast. By comparing the localization studies with the direction and skin entry site of the needle, the probable trajectory and location of the mass was visualized. A skin incision was planned in such a way as to minimize the amount of dissection to reach the mass.  The skin incision was made after infusion of local. Flaps were raised and the location of the wire confirmed. The wire was delivered into the wound. Sharp and blunt dissection was then taken down along the guidewire. After extensive flap dissection towards the mass, the tip of the wire was not able to be visualized.  Decision was made at this point to create a counterincision directly over the  visible wire from the original incision site.  Dissection was carried down from the second incision in the inferior aspect down to the now visible and palpable tip of the wire, at which point tissue was resected to include the entire localizing needle and a margin of grossly normal tissue. The specimen and entire localizing wire were removed. The specimen was oriented with long lateral, short superior, deep double sutures and sent to radiology with the localization studies.   A hand-held gamma probe was used to identify the location of the hottest spot in the axilla. An incision was made around the caudal axillary hairline. Sharp and blunt Dissection was carried down to subdermal facias. The probe was placed within wound and again, the point of maximal count was found. Dissection continue until nodule was identified. The probe was placed in contact with the node and 1300 avg counts were recorded. The node was excised in its entirety. Ex vivo, the node measured  avg 1300 counts when placed on the probe. The bed of the node measured low 100 counts, but an additional hot spot was detected and the  second node was excised in similar fashion.  Avg count measuring 1500.  No additional hot spots were identified. No clinically abnormal nodes were palpated.   Radiology called back saying the 2 clips placed within the biopsy site were unable to be located in the mass specimen.  Pathology call back saying that there was some biopsy site changes noted in the anterior medial aspect of the original specimen.  Anterior medial margin was taken off per pathology request.  The posterior and posterior lateral margins were removed since on imaging, the clips seem to be posterior to the wires.  Unfortunately all 3 additional margins still did not have any evidence of clips, but were all grossly normal in appearance per pathology review.  Ultrasound was used within the operative field to attempt to visualize any clips but this was unsuccessful.  Filtering with the suction material as well as visual inspection of all Raytek use within the operative field also did not yield any signs of the clips.    The posterior aspect of the biopsy site at this point was through the fascia of the pectoralis.  Anterior margins were very close to the skin, where additional margins removal could potentially compromise the blood supply to it.  Due to the limited options of resecting more tissue to look for the clips, and after intraoperative consultation with Dr. Peyton Najjar who  assisted with the procedure, and agreed with findings above, decision was made at this point to go ahead and close the wound and wait for the final pathology results.  All four wounds irrigated, hemostasis was achieved and the wound closed in layers with  interrupted sutures of 3-0 Vicryl in deep dermal layer and a running subcuticular suture of Monocryl 4-0, then dressed with dermabond.  The inferior flap from the original incision on the left side where the extensive dissection was carried out had some ecchymosis but clearly viable at the end of the procedure. The  patient tolerated the procedure well and was taken to the postanesthesia care unit in stable condition. Sponge and instrument count correct at end of procedure.  After the procedure the results were explained in detail to family members, and the potential options moving forward at this point.  I explained to them that if the final pathology results do show positive margins, we will have to seriously consider a total mastectomy due to the limited amount of tissue left if they wish to proceed with more local regional control with surgery.  I will discuss the results with the patient at follow-up appointment in 1 week.

## 2018-07-03 NOTE — Interval H&P Note (Signed)
History and Physical Interval Note:  07/03/2018 10:37 AM  Amy Woodard  has presented today for surgery, with the diagnosis of LEFT BREAST CANCER (DUCTAL), RIGHT BREAST CANCER (LOBULAR)  The various methods of treatment have been discussed with the patient and family. After consideration of risks, benefits and other options for treatment, the patient has consented to  Procedure(s): PARTIAL MASTECTOMY WITH NEEDLE LOCALIZATION AND SENTINEL LYMPH NODE BX (Left) RE-EXCISION OF RIGHT BREAST TISSUE MASS (Right) as a surgical intervention .  The patient's history has been reviewed, patient examined, no change in status, stable for surgery.  I have reviewed the patient's chart and labs.  Questions were answered to the patient's satisfaction.     Sharmane Dame Lysle Pearl

## 2018-07-03 NOTE — Anesthesia Preprocedure Evaluation (Signed)
Anesthesia Evaluation  Patient identified by MRN, date of birth, ID band Patient awake    Reviewed: Allergy & Precautions, NPO status , Patient's Chart, lab work & pertinent test results, reviewed documented beta blocker date and time   Airway Mallampati: II  TM Distance: >3 FB     Dental  (+) Chipped, Partial Upper, Missing   Pulmonary shortness of breath, COPD, former smoker,           Cardiovascular      Neuro/Psych    GI/Hepatic GERD  Controlled,  Endo/Other  diabetes, Type 2Hypothyroidism   Renal/GU      Musculoskeletal  (+) Arthritis , Rheumatoid disorders,    Abdominal   Peds  Hematology   Anesthesia Other Findings Uses O2 2L at nite. EKG ok.  Reproductive/Obstetrics                             Anesthesia Physical Anesthesia Plan  ASA: III  Anesthesia Plan: General   Post-op Pain Management:    Induction: Intravenous  PONV Risk Score and Plan:   Airway Management Planned: LMA  Additional Equipment:   Intra-op Plan:   Post-operative Plan:   Informed Consent: I have reviewed the patients History and Physical, chart, labs and discussed the procedure including the risks, benefits and alternatives for the proposed anesthesia with the patient or authorized representative who has indicated his/her understanding and acceptance.     Plan Discussed with: CRNA  Anesthesia Plan Comments:         Anesthesia Quick Evaluation

## 2018-07-04 ENCOUNTER — Encounter: Payer: Self-pay | Admitting: Surgery

## 2018-07-04 DIAGNOSIS — C50312 Malignant neoplasm of lower-inner quadrant of left female breast: Secondary | ICD-10-CM | POA: Diagnosis not present

## 2018-07-04 LAB — CBC
HCT: 34.3 % — ABNORMAL LOW (ref 36.0–46.0)
HEMOGLOBIN: 10.6 g/dL — AB (ref 12.0–15.0)
MCH: 27.7 pg (ref 26.0–34.0)
MCHC: 30.9 g/dL (ref 30.0–36.0)
MCV: 89.6 fL (ref 80.0–100.0)
Platelets: 231 10*3/uL (ref 150–400)
RBC: 3.83 MIL/uL — AB (ref 3.87–5.11)
RDW: 13.2 % (ref 11.5–15.5)
WBC: 15.4 10*3/uL — ABNORMAL HIGH (ref 4.0–10.5)
nRBC: 0 % (ref 0.0–0.2)

## 2018-07-04 MED ORDER — OXYCODONE HCL 5 MG PO TABS
5.0000 mg | ORAL_TABLET | ORAL | Status: DC | PRN
  Administered 2018-07-05: 10 mg via ORAL
  Administered 2018-07-05: 5 mg via ORAL
  Administered 2018-07-05 (×2): 10 mg via ORAL
  Filled 2018-07-04 (×4): qty 2

## 2018-07-04 MED ORDER — ACETAMINOPHEN 500 MG PO TABS
1000.0000 mg | ORAL_TABLET | Freq: Four times a day (QID) | ORAL | Status: DC
  Administered 2018-07-04 – 2018-07-06 (×8): 1000 mg via ORAL
  Filled 2018-07-04 (×8): qty 2

## 2018-07-04 NOTE — Progress Notes (Signed)
07/04/2018  Subjective: Patient is 1 Day Post-Op.  Status post left breast wire localized lumpectomy and sentinel node biopsy.  She stayed overnight due to pain control issues.  This morning she reports that she had a rough night with a lot of pain in the left side particular the axilla level with swelling and some bleeding from the incision.  Vital signs: Temp:  [97.2 F (36.2 C)-98.7 F (37.1 C)] 98.4 F (36.9 C) (10/26 1206) Pulse Rate:  [80-96] 92 (10/26 1206) Resp:  [12-20] 16 (10/26 1206) BP: (122-170)/(49-81) 153/51 (10/26 1206) SpO2:  [89 %-100 %] 97 % (10/26 1206)   Intake/Output: 10/25 0701 - 10/26 0700 In: 1350 [P.O.:50; I.V.:1300] Out: 20 [Blood:20] Last BM Date: 07/02/18  Physical Exam: Constitutional: No acute distress Breast: Left breast incision from lumpectomy is clean dry and intact.  Left axillary incision from sentinel node biopsy shows a moderate size hematoma underneath the incision which feels firm at this point.  There is no evidence of active bleeding or oozing from the incision.  Labs:  Recent Labs    07/03/18 1857 07/04/18 0935  WBC 11.8* 15.4*  HGB 12.2 10.6*  HCT 39.4 34.3*  PLT 246 231   Recent Labs    07/03/18 1857  CREATININE 0.64   No results for input(s): LABPROT, INR in the last 72 hours.  Imaging: No results found.  Assessment/Plan: This is a 77 y.o. female s/p left breast wire localized lumpectomy and sentinel node biopsy.  Given this moderate size hematoma, will order a CBC today to check her hemoglobin and hematocrit.  Also will provide the patient with a surgical bra or breast binder in order to give more compression to the area and the wound as well as ice packs for decreasing the swelling.  Did discuss with the patient that this hematoma will take time to resolve on its own but at this point there is no acute surgical intervention is needed.  Surgery will be last resort unless the hematoma continues to get larger.   Melvyn Neth, [facility]

## 2018-07-04 NOTE — Progress Notes (Signed)
   07/04/18 1100  Clinical Encounter Type  Visited With Patient  Visit Type Initial  Recommendations Follow-up, as requested.  Stress Factors  Patient Stress Factors Not reviewed   Chaplain introduced Pastoral Care to the patient, who did not need PC services at this time. Chaplain reminded patient that chaplains are available continuously.

## 2018-07-05 DIAGNOSIS — C50312 Malignant neoplasm of lower-inner quadrant of left female breast: Secondary | ICD-10-CM | POA: Diagnosis not present

## 2018-07-05 LAB — CBC WITH DIFFERENTIAL/PLATELET
ABS IMMATURE GRANULOCYTES: 0.08 10*3/uL — AB (ref 0.00–0.07)
Basophils Absolute: 0.1 10*3/uL (ref 0.0–0.1)
Basophils Relative: 1 %
EOS PCT: 4 %
Eosinophils Absolute: 0.5 10*3/uL (ref 0.0–0.5)
HEMATOCRIT: 30.9 % — AB (ref 36.0–46.0)
HEMOGLOBIN: 9.4 g/dL — AB (ref 12.0–15.0)
Immature Granulocytes: 1 %
LYMPHS PCT: 10 %
Lymphs Abs: 1.3 10*3/uL (ref 0.7–4.0)
MCH: 27.7 pg (ref 26.0–34.0)
MCHC: 30.4 g/dL (ref 30.0–36.0)
MCV: 91.2 fL (ref 80.0–100.0)
MONO ABS: 0.8 10*3/uL (ref 0.1–1.0)
MONOS PCT: 7 %
NEUTROS ABS: 9.9 10*3/uL — AB (ref 1.7–7.7)
Neutrophils Relative %: 77 %
PLATELETS: 214 10*3/uL (ref 150–400)
RBC: 3.39 MIL/uL — AB (ref 3.87–5.11)
RDW: 13.2 % (ref 11.5–15.5)
WBC: 12.5 10*3/uL — AB (ref 4.0–10.5)
nRBC: 0 % (ref 0.0–0.2)

## 2018-07-05 LAB — CBC
HEMATOCRIT: 30.2 % — AB (ref 36.0–46.0)
Hemoglobin: 9.3 g/dL — ABNORMAL LOW (ref 12.0–15.0)
MCH: 27.6 pg (ref 26.0–34.0)
MCHC: 30.8 g/dL (ref 30.0–36.0)
MCV: 89.6 fL (ref 80.0–100.0)
Platelets: 228 10*3/uL (ref 150–400)
RBC: 3.37 MIL/uL — ABNORMAL LOW (ref 3.87–5.11)
RDW: 13.2 % (ref 11.5–15.5)
WBC: 13.3 10*3/uL — AB (ref 4.0–10.5)
nRBC: 0 % (ref 0.0–0.2)

## 2018-07-05 MED ORDER — POLYETHYLENE GLYCOL 3350 17 G PO PACK
17.0000 g | PACK | Freq: Every day | ORAL | Status: DC
  Administered 2018-07-05 – 2018-07-06 (×2): 17 g via ORAL
  Filled 2018-07-05 (×2): qty 1

## 2018-07-05 MED ORDER — HYDROMORPHONE HCL 1 MG/ML IJ SOLN
0.5000 mg | Freq: Once | INTRAMUSCULAR | Status: AC
  Administered 2018-07-05: 0.5 mg via INTRAVENOUS
  Filled 2018-07-05: qty 0.5

## 2018-07-05 MED ORDER — CEFAZOLIN SODIUM-DEXTROSE 1-4 GM/50ML-% IV SOLN
1.0000 g | Freq: Three times a day (TID) | INTRAVENOUS | Status: DC
  Administered 2018-07-05 – 2018-07-06 (×3): 1 g via INTRAVENOUS
  Filled 2018-07-05 (×5): qty 50

## 2018-07-05 NOTE — Progress Notes (Signed)
Patient continues to verbalize a lot of discomfort to her left axilla area.  Site is very swollen.  Corset was replaced.  Unsure if this is new swelling or just previous swelling that is being pushed up from corset.  Dr Hampton Abbot notified. CBC and a once of dilaudid was ordered

## 2018-07-05 NOTE — Progress Notes (Signed)
07/05/2018  Subjective: Patient is 2 Days Post-Op.  Continues to have pain in the left axillary area due to the hematoma.  Her H/H did decrease a bit more this morning, but then a repeat H/H was obtained almost 8 hours later and was stable.  This later one was obtained as she was having more pain mid-morning.  Swelling has remained stable but her WBC did worse a bit.  Denies any fevers.  Vital signs: Temp:  [98.3 F (36.8 C)-98.6 F (37 C)] 98.3 F (36.8 C) (10/27 1223) Pulse Rate:  [86-107] 107 (10/27 1223) Resp:  [18-19] 19 (10/27 1223) BP: (114-149)/(47-80) 149/80 (10/27 1223) SpO2:  [94 %-96 %] 94 % (10/27 1223)   Intake/Output: 10/26 0701 - 10/27 0700 In: 500 [P.O.:500] Out: 750 [Urine:750] Last BM Date: 07/03/18  Physical Exam: Constitutional: No acute distress Breast:  Left breast incision clean, dry, with no evidence of infection.  Left axillary wound with hematoma that appears stable in size, without any active bleeding, but with some erythema at the incision itself.  Labs:  Recent Labs    07/05/18 0417 07/05/18 1149  WBC 12.5* 13.3*  HGB 9.4* 9.3*  HCT 30.9* 30.2*  PLT 214 228   Recent Labs    07/03/18 1857  CREATININE 0.64   No results for input(s): LABPROT, INR in the last 72 hours.  Imaging: No results found.  Assessment/Plan: This is a 77 y.o. female s/p left breast wire localized lumpectomy and sentinel node biopsy.  --Hematoma appears stable in size.  Her H/H did drop this morning compared to yesterday but has remained stable on repeat check. --Patient took off her breast binder last night due to discomfort with it, but I encouraged patient to keep it on at all times as this will help with compression and tamponade as well as decrease swelling.  Can also use ice packs.  Unfortuantely no NSAID use because of risk of bleeding for now.   --Given the mild erythema, will start prophylactic Ancef.   --Patient to stay in house another day and will assess in  the morning again.   Melvyn Neth, [facility]

## 2018-07-05 NOTE — Care Management Obs Status (Signed)
[facility] NOTIFICATION   Patient Details  Name: Amy Woodard MRN: [id number] Date of Birth: Feb 01, 1941   Medicare Observation Status Notification Given:  Yes    Vedika Dumlao A Reise Gladney, RN 07/05/2018, 3:14 PM

## 2018-07-05 NOTE — Consult Note (Signed)
Pharmacy Antibiotic Note  Amy Woodard is a 77 y.o. female admitted on 07/03/2018 with cellulitis.  Pharmacy has been consulted for cefazolin dosing.  Plan: Cefazolin 1 gm IV every 8 hours  Height: 5\' 9"  (175.3 cm) Weight: 163 lb (73.9 kg) IBW/kg (Calculated) : 66.2  Temp (24hrs), Avg:98.4 F (36.9 C), Min:98.3 F (36.8 C), Max:98.6 F (37 C)  Recent Labs  Lab 07/03/18 1857 07/04/18 0935 07/05/18 0417 07/05/18 1149  WBC 11.8* 15.4* 12.5* 13.3*  CREATININE 0.64  --   --   --     Estimated Creatinine Clearance: 62.5 mL/min (by C-G formula based on SCr of 0.64 mg/dL).    Allergies  Allergen Reactions  . Sulfa Antibiotics Itching    Antimicrobials this admission: Cefazolin 10/27 >>    Dose adjustments this admission:   Microbiology results:  BCx:   UCx:    Sputum:    MRSA PCR:   Thank you for allowing pharmacy to be a part of this patient's care.  Forrest Moron, PharmD 07/05/2018 4:01 PM

## 2018-07-06 DIAGNOSIS — C50312 Malignant neoplasm of lower-inner quadrant of left female breast: Secondary | ICD-10-CM | POA: Diagnosis not present

## 2018-07-06 NOTE — Progress Notes (Signed)
Pt discharged per MD order. IV removed. Discharge instructions reviewed with pt. All questions answered to pt satisfaction. Pt discharged with son. Taken to car in wheelchair by Psychologist, occupational.

## 2018-07-06 NOTE — Discharge Instructions (Signed)
Breast Biopsy, Care After These instructions give you information about caring for yourself after your procedure. Your doctor may also give you more specific instructions. Call your doctor if you have any problems or questions after your procedure. Follow these instructions at home: Medicines  tylenol and celebrex as needed for discomfort.  Use narcotics, if prescribed, only when tylenol and celebrex is not enough to control pain.  325-650mg  every 8hrs to max of 4000mg /24hrs for the tylenol.     Do not drive for 24 hours if you received a sedative.  Do not drink alcohol while taking pain medicine.  Do not drive or use heavy machinery while taking prescription pain medicine. Biopsy Site Care   Follow instructions from your doctor about how to take care of your cut from surgery (incision) or puncture area. Make sure you: ? Wash your hands with soap and water before you change your bandage. If you cannot use soap and water, use hand sanitizer. ? Change any bandages (dressings) as told by your doctor. ? Leave any stitches (sutures), skin glue, or skin tape (adhesive) strips in place. They may need to stay in place for 2 weeks or longer. If tape strips get loose and curl up, you may trim the loose edges. Do not remove tape strips completely unless your doctor says it is okay.  If you have stitches, keep them dry when you take a bath or a shower.  Check your cut or puncture area every day for signs of infection. Check for: ? More redness, swelling, or pain. ? More fluid or blood. ? Warmth. ? Pus or a bad smell.  Protect the biopsy area. Do not let the area get bumped. Activity  Avoid activities that could pull the biopsy site open. ? Avoid stretching. ? Avoid reaching. ? Avoid exercise. ? Avoid sports.  Avoid lifting >10 lbs, pushing/pulling >30lbs. General instructions  Continue your normal diet.  Wear a good support bra for as long as told by your doctor.  Get checked for  extra fluid in your body (lymphedema) as often as told by your doctor.  Keep all follow-up visits as told by your doctor. This is important. Contact a health care provider if:  You have more redness, swelling, or pain at the biopsy site.  You have more fluid or blood coming from your biopsy site.  Your biopsy site feels warm to the touch.  You have pus or a bad smell coming from the biopsy site.  Your biopsy site breaks open after the stitches, staples, or skin tape strips have been removed.  You have a rash.  You have a fever. Get help right away if:  You have more bleeding (more than a small spot) from the biopsy site.  You have trouble breathing.  You have red streaks around the biopsy site. This information is not intended to replace advice given to you by your health care provider. Make sure you discuss any questions you have with your health care provider. Document Released: 06/22/2009 Document Revised: 05/02/2016 Document Reviewed: 05/30/2015 Elsevier Interactive Patient Education  2018 [employer].

## 2018-07-06 NOTE — Discharge Summary (Signed)
Physician Discharge Summary  Patient ID: Amy Woodard MRN: [id number] DOB/AGE: 05/28/1941 77 y.o.  Admit date: 07/03/2018 Discharge date: 07/06/2018  Admission Diagnoses: left breast CA  Discharge Diagnoses:  Same as above  Discharged Condition: good  Hospital Course: Patient admitted under observation for pain control status post left breast partial mastectomy please see op note for details.  She was noted to have a hematoma at the sentinel lymph node biopsy site so serial hemoglobin levels were taken and stabilized after couple days.  At time of discharge she states the area is sore but tolerable with oral pain meds, therefore she was deemed appropriate for discharge with close outpatient follow-up.  Consults: none  Discharge Exam: Blood pressure (!) 171/74, pulse 93, temperature 98.1 F (36.7 C), temperature source Oral, resp. rate 19, height 5\' 9"  (1.753 m), weight 73.9 kg, SpO2 96 %. General appearance: alert, cooperative and no distress Chest wall: no tenderness, left sided chest wall tenderness, Large bruising noted around the lymph node incision site from the hematoma Breasts: Tenderness along both incisions which are clean dry and intact no evidence of a seroma formation  Disposition:  Discharge disposition: 01-Home or Self Care       Discharge Instructions    Discharge patient   Complete by:  As directed    Discharge disposition:  01-Home or Self Care   Discharge patient date:  07/03/2018   Discharge patient   Complete by:  As directed    Discharge disposition:  01-Home or Self Care   Discharge patient date:  07/06/2018     Allergies as of 07/06/2018      Reactions   Sulfa Antibiotics Itching      Medication List    STOP taking these medications   ibuprofen 800 MG tablet Commonly known as:  ADVIL,MOTRIN     TAKE these medications   acetaminophen 650 MG CR tablet Commonly known as:  TYLENOL Take 1 tablet (650 mg total) by mouth every 8 (eight)  hours as needed for pain.   azelastine 0.1 % nasal spray Commonly known as:  ASTELIN Place 1 spray into both nostrils 2 (two) times daily. Use in each nostril as directed   celecoxib 200 MG capsule Commonly known as:  CELEBREX Take 1 capsule (200 mg total) by mouth daily as needed for mild pain or moderate pain.   docusate sodium 100 MG capsule Commonly known as:  COLACE Take 1 capsule (100 mg total) by mouth 2 (two) times daily as needed for up to 10 days for mild constipation.   folic acid 1 MG tablet Commonly known as:  FOLVITE Take 1 mg by mouth daily.   glipiZIDE 5 MG tablet Commonly known as:  GLUCOTROL Take 5 mg by mouth daily before breakfast.   hydroxychloroquine 200 MG tablet Commonly known as:  PLAQUENIL Take 400 mg by mouth daily.   Ipratropium-Albuterol 20-100 MCG/ACT Aers respimat Commonly known as:  COMBIVENT Inhale 1 puff into the lungs every 6 (six) hours as needed for wheezing or shortness of breath.   levothyroxine 50 MCG tablet Commonly known as:  SYNTHROID, LEVOTHROID Take 50 mcg by mouth daily before breakfast.   oxyCODONE-acetaminophen 5-325 MG tablet Commonly known as:  PERCOCET/ROXICET Take 1 tablet by mouth every 4 (four) hours as needed for severe pain.   SYMBICORT 160-4.5 MCG/ACT inhaler Generic drug:  budesonide-formoterol Inhale 2 puffs into the lungs 2 (two) times daily.      Follow-up Information    [place], Meily Glowacki, DO.  Go on 07/17/2018.   Specialty:  Surgery Why:  Friday November the 8th at 11am for a follow-up  Contact information: [address] [place] [address] [phone]            Total time spent arranging discharge was >62min. Signed: Benjamine Sprague 07/06/2018, 4:49 PM/c

## 2018-07-07 ENCOUNTER — Encounter: Payer: Self-pay | Admitting: *Deleted

## 2018-07-07 ENCOUNTER — Other Ambulatory Visit: Payer: Self-pay | Admitting: Oncology

## 2018-07-07 LAB — SURGICAL PATHOLOGY

## 2018-07-07 NOTE — Progress Notes (Signed)
  Oncology Nurse Navigator Documentation  Navigator Location: [facility] On[facility] (07/07/18 0900)   )Navigator Encounter Type: Telephone (07/07/18 0900) Telephone: Outgoing Call (07/07/18 0900)     Surgery Date: 07/03/18 (07/07/18 0900)                                            Time Spent with Patient: 15 (07/07/18 0900)   Called patient to follow-up post surgery.  States "I'm a little sore".  States she is taking her pain meds.  She has follow-up with Dr. Lysle Pearl on 07/17/18.  She is to let me know when she is released from Dr. Lysle Pearl and is schedule to return to Dr. Tasia Catchings.

## 2018-07-08 ENCOUNTER — Other Ambulatory Visit: Payer: Self-pay

## 2018-07-08 NOTE — Patient Outreach (Addendum)
[facility]) Care Management  [place]   07/08/2018  MINTIE WITHERINGTON 31-Jul-1941 [id number]   77 year old female outreached by [facility] services for a 30 day post discharge medication review.  PMHx includes, but not limited to, COPD, type 2 diabetes mellitus, hypothyroidism, Rheumatoid arthritis, hypercholesterolemia, restless leg syndrome and breast cancer (recent Left partial mastetcomy).  Successful outreach to Ms. Amy Woodard and HIPAA identifiers verified.  Subjective: Ms. Cardella reports that she is in pain, but that it is better than when she was in the hospital.  She reports taking about one Percocet day.  She states that she is applying ice packs to her hematoma and that she has an appointment with her surgeon on Friday.  She states that she does not routinely check her glucose because it is always good.  She reports that she has a decreased appetite.  She states that she is able to afford her prescriptions.   Objective:  SCr 0.64 mg/dL HgA1c 6.6% on 05/05/17 per [facility]  Current Medications: Current Outpatient Medications  Medication Sig Dispense Refill  . azelastine (ASTELIN) 0.1 % nasal spray Place 1 spray into both nostrils 2 (two) times daily. Use in each nostril as directed    . budesonide-formoterol (SYMBICORT) 160-4.5 MCG/ACT inhaler Inhale 2 puffs into the lungs 2 (two) times daily.     . celecoxib (CELEBREX) 200 MG capsule Take 1 capsule (200 mg total) by mouth daily as needed for mild pain or moderate pain. 30 capsule 0  . docusate sodium (COLACE) 100 MG capsule Take 1 capsule (100 mg total) by mouth 2 (two) times daily as needed for up to 10 days for mild constipation. 20 capsule 0  . folic acid (FOLVITE) 1 MG tablet Take 1 mg by mouth daily.    [name] glipiZIDE (GLUCOTROL) 5 MG tablet Take 5 mg by mouth daily before breakfast.    . hydroxychloroquine (PLAQUENIL) 200 MG tablet Take 400 mg by mouth daily.     . Ipratropium-Albuterol (COMBIVENT) 20-100  MCG/ACT AERS respimat Inhale 1 puff into the lungs every 6 (six) hours as needed for wheezing or shortness of breath.     . levothyroxine (SYNTHROID, LEVOTHROID) 50 MCG tablet Take 50 mcg by mouth daily before breakfast.    . oxyCODONE-acetaminophen (PERCOCET) 5-325 MG tablet Take 1 tablet by mouth every 4 (four) hours as needed for severe pain. 10 tablet 0  . acetaminophen (TYLENOL) 650 MG CR tablet Take 1 tablet (650 mg total) by mouth every 8 (eight) hours as needed for pain. (Patient not taking: Reported on 07/08/2018) 30 tablet 0   No current facility-administered medications for this visit.     Functional Status: In your present state of health, do you have any difficulty performing the following activities: 07/03/2018 07/03/2018  Hearing? - N  Vision? - N  Difficulty concentrating or making decisions? - N  Walking or climbing stairs? - N  Dressing or bathing? - Y  Doing errands, shopping? N -  Some recent data might be hidden    Fall/Depression Screening: No flowsheet data found. No flowsheet data found.  ASSESSMENT: Date Discharged from Hospital: 07/06/18 Date Medication Reconciliation Performed: 07/08/2018  Medications Discontinued at Discharge:   Ibuprofen  New Medications at Discharge:   Oxycodone/APAP   Docusate  Acetaminophen  Celecoxib  Patient was recently discharged from hospital and all medications have been reviewed  Drugs sorted by system:  Pulmonary/Allergy: azelastine, budesonide/formoterol, ipratropium/albuterol  Gastrointestinal: docusate  Endocrine: glipizide, levothyroxine  Pain: acetaminophen, celecoxib, oxycodone/APAP   Vitamins/Minerals: folic acid  Miscellaneous: hydroxychloroquine  Gaps in therapy:  Patient with type 2 diabetes mellitus and not an ACEI or ARB for nephroprotection  Other issues noted:  Counseled patient to use her medications to keep her pain controlled, instead of trying to regain control once it has become  severe, which is more difficult to do.   Informed her to use her medications as prescribed to reduce the pain she is feeling.  She reports that she has not taken any Celecoxib or plain acetaminophen.  Reviewed how Celecoxib is good for inflammation and swelling (which she describes as having), while oxycodone only works for pain.   PLAN: Route note to PCP, Dr. Edwina Barth.  Joetta Manners, PharmD Clinical Pharmacist [place] [phone]

## 2018-07-10 ENCOUNTER — Ambulatory Visit: Payer: PPO | Admitting: Certified Registered Nurse Anesthetist

## 2018-07-10 ENCOUNTER — Other Ambulatory Visit: Payer: Self-pay

## 2018-07-10 ENCOUNTER — Ambulatory Visit: Payer: Self-pay | Admitting: Surgery

## 2018-07-10 ENCOUNTER — Encounter
Admission: RE | Disposition: A | Discharge status: Home / Self Care | Payer: Self-pay | Source: Ambulatory Visit | Attending: Surgery

## 2018-07-10 ENCOUNTER — Inpatient Hospital Stay
Admission: RE | Admit: 2018-07-10 | Discharge: 2018-07-13 | DRG: 909 | Disposition: A | Discharge status: Home / Self Care | Payer: PPO | Source: Ambulatory Visit | Attending: Surgery | Admitting: Surgery

## 2018-07-10 ENCOUNTER — Encounter: Payer: Self-pay | Admitting: Anesthesiology

## 2018-07-10 DIAGNOSIS — Y838 Other surgical procedures as the cause of abnormal reaction of the patient, or of later complication, without mention of misadventure at the time of the procedure: Secondary | ICD-10-CM | POA: Diagnosis present

## 2018-07-10 DIAGNOSIS — S40022A Contusion of left upper arm, initial encounter: Secondary | ICD-10-CM | POA: Diagnosis not present

## 2018-07-10 DIAGNOSIS — E119 Type 2 diabetes mellitus without complications: Secondary | ICD-10-CM | POA: Diagnosis not present

## 2018-07-10 DIAGNOSIS — J449 Chronic obstructive pulmonary disease, unspecified: Secondary | ICD-10-CM | POA: Diagnosis not present

## 2018-07-10 DIAGNOSIS — S20212A Contusion of left front wall of thorax, initial encounter: Secondary | ICD-10-CM | POA: Diagnosis not present

## 2018-07-10 DIAGNOSIS — Z170 Estrogen receptor positive status [ER+]: Secondary | ICD-10-CM

## 2018-07-10 DIAGNOSIS — E7800 Pure hypercholesterolemia, unspecified: Secondary | ICD-10-CM | POA: Diagnosis not present

## 2018-07-10 DIAGNOSIS — C50911 Malignant neoplasm of unspecified site of right female breast: Secondary | ICD-10-CM | POA: Diagnosis present

## 2018-07-10 DIAGNOSIS — S20219A Contusion of unspecified front wall of thorax, initial encounter: Secondary | ICD-10-CM | POA: Diagnosis present

## 2018-07-10 DIAGNOSIS — S20212D Contusion of left front wall of thorax, subsequent encounter: Secondary | ICD-10-CM

## 2018-07-10 DIAGNOSIS — C50312 Malignant neoplasm of lower-inner quadrant of left female breast: Secondary | ICD-10-CM | POA: Diagnosis present

## 2018-07-10 DIAGNOSIS — L7632 Postprocedural hematoma of skin and subcutaneous tissue following other procedure: Secondary | ICD-10-CM | POA: Diagnosis not present

## 2018-07-10 DIAGNOSIS — K219 Gastro-esophageal reflux disease without esophagitis: Secondary | ICD-10-CM | POA: Diagnosis not present

## 2018-07-10 DIAGNOSIS — Z882 Allergy status to sulfonamides status: Secondary | ICD-10-CM | POA: Diagnosis not present

## 2018-07-10 DIAGNOSIS — E034 Atrophy of thyroid (acquired): Secondary | ICD-10-CM | POA: Diagnosis not present

## 2018-07-10 HISTORY — PX: IRRIGATION AND DEBRIDEMENT HEMATOMA: SHX5254

## 2018-07-10 LAB — GLUCOSE, CAPILLARY
GLUCOSE-CAPILLARY: 86 mg/dL (ref 70–99)
Glucose-Capillary: 100 mg/dL — ABNORMAL HIGH (ref 70–99)
Glucose-Capillary: 77 mg/dL (ref 70–99)

## 2018-07-10 SURGERY — IRRIGATION AND DEBRIDEMENT HEMATOMA
Anesthesia: General | Laterality: Left

## 2018-07-10 MED ORDER — FAMOTIDINE 20 MG PO TABS
20.0000 mg | ORAL_TABLET | Freq: Once | ORAL | Status: AC
  Administered 2018-07-10: 20 mg via ORAL

## 2018-07-10 MED ORDER — ONDANSETRON 4 MG PO TBDP: 4.0000 mg | ORAL_TABLET | Freq: Four times a day (QID) | ORAL | Status: DC | PRN

## 2018-07-10 MED ORDER — MIDAZOLAM HCL 2 MG/2ML IJ SOLN
INTRAMUSCULAR | Status: AC
  Filled 2018-07-10: qty 2

## 2018-07-10 MED ORDER — ACETAMINOPHEN 500 MG PO TABS
ORAL_TABLET | ORAL | Status: AC
  Administered 2018-07-10: 1000 mg via ORAL
  Filled 2018-07-10: qty 2

## 2018-07-10 MED ORDER — CEFAZOLIN SODIUM-DEXTROSE 2-4 GM/100ML-% IV SOLN
2.0000 g | INTRAVENOUS | Status: AC
  Administered 2018-07-10: 2 g via INTRAVENOUS

## 2018-07-10 MED ORDER — FENTANYL CITRATE (PF) 100 MCG/2ML IJ SOLN
INTRAMUSCULAR | Status: AC
  Administered 2018-07-10: 25 ug via INTRAVENOUS
  Filled 2018-07-10: qty 2

## 2018-07-10 MED ORDER — CHLORHEXIDINE GLUCONATE CLOTH 2 % EX PADS: 6.0000 | MEDICATED_PAD | Freq: Once | CUTANEOUS | Status: DC

## 2018-07-10 MED ORDER — INSULIN ASPART 100 UNIT/ML ~~LOC~~ SOLN
0.0000 [IU] | Freq: Three times a day (TID) | SUBCUTANEOUS | Status: DC
  Administered 2018-07-11: 2 [IU] via SUBCUTANEOUS
  Administered 2018-07-11: 1 [IU] via SUBCUTANEOUS
  Administered 2018-07-12: 2 [IU] via SUBCUTANEOUS
  Filled 2018-07-10 (×3): qty 1

## 2018-07-10 MED ORDER — GABAPENTIN 300 MG PO CAPS
ORAL_CAPSULE | ORAL | Status: AC
  Administered 2018-07-10: 300 mg via ORAL
  Filled 2018-07-10: qty 1

## 2018-07-10 MED ORDER — GABAPENTIN 300 MG PO CAPS
300.0000 mg | ORAL_CAPSULE | ORAL | Status: AC
  Administered 2018-07-10: 300 mg via ORAL

## 2018-07-10 MED ORDER — FENTANYL CITRATE (PF) 100 MCG/2ML IJ SOLN
INTRAMUSCULAR | Status: AC
  Filled 2018-07-10: qty 2

## 2018-07-10 MED ORDER — MORPHINE SULFATE (PF) 2 MG/ML IV SOLN: 2.0000 mg | INTRAVENOUS | Status: DC | PRN

## 2018-07-10 MED ORDER — FOLIC ACID 1 MG PO TABS
1.0000 mg | ORAL_TABLET | Freq: Every day | ORAL | Status: DC
  Administered 2018-07-11 – 2018-07-13 (×3): 1 mg via ORAL
  Filled 2018-07-10 (×3): qty 1

## 2018-07-10 MED ORDER — ONDANSETRON HCL 4 MG/2ML IJ SOLN: 4.0000 mg | Freq: Four times a day (QID) | INTRAMUSCULAR | Status: DC | PRN

## 2018-07-10 MED ORDER — ORAL CARE MOUTH RINSE
15.0000 mL | Freq: Two times a day (BID) | OROMUCOSAL | Status: DC
  Administered 2018-07-10 – 2018-07-13 (×4): 15 mL via OROMUCOSAL

## 2018-07-10 MED ORDER — LEVOTHYROXINE SODIUM 50 MCG PO TABS
50.0000 ug | ORAL_TABLET | Freq: Every day | ORAL | Status: DC
  Administered 2018-07-11 – 2018-07-13 (×3): 50 ug via ORAL
  Filled 2018-07-10 (×3): qty 1

## 2018-07-10 MED ORDER — IPRATROPIUM-ALBUTEROL 0.5-2.5 (3) MG/3ML IN SOLN: 3.0000 mL | Freq: Four times a day (QID) | RESPIRATORY_TRACT | Status: DC | PRN

## 2018-07-10 MED ORDER — PROPOFOL 10 MG/ML IV BOLUS
INTRAVENOUS | Status: AC
  Filled 2018-07-10: qty 20

## 2018-07-10 MED ORDER — DOCUSATE SODIUM 100 MG PO CAPS: 100.0000 mg | ORAL_CAPSULE | Freq: Two times a day (BID) | ORAL | Status: DC | PRN

## 2018-07-10 MED ORDER — SODIUM CHLORIDE 0.9 % IV SOLN
INTRAVENOUS | Status: DC
  Administered 2018-07-10 (×2): via INTRAVENOUS

## 2018-07-10 MED ORDER — FENTANYL CITRATE (PF) 100 MCG/2ML IJ SOLN
25.0000 ug | INTRAMUSCULAR | Status: DC | PRN
  Administered 2018-07-10 (×4): 25 ug via INTRAVENOUS

## 2018-07-10 MED ORDER — MOMETASONE FURO-FORMOTEROL FUM 200-5 MCG/ACT IN AERO
2.0000 | INHALATION_SPRAY | Freq: Two times a day (BID) | RESPIRATORY_TRACT | Status: DC
  Administered 2018-07-10 – 2018-07-13 (×6): 2 via RESPIRATORY_TRACT
  Filled 2018-07-10: qty 8.8

## 2018-07-10 MED ORDER — BUPIVACAINE-EPINEPHRINE (PF) 0.5% -1:200000 IJ SOLN
INTRAMUSCULAR | Status: AC
  Filled 2018-07-10: qty 30

## 2018-07-10 MED ORDER — FAMOTIDINE 20 MG PO TABS
ORAL_TABLET | ORAL | Status: AC
  Administered 2018-07-10: 20 mg via ORAL
  Filled 2018-07-10: qty 1

## 2018-07-10 MED ORDER — MIDAZOLAM HCL 2 MG/2ML IJ SOLN
INTRAMUSCULAR | Status: DC | PRN
  Administered 2018-07-10: 2 mg via INTRAVENOUS

## 2018-07-10 MED ORDER — LIDOCAINE HCL (PF) 2 % IJ SOLN
INTRAMUSCULAR | Status: DC | PRN
  Administered 2018-07-10: 40 mg via INTRADERMAL

## 2018-07-10 MED ORDER — AZELASTINE HCL 0.1 % NA SOLN
1.0000 | Freq: Two times a day (BID) | NASAL | Status: DC
  Administered 2018-07-11 – 2018-07-13 (×4): 1 via NASAL
  Filled 2018-07-10: qty 30

## 2018-07-10 MED ORDER — ONDANSETRON HCL 4 MG/2ML IJ SOLN: 4.0000 mg | Freq: Once | INTRAMUSCULAR | Status: DC | PRN

## 2018-07-10 MED ORDER — BUPIVACAINE-EPINEPHRINE 0.5% -1:200000 IJ SOLN
INTRAMUSCULAR | Status: DC | PRN
  Administered 2018-07-10: 13 mL

## 2018-07-10 MED ORDER — BUPIVACAINE-EPINEPHRINE (PF) 0.25% -1:200000 IJ SOLN
INTRAMUSCULAR | Status: AC
  Filled 2018-07-10: qty 30

## 2018-07-10 MED ORDER — GLIPIZIDE 5 MG PO TABS
5.0000 mg | ORAL_TABLET | Freq: Every day | ORAL | Status: DC
  Administered 2018-07-11 – 2018-07-13 (×3): 5 mg via ORAL
  Filled 2018-07-10 (×3): qty 1

## 2018-07-10 MED ORDER — PROPOFOL 500 MG/50ML IV EMUL
INTRAVENOUS | Status: DC | PRN
  Administered 2018-07-10: 50 ug/kg/min via INTRAVENOUS

## 2018-07-10 MED ORDER — OXYCODONE-ACETAMINOPHEN 5-325 MG PO TABS
1.0000 | ORAL_TABLET | ORAL | Status: DC | PRN
  Administered 2018-07-11 (×3): 1 via ORAL
  Filled 2018-07-10 (×3): qty 1

## 2018-07-10 MED ORDER — FENTANYL CITRATE (PF) 100 MCG/2ML IJ SOLN
INTRAMUSCULAR | Status: DC | PRN
  Administered 2018-07-10: 50 ug via INTRAVENOUS
  Administered 2018-07-10 (×2): 25 ug via INTRAVENOUS

## 2018-07-10 MED ORDER — ACETAMINOPHEN 500 MG PO TABS
1000.0000 mg | ORAL_TABLET | ORAL | Status: AC
  Administered 2018-07-10: 1000 mg via ORAL

## 2018-07-10 MED ORDER — ACETAMINOPHEN 500 MG PO TABS
1000.0000 mg | ORAL_TABLET | Freq: Four times a day (QID) | ORAL | Status: DC
  Administered 2018-07-10 – 2018-07-13 (×8): 1000 mg via ORAL
  Filled 2018-07-10 (×9): qty 2

## 2018-07-10 MED ORDER — CEFAZOLIN SODIUM-DEXTROSE 2-4 GM/100ML-% IV SOLN
INTRAVENOUS | Status: AC
  Filled 2018-07-10: qty 100

## 2018-07-10 SURGICAL SUPPLY — 32 items
BLADE SURG 15 STRL LF DISP TIS (BLADE) ×1 IMPLANT
BLADE SURG 15 STRL SS (BLADE) ×2
CANISTER SUCT 1200ML W/VALVE (MISCELLANEOUS) ×3 IMPLANT
CHLORAPREP W/TINT 26ML (MISCELLANEOUS) ×3 IMPLANT
COVER WAND RF STERILE (DRAPES) ×3 IMPLANT
DRAPE LAPAROTOMY 100X77 ABD (DRAPES) ×3 IMPLANT
DRSG OPSITE POSTOP 4X6 (GAUZE/BANDAGES/DRESSINGS) ×3 IMPLANT
ELECT BLADE 6.5 EXT (BLADE) ×3 IMPLANT
ELECT REM PT RETURN 9FT ADLT (ELECTROSURGICAL) ×3
ELECTRODE REM PT RTRN 9FT ADLT (ELECTROSURGICAL) ×1 IMPLANT
GLOVE BIOGEL PI IND STRL 7.0 (GLOVE) ×1 IMPLANT
GLOVE BIOGEL PI INDICATOR 7.0 (GLOVE) ×2
GLOVE SURG SYN 7.0 (GLOVE) ×6 IMPLANT
GOWN STRL REUS W/ TWL LRG LVL3 (GOWN DISPOSABLE) ×2 IMPLANT
GOWN STRL REUS W/TWL LRG LVL3 (GOWN DISPOSABLE) ×4
HEMOSTAT ARISTA ABSORB 3G PWDR (MISCELLANEOUS) ×3 IMPLANT
LABEL OR SOLS (LABEL) ×3 IMPLANT
LIGHT WAVEGUIDE WIDE FLAT (MISCELLANEOUS) ×3 IMPLANT
NEEDLE HYPO 22GX1.5 SAFETY (NEEDLE) ×3 IMPLANT
NS IRRIG 500ML POUR BTL (IV SOLUTION) ×3 IMPLANT
PACK BASIN MINOR ARMC (MISCELLANEOUS) ×3 IMPLANT
SPONGE LAP 18X18 RF (DISPOSABLE) ×3 IMPLANT
STAPLER SKIN PROX 35W (STAPLE) ×3 IMPLANT
SUT MNCRL 4-0 (SUTURE)
SUT MNCRL 4-0 27XMFL (SUTURE)
SUT VIC AB 2-0 CT2 27 (SUTURE) IMPLANT
SUT VIC AB 3-0 SH 27 (SUTURE) ×2
SUT VIC AB 3-0 SH 27X BRD (SUTURE) ×1 IMPLANT
SUTURE MNCRL 4-0 27XMF (SUTURE) IMPLANT
SYR 10ML LL (SYRINGE) ×3 IMPLANT
SYR BULB IRRIG 60ML STRL (SYRINGE) ×3 IMPLANT
TOWEL OR 17X26 4PK STRL BLUE (TOWEL DISPOSABLE) ×3 IMPLANT

## 2018-07-10 NOTE — Anesthesia Post-op Follow-up Note (Signed)
Anesthesia QCDR form completed.        

## 2018-07-10 NOTE — Op Note (Signed)
Preoperative diagnosis: Left axillary postoperative hematoma Postoperative diagnosis: same  Procedure: Incision and drainage of left axillary hematoma  Anesthesia: MAC  Surgeon: Lysle Pearl  Wound Classification: Contaminated  Indications: Patient is a 77 y.o. female  presented with left axillary hematoma.  See H&P for further details.  Specimen: none  Complications: None  Estimated Blood Loss: 30m  Findings:  1.  Large hematoma underneath the left axillary incision site. 2.  Small venous hemorrhaging noted at the base of wound 3. Adequate hemostasis with LigaSure electrocautery and Arista application  Description of procedure: The patient was placed in the supine position and MAC anesthesia was induced. The area was prepped and draped in the usual sterile fashion. A timeout was completed verifying correct patient, procedure, site, positioning, and implant(s) and/or special equipment prior to beginning this procedure.  Local infused over old incision site.  Inicision made and copious amounts of old hematoma was drained. With a hemostat blunt dissection of deeper hematomas performed to evacuate all clots.  Inspection of the former operative field noted a persistent venous hemorrhaging at the base of the wound.  The vein was ligated using LigaSure device.  Small superficial oozing was all controlled with electrocautery.  Wound then extensively irrigated, one last inspection of the entire operative field did not note any further bleeding.  At this point Arista powder was infused through the entire operative field to further ensure no future bleeding.  Overlying skin was then closed with 3-0 Vicryl in an interrupted fashion and skin loosely closed with staples.  the patient tolerated the procedure well and was taken to the postanesthesia care unit in satisfactory condition.

## 2018-07-10 NOTE — Anesthesia Preprocedure Evaluation (Signed)
Anesthesia Evaluation  Patient identified by MRN, date of birth, ID band Patient awake    Reviewed: Allergy & Precautions, NPO status , Patient's Chart, lab work & pertinent test results, reviewed documented beta blocker date and time   Airway Mallampati: II  TM Distance: >3 FB     Dental  (+) Chipped   Pulmonary shortness of breath, COPD,  oxygen dependent, former smoker,           Cardiovascular      Neuro/Psych    GI/Hepatic GERD  Controlled,  Endo/Other  diabetes, Type 2Hypothyroidism   Renal/GU Renal disease     Musculoskeletal  (+) Arthritis , Rheumatoid disorders,    Abdominal   Peds  Hematology   Anesthesia Other Findings Chronic low sats. 95%.  Reproductive/Obstetrics                             Anesthesia Physical Anesthesia Plan  ASA: III  Anesthesia Plan: General   Post-op Pain Management:    Induction: Intravenous  PONV Risk Score and Plan:   Airway Management Planned: Oral ETT and LMA  Additional Equipment:   Intra-op Plan:   Post-operative Plan:   Informed Consent: I have reviewed the patients History and Physical, chart, labs and discussed the procedure including the risks, benefits and alternatives for the proposed anesthesia with the patient or authorized representative who has indicated his/her understanding and acceptance.     Plan Discussed with: CRNA  Anesthesia Plan Comments:         Anesthesia Quick Evaluation

## 2018-07-10 NOTE — Progress Notes (Signed)
Per Dr Lysle Pearl can put IV on right side

## 2018-07-10 NOTE — H&P (Deleted)
  The note originally documented on this encounter has been moved the the encounter in which it belongs.  

## 2018-07-10 NOTE — Transfer of Care (Signed)
Immediate Anesthesia Transfer of Care Note  Patient: Amy Woodard  Procedure(s) Performed: IRRIGATION AND DEBRIDEMENT HEMATOMA LEFT AXILLARY (Left )  Patient Location: PACU  Anesthesia Type:General  Level of Consciousness: awake  Airway & Oxygen Therapy: Patient Spontanous Breathing  Post-op Assessment: Report given to RN  Post vital signs: stable  Last Vitals:  Vitals Value Taken Time  BP 121/48 07/10/2018  7:45 PM  Temp    Pulse 88 07/10/2018  7:47 PM  Resp 20 07/10/2018  7:47 PM  SpO2 97 % 07/10/2018  7:47 PM  Vitals shown include unvalidated device data.  Last Pain:  Vitals:   07/10/18 1405  TempSrc: Temporal  PainSc: 10-Worst pain ever         Complications: No apparent anesthesia complications

## 2018-07-10 NOTE — H&P (Signed)
Subjective:   CC: Malignant neoplasm of lower-inner quadrant of left breast in female, estrogen receptor positive (CMS-HCC) [C50.312, Z17.0] POSTOP  HPI:  Amy Woodard is a 77 y.o. female who is here for followup from above.  States pain is worse and bruising is worse as well from axillary site.     Current Medications: has a current medication list which includes the following prescription(s): azelastine, budesonide-formoterol, folic acid, glipizide, hydroxychloroquine, ipratropium-albuterol, levothyroxine, and tizanidine.  Allergies:      Allergies  Allergen Reactions  . Sulfa (Sulfonamide Antibiotics) Itching and Rash  . Sulfasalazine Itching    ROS: A 15 point review of systems was performed and pertinent positives and negatives noted in HPI   Objective:   BP 122/53 (BP Location: Left upper arm, Patient Position: Sitting, BP Cuff Size: Adult)   Pulse 89   Temp 36.4 C (97.6 F) (Oral)   Ht 175.3 cm ('5\' 9"'$ )   Wt 75.3 kg (166 lb)   BMI 24.51 kg/m   Constitutional :  alert, appears stated age, cooperative and no distress  Musculoskeletal: Steady gait and movement  Skin: Cool and moist, incision clean, dry, intact, but extensive bruising noted on her entire left flank, down left upper arm, and her back, not crossing midline. Large hematoma tender to palpation noted deep to incision site in left axilla.  Breast incisions bilaterally look well.  No erythema, induration or drainage to indicate infection.    Psychiatric: Normal affect, non-agitated, not confused       LABS:  Surgical Pathology CASE: [id number] PATIENT: Amy Woodard Surgical Pathology Report     SPECIMEN SUBMITTED: A. Breast, right; deep margins B. Breast mass, left C. Breast, left, anterior medial margin D. Breast, left; deep lateral margins E. Breast, left; posterior margins F. Sentinel lymph node 1 G. Sentinel lymph node 2  CLINICAL HISTORY: None provided  PRE-OPERATIVE  DIAGNOSIS: Left breast cancer (ductal), right breast cancer (lobular)  POST-OPERATIVE DIAGNOSIS: Same as pre-op     DIAGNOSIS: A. BREAST DEEP MARGIN, RIGHT; LUMPECTOMY: - NEGATIVE FOR RESIDUAL MALIGNANCY. - BIOPSY SITE CHANGE ALONG ANTERIOR ASPECT OF SPECIMEN.  B.BREAST MASS, LEFT NEEDLE LOCALIZED LUMPECTOMY: - BIOPSY SITE CHANGE AT ANTERIOR MARGIN AT MEDIAL / INFERIOR ASPECT OF THE SPECIMEN. - NEGATIVE FOR RESIDUAL MALIGNANCY.  C.BREAST ANTERIOR MEDIAL MARGIN, LEFT; EXCISION: - BIOPSY SITE CHANGE ALONG DEEP, LATERAL, AND MEDIAL ASPECTS OF THE SPECIMEN. - NEGATIVE FOR RESIDUAL MALIGNANCY.  D.BREAST DEEP LATERAL MARGIN, LEFT; EXCISION: - BIOPSY SITE CHANGE ALONG ANTERIOR ASPECT OF SPECIMEN. - NEGATIVE FOR RESIDUAL MALIGNANCY. - SKELETAL MUSCLE PRESENT AT DEEP ASPECT.  E.BREAST POSTERIOR MARGIN, LEFT; EXCISION: - FIBROADIPOSE TISSUE WITH CAUTERY ARTIFACT. - NEGATIVE FOR MALIGNANCY.  F.SENTINEL LYMPH NODE, #1 LEFT; EXCISION: - ONE LYMPH NODE NEGATIVE FOR MALIGNANCY (0/1).  G.SENTINEL LYMPH NODE, #2 LEFT; EXCISION: - ONE LYMPH NODE NEGATIVE FOR MALIGNANCY (0/1).  Comment: The outside slides on this patient's left breast biopsy ([id number], performed 06/01/2018) were requested and reviewed in conjunction with the sign-out of this case. High-grade ductal carcinoma in situ is present with focal areas suspicious for invasion. Definite invasive carcinoma is not identified. DCIS is present in both outside blocks/slides of the left breast biopsy and measures 7 mm and 6 mm respectively. The DCIS is ER positive and PR positive. It was noted in the outside report that there were insufficient tumor cells for HER2 analysis. Based on these findings AJCC Staging of the left breast is pTis pN0 (sn). Given the presence of biopsy site change in  several of the current left re-excision specimens without residual tumor it is possible that there was complete/near complete  removal of the left breast lesion by the MRI biopsy. Follow-up MRI after an appropriate interval is advised to ensure the lesion has been completely removed. These findings were discussed with Dr. Tasia Catchings on 07/07/2018.  GROSS DESCRIPTION:   A.Intraoperative Consultation: Labeled: Right breast deep margin Received: Fresh Specimen: Lumpectomy Pathologic evaluation performed: Immediate gross evaluation of margins Diagnosis: IOC: No obvious mass Communicated to: Dr. Lysle Pearl at 12:03 PM on 07/03/2018, Quay Burow MD Tissue submitted: Not applicable  B Intraoperative Consultation: Labeled: Left breast mass Received: Fresh Specimen: Left breast mass Pathologic evaluation performed: Immediate gross evaluation of margins Diagnosis: Biopsy site change at anterior margin along medial aspect Communicated to: Dr. Lysle Pearl at 2:11 PM on 07/03/2018, Quay Burow MD Tissue submitted: Not applicable  C. Intraoperative Consultation: Labeled: Left breast anterior medial margin Received: Fresh Specimen: Lumpectomy Pathologic evaluation performed: Immediate gross evaluation of margins Diagnosis: IOC: No gross lesion identified Communicated to: Dr. Lysle Pearl at 2:42 PM on 07/03/2018, Quay Burow MD Tissue submitted: Not applicable  D. Intraoperative Consultation: Labeled: Left breast deep lateral margins Received: Fresh Specimen: Lumpectomy Pathologic evaluation performed: Immediate gross evaluation of margins Diagnosis: IOC: No gross lesion identified Communicated to: Dr. Lysle Pearl at 3:02 PM on 07/03/2018, Quay Burow MD Tissue submitted: Not applicable  E. Intraoperative Consultation: Labeled: Left breast posterior margin Received: Fresh Specimen: Lumpectomy Pathologic evaluation performed: Immediate gross evaluation of margins Diagnosis: No gross lesion  identified Communicated to: Dr. Lysle Pearl at 3:02 PM on 07/03/2018, Quay Burow MD Tissue submitted: Not applicable  A. Labeled: Right breast deep margin Received: Fresh Accompanying specimen radiograph: No Radiographic findings: Not applicable Time in fixative: 12:05 PM on 07/03/2018  Cold ischemic time: 15 minutes Total fixation time: 15 hours Type of procedure: Lumpectomy Location / laterality of specimen: Right Orientation of specimen: Short superior, long lateral, double deep Inking: Superior = blue Inferior = green Medial = yellow Lateral = orange Posterior = black Anterior/Superficial = red Size of specimen: 5.6 (medial-lateral) by 4.1 (superior-inferior) by 1.5 (anterior-posterior) centimeter Skin: Not present Biopsy site: Anterior surface smooth and appears to be old biopsy cavity  Number of discrete masses: No mass grossly identified Description of remainder of tissue: Yellow lobulated fibrofatty  Block summary: 1-3 - perpendicular entire lateral aspect with deep, respectively (inferior towards superior 4-9 remaining lateral half of the specimen (lateral towards medial consecutively) (4-5, 6-7, 8-9 is one section bisected) 10 - representative perpendicular medial  B. Labeled: Left breast mass Received: Fresh Accompanying specimen radiograph: Yes Radiographic findings: one wire entering anterior inferior aspect Time in fixative: 2:12 PM on 07/03/2018  Cold ischemic time: 23 minutes Total fixation time: 9 hours Type of procedure: Lumpectomy Location / laterality of specimen: Left Orientation of specimen: Long lateral, short superior, double deep Inking: Superior = blue Inferior = green Medial = yellow Lateral = orange Posterior = black Anterior/Superficial = red Size of specimen: 4.8 (superior-inferior) by 4.1 (anterior-posterior) by 1.1 (medial-lateral) centimeter Skin: Not present Biopsy site: one firm and suggestive of biopsy site  Number of  discrete masses: one firm area Size of mass(es)/firm aspect: 0.9 x 0.9 x 0.6 cm Description of mass(es): Slightly firm with a small amount of nodularity and hemorrhage Distance between masses/clips: Not applicable, no clip identified Margins: Abutting anterior, medial, 0.1 cm from inferior, 1.5 cm from superior, 1.1 cm from deep and 0.8 cm from lateral Description of remainder of tissue: Yellow  lobulated fibrofatty  Block summary: 1-6 - entire firm mass area from inferior towards superior consecutively 7 - perpendicular superior  C. Labeled: Left breast anterior medial margin Received: Fresh Accompanying specimen radiograph: No Radiographic findings: Not applicable Time in fixative: 2:42 PM on 07/03/2018  Cold ischemic time: 14 minutes Total fixation time: 8.25 Type of procedure: Lumpectomy Location / laterality of specimen: Left Orientation of specimen: Long lateral, short superior, double deep Inking: Superior = blue Inferior = green Medial = yellow Lateral = orange Posterior = black Anterior/Superficial = red Size of specimen: 4.5 (medial-lateral) by 2.8 (superior-inferior) by 1.0 (anterior-posterior) centimeter Skin: Not present Biopsy site: Not grossly identified Number of discrete masses: 0 Description of remainder of tissue: Yellow lobulated with focal hemorrhage  Block summary: 1-3 - representative sections  D. Labeled: Left breast deep lateral margin Received: Fresh Accompanying specimen radiograph: Yes Radiographic findings: Nothing noted Time in fixative: 3:02 PM on 07/03/2018  Cold ischemic time: 27 minutes Total fixation time: 8 hours Type of procedure: Lumpectomy Location / laterality of specimen: Left Orientation of specimen: Long lateral, short superior, double deep Inking: Superior = blue Inferior = green Medial = yellow Lateral = orange Posterior = black Anterior/Superficial = red Size of specimen: 3.5 (medial-lateral) by 3.2  (superior-inferior) by 0.9 (anterior-posterior) centimeter Skin: Not present Biopsy site: None identified Number of discrete masses: 0 Description of remainder of tissue: Yellow lobulated fibrofatty tissue  Block summary: 1-3 - representative tissue  E. Labeled: Left breast posterior margin Received: Fresh Accompanying specimen radiograph: Yes Radiographic findings: Nothing noted Time in fixative: 3:02 PM on 07/03/2018  Cold ischemic time: 23 minutes Total fixation time: 8 hours Type of procedure: Lumpectomy Location / laterality of specimen: Left Orientation of specimen: Confirmed with OR nurse at 2:54 PM long lateral, short superior, double deep Inking: Superior = blue Inferior = green Medial = yellow Lateral = orange Posterior = black Anterior/Superficial = red Size of specimen: 4.0 (superior-inferior) by 3.0 (medial-lateral) by 0.5 (anterior-posterior) centimeter Skin: Not present Biopsy site: Not identified Number of discrete masses: 0 Description of remainder of tissue: Yellow lobulated fibrofatty  Block summary: 1-3 - representative sections  F. Labeled: Sentinel lymph node #1 Received: In formalin Tissue fragment(s): 1 Size: 3.2 x 2.2 x 1.0 cm Description: Fragment of yellow lobulated fibrofatty tissue with a 1.5 x 1.0 x 0.5 cm predominantly fatty replaced lymph node embedded in fatty tissue, bisected Entirely submitted in one cassette   G. Labeled: Sentinel lymph node #2 Received: In formalin Tissue fragment(s): 1 Size: 3.2 x 1.5 x 0.5 cm Description: Fragment of yellow lobulated fibrofatty tissue with a 0.8 x 0.5 x 0.2 cm firm fragment suggestive of node Entire suggestive of node submitted in one cassette    Final Diagnosis performed by Quay Burow, MD. Electronically signed 07/07/2018 3:41:25PM The electronic signature indicates that the named Attending Pathologist has evaluated the specimen  Technical component performed at [facility], [address] [place], [place] [address] Lab: [phone] Dir: Rush Farmer, MD, MMMProfessional component performed at [facility], [facility], [address], [place], [place] [address] Lab: [phone] Dir: Dellia Nims. Rubinas, MD   RADS: N/A  Assessment:      Malignant neoplasm of lower-inner quadrant of left breast in female, estrogen receptor positive (CMS-HCC) [C50.312, Z17.0]  Plan:   1. Large hematoma.  Will return to OR for evacuation due to symptomatology and persistently decreasing hgb count.  Discussed final path report in detail with patient and niece and they verbalized  understanding.  Will need to re-engage with rad onc and heme once regarding adjuvant radiation +/- hormonal therapy.  R/B/A to surgery discussed.  Risks include bleeding infection continued pain postop delayed wound healing and additional procedures to address such issues.  If it includes symptomatic relief.  Alternatives include continued watching but likely will fail at this point.  Patient verbalizes understanding and agrees to proceed with surgery    Electronically signed by Benjamine Sprague, DO on 07/10/2018 12:28 PM

## 2018-07-11 LAB — CBC
HCT: 23.9 % — ABNORMAL LOW (ref 36.0–46.0)
Hemoglobin: 7.1 g/dL — ABNORMAL LOW (ref 12.0–15.0)
MCH: 28 pg (ref 26.0–34.0)
MCHC: 29.7 g/dL — AB (ref 30.0–36.0)
MCV: 94.1 fL (ref 80.0–100.0)
PLATELETS: 376 10*3/uL (ref 150–400)
RBC: 2.54 MIL/uL — ABNORMAL LOW (ref 3.87–5.11)
RDW: 15.3 % (ref 11.5–15.5)
WBC: 8.4 10*3/uL (ref 4.0–10.5)
nRBC: 0 % (ref 0.0–0.2)

## 2018-07-11 LAB — BASIC METABOLIC PANEL
Anion gap: 7 (ref 5–15)
BUN: 11 mg/dL (ref 8–23)
CALCIUM: 8.4 mg/dL — AB (ref 8.9–10.3)
CO2: 35 mmol/L — ABNORMAL HIGH (ref 22–32)
CREATININE: 0.55 mg/dL (ref 0.44–1.00)
Chloride: 102 mmol/L (ref 98–111)
GFR calc Af Amer: >60 mL/min (ref >60)
GLUCOSE: 96 mg/dL (ref 70–99)
POTASSIUM: 4 mmol/L (ref 3.5–5.1)
Sodium: 144 mmol/L (ref 135–145)

## 2018-07-11 LAB — HEMOGLOBIN AND HEMATOCRIT, BLOOD
HCT: 28.8 % — ABNORMAL LOW (ref 36.0–46.0)
HEMOGLOBIN: 9.1 g/dL — AB (ref 12.0–15.0)

## 2018-07-11 LAB — PREPARE RBC (CROSSMATCH)

## 2018-07-11 LAB — GLUCOSE, CAPILLARY
Glucose-Capillary: 96 mg/dL (ref 70–99)
Glucose-Capillary: 158 mg/dL — ABNORMAL HIGH (ref 70–99)
Glucose-Capillary: 141 mg/dL — ABNORMAL HIGH (ref 70–99)
Glucose-Capillary: 137 mg/dL — ABNORMAL HIGH (ref 70–99)

## 2018-07-11 LAB — ABO/RH: ABO/RH(D): O NEG

## 2018-07-11 MED ORDER — SODIUM CHLORIDE 0.9% IV SOLUTION
Freq: Once | INTRAVENOUS | Status: AC
  Administered 2018-07-11: 14:00:00 via INTRAVENOUS

## 2018-07-11 NOTE — Anesthesia Postprocedure Evaluation (Signed)
Anesthesia Post Note  Patient: CELA NEWCOM  Procedure(s) Performed: IRRIGATION AND DEBRIDEMENT HEMATOMA LEFT AXILLARY (Left )  Patient location during evaluation: PACU Anesthesia Type: General Level of consciousness: awake and alert Pain management: pain level controlled Vital Signs Assessment: post-procedure vital signs reviewed and stable Respiratory status: spontaneous breathing, nonlabored ventilation, respiratory function stable and patient connected to nasal cannula oxygen Cardiovascular status: blood pressure returned to baseline and stable Postop Assessment: no apparent nausea or vomiting Anesthetic complications: no     Last Vitals:  Vitals:   07/10/18 2212 07/10/18 2346  BP: (!) 118/39 (!) 117/49  Pulse: 82 93  Resp: 20 (!) 21  Temp: 36.9 C 36.8 C  SpO2: 98% 98%    Last Pain:  Vitals:   07/10/18 2346  TempSrc: Oral  PainSc:                  Gray Doering S

## 2018-07-11 NOTE — Progress Notes (Signed)
Subjective:  CC:  Amy Woodard is a 77 y.o. female  Hospital stay day 1, 1 Day Post-Op left chest wall hematoma evacuation  HPI: Feeling better, but still sore.    ROS:  A 5 point review of systems was performed and pertinent positives and negatives noted in HPI.   Objective:      Temp:  [97.6 F (36.4 C)-98.8 F (37.1 C)] 98.2 F (36.8 C) (11/02 1203) Pulse Rate:  [74-94] 84 (11/02 1203) Resp:  [6-24] 16 (11/02 1203) BP: (108-168)/(38-62) 126/41 (11/02 1203) SpO2:  [82 %-100 %] 98 % (11/02 1203) Weight:  [73.9 kg] 73.9 kg (11/01 1405)     Height: 5\' 9"  (175.3 cm) Weight: 73.9 kg BMI (Calculated): 24.06   Intake/Output this shift:  Total I/O In: 240 [P.O.:240] Out: -        Constitutional :  alert, cooperative, appears stated age and no distress  Respiratory:  clear to auscultation bilaterally  Cardiovascular:  regular rate and rhythm  Skin: Cool and moist. Incision staples intact, minimal discharge.  Slight swelling noted but soft, no expansion of bruising.  Psychiatric: Normal affect, non-agitated, not confused       LABS:  CMP Latest Ref Rng & Units 07/11/2018 07/03/2018 03/30/2018  Glucose 70 - 99 mg/dL 96 - 100(H)  BUN 8 - 23 mg/dL 11 - 15  Creatinine 0.44 - 1.00 mg/dL 0.55 0.64 0.56  Sodium 135 - 145 mmol/L 144 - 142  Potassium 3.5 - 5.1 mmol/L 4.0 - 4.2  Chloride 98 - 111 mmol/L 102 - 99  CO2 22 - 32 mmol/L 35(H) - 37(H)  Calcium 8.9 - 10.3 mg/dL 8.4(L) - 9.6  Total Protein 6.5 - 8.1 g/dL - - 7.8  Total Bilirubin 0.3 - 1.2 mg/dL - - 0.4  Alkaline Phos 38 - 126 U/L - - 51  AST 15 - 41 U/L - - 21  ALT 0 - 44 U/L - - 19   CBC Latest Ref Rng & Units 07/11/2018 07/05/2018 07/05/2018  WBC 4.0 - 10.5 K/uL 8.4 13.3(H) 12.5(H)  Hemoglobin 12.0 - 15.0 g/dL 7.1(L) 9.3(L) 9.4(L)  Hematocrit 36.0 - 46.0 % 23.9(L) 30.2(L) 30.9(L)  Platelets 150 - 400 K/uL 376 228 214    RADS: n/a Assessment:    1 Day Post-Op left chest wall hematoma evacuation from [facility].   Looking ok.  hgb down to 7.1 now, will proceed with transfusion and continue to monitor.  Serial wound checks.

## 2018-07-12 LAB — CBC
HEMATOCRIT: 29.7 % — AB (ref 36.0–46.0)
Hemoglobin: 9.3 g/dL — ABNORMAL LOW (ref 12.0–15.0)
MCH: 28.1 pg (ref 26.0–34.0)
MCHC: 31.3 g/dL (ref 30.0–36.0)
MCV: 89.7 fL (ref 80.0–100.0)
Platelets: 398 10*3/uL (ref 150–400)
RBC: 3.31 MIL/uL — ABNORMAL LOW (ref 3.87–5.11)
RDW: 16.6 % — AB (ref 11.5–15.5)
WBC: 10.4 10*3/uL (ref 4.0–10.5)
nRBC: 0 % (ref 0.0–0.2)

## 2018-07-12 LAB — BPAM RBC
BLOOD PRODUCT EXPIRATION DATE: 201911262359
ISSUE DATE / TIME: 201911021422
UNIT TYPE AND RH: 9500

## 2018-07-12 LAB — BASIC METABOLIC PANEL
Anion gap: 5 (ref 5–15)
BUN: 7 mg/dL — ABNORMAL LOW (ref 8–23)
CALCIUM: 8.7 mg/dL — AB (ref 8.9–10.3)
CO2: 36 mmol/L — AB (ref 22–32)
CREATININE: 0.46 mg/dL (ref 0.44–1.00)
Chloride: 98 mmol/L (ref 98–111)
GFR calc Af Amer: >60 mL/min (ref >60)
GFR calc non Af Amer: >60 mL/min (ref >60)
Glucose, Bld: 110 mg/dL — ABNORMAL HIGH (ref 70–99)
Potassium: 4 mmol/L (ref 3.5–5.1)
Sodium: 139 mmol/L (ref 135–145)

## 2018-07-12 LAB — GLUCOSE, CAPILLARY
GLUCOSE-CAPILLARY: 191 mg/dL — AB (ref 70–99)
Glucose-Capillary: 103 mg/dL — ABNORMAL HIGH (ref 70–99)
Glucose-Capillary: 82 mg/dL (ref 70–99)
Glucose-Capillary: 95 mg/dL (ref 70–99)

## 2018-07-12 LAB — TYPE AND SCREEN
ABO/RH(D): O NEG
Antibody Screen: NEGATIVE
Unit division: 0

## 2018-07-12 NOTE — Progress Notes (Signed)
Subjective:  CC:  Amy Woodard is a 77 y.o. female  Hospital stay day 2, 2 Days Post-Op left chest wall hematoma evacuation  HPI: Feeling better, but still sore again.  doenst think swelling is getting bigger.    ROS:  A 5 point review of systems was performed and pertinent positives and negatives noted in HPI.   Objective:      Temp:  [97.8 F (36.6 C)-98.1 F (36.7 C)] 98 F (36.7 C) (11/03 0630) Pulse Rate:  [78-92] 78 (11/03 0630) Resp:  [16-20] 20 (11/03 0630) BP: (117-136)/(47-79) 124/47 (11/03 0630) SpO2:  [95 %-100 %] 98 % (11/03 0630)     Height: 5\' 9"  (175.3 cm) Weight: 73.9 kg BMI (Calculated): 24.06   Intake/Output this shift:  Total I/O In: 240 [P.O.:240] Out: 0        Constitutional :  alert, cooperative, appears stated age and no distress  Respiratory:  clear to auscultation bilaterally  Cardiovascular:  regular rate and rhythm  Skin: Cool and moist. Incision staples intact, minimal discharge.  Slight swelling noted but soft, no expansion of bruising or size of swelling.  Psychiatric: Normal affect, non-agitated, not confused       LABS:  CMP Latest Ref Rng & Units 07/12/2018 07/11/2018 07/03/2018  Glucose 70 - 99 mg/dL 110(H) 96 -  BUN 8 - 23 mg/dL 7(L) 11 -  Creatinine 0.44 - 1.00 mg/dL 0.46 0.55 0.64  Sodium 135 - 145 mmol/L 139 144 -  Potassium 3.5 - 5.1 mmol/L 4.0 4.0 -  Chloride 98 - 111 mmol/L 98 102 -  CO2 22 - 32 mmol/L 36(H) 35(H) -  Calcium 8.9 - 10.3 mg/dL 8.7(L) 8.4(L) -  Total Protein 6.5 - 8.1 g/dL - - -  Total Bilirubin 0.3 - 1.2 mg/dL - - -  Alkaline Phos 38 - 126 U/L - - -  AST 15 - 41 U/L - - -  ALT 0 - 44 U/L - - -   CBC Latest Ref Rng & Units 07/12/2018 07/11/2018 07/11/2018  WBC 4.0 - 10.5 K/uL 10.4 - 8.4  Hemoglobin 12.0 - 15.0 g/dL 9.3(L) 9.1(L) 7.1(L)  Hematocrit 36.0 - 46.0 % 29.7(L) 28.8(L) 23.9(L)  Platelets 150 - 400 K/uL 398 - 376    RADS: n/a Assessment:    2 Day Post-Op left chest wall hematoma evacuation  from [facility].  Looking ok.  hgb down to 7.1 but up to 9.1 after only one unit pRBC, so 7.1 likely falsely low.  12hr recheck at 9.3, stable.  Will do one more repeat cbc tomorrow am, then ok to d/c.

## 2018-07-13 ENCOUNTER — Encounter: Payer: Self-pay | Admitting: Surgery

## 2018-07-13 LAB — CBC WITH DIFFERENTIAL/PLATELET
ABS IMMATURE GRANULOCYTES: 0.07 10*3/uL (ref 0.00–0.07)
BASOS PCT: 1 %
Basophils Absolute: 0.1 10*3/uL (ref 0.0–0.1)
Eosinophils Absolute: 0.8 10*3/uL — ABNORMAL HIGH (ref 0.0–0.5)
Eosinophils Relative: 7 %
HEMATOCRIT: 33.5 % — AB (ref 36.0–46.0)
HEMOGLOBIN: 10.4 g/dL — AB (ref 12.0–15.0)
IMMATURE GRANULOCYTES: 1 %
LYMPHS ABS: 1.6 10*3/uL (ref 0.7–4.0)
LYMPHS PCT: 15 %
MCH: 28.2 pg (ref 26.0–34.0)
MCHC: 31 g/dL (ref 30.0–36.0)
MCV: 90.8 fL (ref 80.0–100.0)
MONO ABS: 0.7 10*3/uL (ref 0.1–1.0)
MONOS PCT: 6 %
NEUTROS ABS: 7.4 10*3/uL (ref 1.7–7.7)
NEUTROS PCT: 70 %
PLATELETS: 425 10*3/uL — AB (ref 150–400)
RBC: 3.69 MIL/uL — ABNORMAL LOW (ref 3.87–5.11)
RDW: 15.8 % — ABNORMAL HIGH (ref 11.5–15.5)
WBC: 10.5 10*3/uL (ref 4.0–10.5)
nRBC: 0 % (ref 0.0–0.2)

## 2018-07-13 LAB — GLUCOSE, CAPILLARY
Glucose-Capillary: 114 mg/dL — ABNORMAL HIGH (ref 70–99)
Glucose-Capillary: 87 mg/dL (ref 70–99)

## 2018-07-13 NOTE — Progress Notes (Signed)
Discharge instructions reviewed with patient and cousin, including instructions for changing dressing.  Supplies provided as well.  Understanding was verbalized and all questions were answered.  Patient discharged home via wheelchair in stable condition escorted by volunteer staff.

## 2018-07-13 NOTE — Evaluation (Signed)
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  Clinical Impression  Upon evaluation, patient alert and oriented; follows all commands and demonstrates good effort with all activities during session.  L UE elevation limited approximately shoulder height due to post-op pain/soreness; however, tolerates ROM activities without significant difficulties (limited to pain-free range, approx shoulder height).  Able to complete bed mobility, sit/stand, basic transfers and gait (200') with RW, mod indep.  Feels RW is helpful for longer distances and wishes to continue use for optimal safety/stability at this time. Sats >92% on 2L throughout session; reports 2L O2 at baseline. Would benefit from skilled PT to address above deficits and promote optimal return to PLOF; recommend transition to home with continued outpatient PT follow up to address L shoulder strength/ROM during extended recovery phases.    Follow Up Recommendations Outpatient PT(for L shoulder management)    Equipment Recommendations  Rolling walker with 5" wheels    Recommendations for Other Services       Precautions / Restrictions Precautions Precautions: Fall Restrictions Weight Bearing Restrictions: No      Mobility  Bed Mobility Overal bed mobility: Modified Independent                Transfers Overall transfer level: Modified independent Equipment used: Rolling walker (2 wheeled) Transfers: Sit to/from Stand              Ambulation/Gait Ambulation/Gait assistance: Supervision;Modified independent (Device/Increase time) Gait Distance (Feet): 200 Feet Assistive device: Rolling walker (2 wheeled)       General Gait Details: mild/mod reliance  on RW, patient preferring continued use at this time (denies increased pain with L LE WBing)  Stairs            Wheelchair Mobility    Modified Rankin (Stroke Patients Only)       Balance Overall balance assessment: Needs assistance Sitting-balance support: No upper extremity supported;Feet supported Sitting balance-Leahy Scale: Good     Standing balance support: Bilateral upper extremity supported Standing balance-Leahy Scale: Good                               Pertinent Vitals/Pain Pain Assessment: Faces Faces Pain Scale: Hurts little more Pain Location: L flank, axilla Pain Descriptors / Indicators: Aching;Grimacing;Guarding Pain Intervention(s): Monitored during session;Limited activity within patient's tolerance;Repositioned    Home Living Family/patient expects to be discharged to:: Private residence Living Arrangements: Spouse/significant other Available Help at Discharge: Family Type of Home: House Home Access: Stairs to enter Entrance Stairs-Rails: Right Entrance Stairs-Number of Steps: 2 Home Layout: One level Home Equipment: None      Prior Function Level of Independence: Independent         Comments: Indep with ADLs, household and community mobilization     Hand Dominance   Dominant Hand: Right    Extremity/Trunk Assessment   Upper Extremity Assessment Upper Extremity Assessment: Overall WFL for tasks assessed(L shoulder elevation limited to grossly shoulder height due to pain)    Lower Extremity Assessment Lower Extremity Assessment: Overall WFL for tasks assessed       Communication   Communication: No difficulties  Cognition Arousal/Alertness: Awake/alert Behavior During Therapy: WFL for tasks assessed/performed Overall Cognitive Status: Within Functional Limits  for tasks assessed                                        General Comments      Exercises Other Exercises Other Exercises: Educated  in active, act assist (self-ROM, finger-walking up wall) therex to promote L shoulder ROM in flexion, abduct, scaption and extension.  Advised elevation within pain-free/pain-tolerable range, limited to approximately shoulder height at this time. No restrictions noted to L IR/ER, elbow, wrist or hand.  Patient voiced understanding, returned appropriate demonstration (1x10). Other Exercises: Toilet transfer, mod indep, without assist device; good LE strength/stability, intermittent reaching for furniture for stabilization.  Reinforced use of RW; patient voiced understanding.   Assessment/Plan    PT Assessment Patient needs continued PT services  PT Problem List Decreased strength;Decreased range of motion;Decreased balance;Decreased mobility;Decreased activity tolerance;Decreased skin integrity;Pain       PT Treatment Interventions DME instruction;Gait training;Stair training;Functional mobility training;Therapeutic activities;Patient/family education;Balance training;Therapeutic exercise    PT Goals (Current goals can be found in the Care Plan section)  Acute Rehab PT Goals Patient Stated Goal: to return home PT Goal Formulation: With patient Time For Goal Achievement: 07/27/18 Potential to Achieve Goals: Good    Frequency Min 2X/week   Barriers to discharge        Co-evaluation               AM-PAC PT "6 Clicks" Daily Activity  Outcome Measure Difficulty turning over in bed (including adjusting bedclothes, sheets and blankets)?: None Difficulty moving from lying on back to sitting on the side of the bed? : None Difficulty sitting down on and standing up from a chair with arms (e.g., wheelchair, bedside commode, etc,.)?: None Help needed moving to and from a bed to chair (including a wheelchair)?: None Help needed walking in hospital room?: None Help needed climbing 3-5 steps with a railing? : None 6 Click Score: 24    End of Session Equipment Utilized During Treatment:  Gait belt Activity Tolerance: Patient tolerated treatment well Patient left: in chair;with call bell/phone within reach(chair alarm not required per RN) Nurse Communication: Mobility status PT Visit Diagnosis: Pain Pain - Right/Left: Left Pain - part of body: Shoulder    Time: 1003-1040 PT Time Calculation (min) (ACUTE ONLY): 37 min   Charges:   PT Evaluation $PT Eval Moderate Complexity: 1 Mod PT Treatments $Therapeutic Exercise: 8-22 mins $Therapeutic Activity: 8-22 mins       Jaszmine Navejas H. Owens Shark, PT, DPT, NCS 07/13/18, 2:54 PM [phone]

## 2018-07-13 NOTE — Care Management Important Message (Signed)
Copy of signed IM left with patient in room.  

## 2018-07-13 NOTE — Care Management Note (Signed)
Case Management Note  Patient Details  Name: Amy Woodard MRN: [id number] Date of Birth: 25-Jan-1941   Patient discharged home today.  Prior to discharge notified by PT that it is recommended to have RW at home. RW delivered to room prior to discharge by [facility] from [place] [facility]. PT recommends outpatient PT.  Patient to follow up with her PCP.    Subjective/Objective:                    Action/Plan:   Expected Discharge Date:  07/13/18               Expected Discharge Plan:  Home/Self Care  In-House Referral:     Discharge planning Services  CM Consult  Post Acute Care Choice:  Durable Medical Equipment Choice offered to:     DME Arranged:  Walker rolling DME Agency:  [place]:    [facility] Agency:     Status of Service:  Completed, signed off  If discussed at [place] of Stay Meetings, dates discussed:    Additional Comments:  Beverly Sessions, RN 07/13/2018, 1:57 PM

## 2018-07-13 NOTE — Discharge Instructions (Signed)
Keep area covered and change dressing as needed.  The wound will likely continue to drain.    Followup in office two days for wound check.  Continue pain meds as previously prescribed.  Ok to take shower and continue activities as tolerated

## 2018-07-14 NOTE — Discharge Summary (Signed)
Physician Discharge Summary  Patient ID: Amy Woodard MRN: [id number] DOB/AGE: Jun 25, 1941 77 y.o.  Admit date: 07/10/2018 Discharge date: 07/14/2018  Admission Diagnoses: left axillary hematoma  Discharge Diagnoses:  Same as above  Discharged Condition: good  Hospital Course: Patient admitted for evacuation of postoperative left axillary sentinel lymph node biopsy site hematoma.  Surgery was uneventful with removal of clots and ligation of a oozing vessel.  See op note for further details.  Recovery wise she did well with a new increased swelling in the area likely a seroma due to the persistent stable hemoglobin.  Day of discharge it was noted there is some leakage from the staple line so one staple was removed where there was copious amounts of pus serosanguineous fluid noted to the skin.  The swelling in the area resolved at that point.  Patient was deemed appropriate for discharge at that time.  Consults: PT for ROM exercises  Discharge Exam: Blood pressure (!) 151/57, pulse 84, temperature 97.9 F (36.6 C), temperature source Oral, resp. rate 20, height 5\' 9"  (1.753 m), weight 73.9 kg, SpO2 97 %. As described above  Disposition:  Discharge disposition: 01-Home or Self Care       Discharge Instructions    Discharge patient   Complete by:  As directed    After labs show stable hemoglobin   Discharge disposition:  01-Home or Self Care   Discharge patient date:  07/13/2018     Allergies as of 07/13/2018      Reactions   Sulfa Antibiotics Itching      Medication List    TAKE these medications   acetaminophen 650 MG CR tablet Commonly known as:  TYLENOL Take 1 tablet (650 mg total) by mouth every 8 (eight) hours as needed for pain.   azelastine 0.1 % nasal spray Commonly known as:  ASTELIN Place 1 spray into both nostrils 2 (two) times daily. Use in each nostril as directed   celecoxib 200 MG capsule Commonly known as:  CELEBREX Take 1 capsule (200 mg total) by  mouth daily as needed for mild pain or moderate pain.   folic acid 1 MG tablet Commonly known as:  FOLVITE Take 1 mg by mouth daily.   glipiZIDE 5 MG tablet Commonly known as:  GLUCOTROL Take 5 mg by mouth daily before breakfast.   hydroxychloroquine 200 MG tablet Commonly known as:  PLAQUENIL Take 400 mg by mouth daily.   Ipratropium-Albuterol 20-100 MCG/ACT Aers respimat Commonly known as:  COMBIVENT Inhale 1 puff into the lungs every 6 (six) hours as needed for wheezing or shortness of breath.   levothyroxine 50 MCG tablet Commonly known as:  SYNTHROID, LEVOTHROID Take 50 mcg by mouth daily before breakfast.   oxyCODONE-acetaminophen 5-325 MG tablet Commonly known as:  PERCOCET/ROXICET Take 1 tablet by mouth every 4 (four) hours as needed for severe pain.   SYMBICORT 160-4.5 MCG/ACT inhaler Generic drug:  budesonide-formoterol Inhale 2 puffs into the lungs 2 (two) times daily.     ASK your doctor about these medications   docusate sodium 100 MG capsule Commonly known as:  COLACE Take 1 capsule (100 mg total) by mouth 2 (two) times daily as needed for up to 10 days for mild constipation. Ask about: Should I take this medication?      Follow-up Information    Rush[place], Haakon Titsworth, DO. Go on 07/15/2018.   Specialty:  Surgery Why:  @8 :15am Contact information: [address] [place] [address] [phone]  Total time spent arranging discharge was >80min. Signed: Benjamine Sprague 07/14/2018, 10:36 AM

## 2018-07-16 DIAGNOSIS — J449 Chronic obstructive pulmonary disease, unspecified: Secondary | ICD-10-CM | POA: Diagnosis not present

## 2018-07-22 DIAGNOSIS — S20212D Contusion of left front wall of thorax, subsequent encounter: Secondary | ICD-10-CM | POA: Diagnosis not present

## 2018-07-23 DIAGNOSIS — M0579 Rheumatoid arthritis with rheumatoid factor of multiple sites without organ or systems involvement: Secondary | ICD-10-CM | POA: Diagnosis not present

## 2018-07-23 DIAGNOSIS — J449 Chronic obstructive pulmonary disease, unspecified: Secondary | ICD-10-CM | POA: Diagnosis not present

## 2018-07-23 DIAGNOSIS — C50512 Malignant neoplasm of lower-outer quadrant of left female breast: Secondary | ICD-10-CM | POA: Diagnosis not present

## 2018-07-29 ENCOUNTER — Other Ambulatory Visit: Payer: Self-pay

## 2018-07-29 ENCOUNTER — Encounter: Payer: Self-pay | Admitting: Oncology

## 2018-07-29 ENCOUNTER — Inpatient Hospital Stay: Payer: PPO | Attending: Oncology | Admitting: Oncology

## 2018-07-29 VITALS — BP 140/63 | HR 80 | Temp 97.4°F | Resp 18 | Wt 158.7 lb

## 2018-07-29 DIAGNOSIS — D0512 Intraductal carcinoma in situ of left breast: Secondary | ICD-10-CM

## 2018-07-29 DIAGNOSIS — D649 Anemia, unspecified: Secondary | ICD-10-CM | POA: Diagnosis not present

## 2018-07-29 DIAGNOSIS — C50312 Malignant neoplasm of lower-inner quadrant of left female breast: Secondary | ICD-10-CM | POA: Diagnosis not present

## 2018-07-29 DIAGNOSIS — Z170 Estrogen receptor positive status [ER+]: Secondary | ICD-10-CM | POA: Diagnosis not present

## 2018-07-29 DIAGNOSIS — D473 Essential (hemorrhagic) thrombocythemia: Secondary | ICD-10-CM | POA: Insufficient documentation

## 2018-07-29 DIAGNOSIS — N6489 Other specified disorders of breast: Secondary | ICD-10-CM

## 2018-07-29 DIAGNOSIS — D75839 Thrombocytosis, unspecified: Secondary | ICD-10-CM

## 2018-07-29 DIAGNOSIS — C50919 Malignant neoplasm of unspecified site of unspecified female breast: Secondary | ICD-10-CM

## 2018-07-29 NOTE — Progress Notes (Signed)
Patient here for follow up. Patient concerned about "soft spot" above incision area under left axilla.

## 2018-07-29 NOTE — Progress Notes (Signed)
Hematology/Oncology follow up  note [facility] [phone] [phone]   Patient Care Team: Baxter Hire, MD as PCP - General (Internal Medicine) Breast Surgeon Dr.Sakai.   CHIEF COMPLAINTS/REASON FOR VISIT:  Evaluation of breast cancer  HISTORY OF PRESENTING ILLNESS:  Amy Woodard is a  77 y.o.  female with PMH listed below who was referred to me for evaluation of newly diagnosed breast cancer. Patient had mammogram and ultrasound on 03/19/2018 which showed right breast 12:00 1.6 x 1.3 x 1.5 cm mass, no radiographically enlarged adenopathy.  Patient also report feeling a mass for the past couple of months. Biopsy pathology showed: Invasive mammary carcinoma, no special type, grade 3, DCIS present, lymphovascular invasion not identified, ER PR 90% positive, HER-2 negative  Nipple discharge: Denies Family history: Mother had melanoma, passed away at age of 49, sister had cervical cancer.  Sister with colon cancer, maternal grandmother sister OCP use: denies.  Estrogen and progesterone therapy: denies History of radiation to chest: denies.  Previous breast surgery: Denies  Patient has a history of chronic respiratory failure/ COPD, on home oxygen.  INTERVAL HISTORY Amy Woodard is a 77 y.o. female who has above history reviewed by me today presents for follow up visit for management of breast cancer. She has a complicated course of rest cancer diagnosis and surgery.  04/16/2018 status post right breast mass lumpectomy and sentinel lymph node biopsy.  Pathology showed invasive mammary carcinoma 1.7 cm, DCIS, high-grade with extensive involvement of lobules.  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  Sentinel lymph node biopsy negative. pT1c pN0. ER >90%, PR >90%, HER 2 negative.   05/14/2018 patient underwent MRI breast bilaterally to determined extent of positive deep margin/lobular type breast  cancer. MRI breast showed an irregular 1.4 cm mass in the lower inner quadrant of the left breast. Abnormal non-mass enhancement in the upper outer right breast, axillary tail region could be secondary to postsurgical changes, malignancy cannot be excluded.  06/05/2018 left breast mass biopsy showed invasive ductal carcinoma, grade 3, [facility] ER 100%, PR 50%, not enough tissue for HER 2 analysis.   07/03/2018 patient underwent right breast deep margin lumpectomy/reexcision: Pathology showed negative for residual malignancy. Left breast needle localized lumpectomy showed negative for residual malignancy.  Sentinel lymph node negative  In addition the outer slides on this patient's left breast biopsy were requested and reviewed by Dr. RReuel Derby  Dr. RReuel Derbyfeels the patient's biopsy showed high-grade DCIS with focal areas suspicious for invasion.  Definitive invasive carcinoma is not identified.  DCIS is present in both outside blocks and slides from biopsy and measures 7 mm and 6 mm respectively.  pTis pN0  Patient case have been discussed on breast tumor board multiple times with reviewing of images and pathology findings. Left breast MRI did show a 1.4 cm irregularity, and pathology only 7 mm and a 6 mm DCIS were found at the initial left breast biopsy.  Final left breast lumpectomy did not contain clips which was placed with MRI guidance at biopsy..Amy Woodard #Patient also developed postoperation hematoma and had acute on chronic anemia due to hematoma.  She was admitted for evacuation of postoperative left axillary sentinel lymph node biopsy site hematoma.  Status post PRBC transfusion during her admission.  Today patient was accompanied by her family member Reports still feeling weak, recovering from recent surgery and hematoma.  Reports some soreness under left axillary and also has bruising. Shortness of breath with exertion, worse than  her baseline.  She has chronic respiratory failure with oxygen dependence. She follows up with Dr. Raul Del   Review of Systems  Constitutional: Positive for malaise/fatigue. Negative for chills, fever and weight loss.  HENT: Negative for nosebleeds and sore throat.   Eyes: Negative for double vision, photophobia and redness.  Respiratory: Positive for shortness of breath. Negative for cough and wheezing.   Cardiovascular: Negative for chest pain, palpitations and orthopnea.  Gastrointestinal: Negative for abdominal pain, blood in stool, nausea and vomiting.  Genitourinary: Negative for dysuria.  Musculoskeletal: Negative for myalgias.       Left axillary soreness  Skin: Negative for itching and rash.  Neurological: Negative for dizziness, tingling and tremors.  Endo/Heme/Allergies: Negative for environmental allergies. Does not bruise/bleed easily.  Psychiatric/Behavioral: Negative for depression.    MEDICAL HISTORY:  Past Medical History:  Diagnosis Date  . Allergy   . Arthritis    rheumatoid arthritis  . Breast cancer ([place]) 05/2018   Right breast found 1st, then left breast with ductal ca  . Cellulitis of breast 05/2018   right breast cellulitis after first biopsy,  antibiotics prescribed  . COPD (chronic obstructive pulmonary disease) ([place])   . Diabetes mellitus without complication ([place])   . Dyspnea   . GERD (gastroesophageal reflux disease)    occasionally  . History of kidney stones    has one but not a problem  . Hypothyroidism   . Oxygen dependent    2liter nasal prong continuously    SURGICAL HISTORY: Past Surgical History:  Procedure Laterality Date  . APPENDECTOMY    . BACK SURGERY     lumbar. herniated disc . no metal  . BREAST BIOPSY Right 03/19/2018   INVASIVE MAMMARY CARCINOMA, NO SPECIAL TYPE  . BREAST BIOPSY Left 06/01/2018   MRI bx, grade III invasive ductal carcinoma  . BREAST BIOPSY Right 06/01/2018   MRI bx of enhancing mass, path showed  FOREIGN BODY GRANULOMATOUS RESPONSE   . 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 BREAST LUMPECTOMY Right 07/03/2018   Procedure: RE-EXCISION OF RIGHT BREAST TISSUE MASS;  Surgeon: Benjamine Sprague, DO;  Location: [facility] ORS;  Service: General;  Laterality: Right;  . IRRIGATION AND DEBRIDEMENT HEMATOMA Left 07/10/2018   Procedure: IRRIGATION AND DEBRIDEMENT HEMATOMA LEFT AXILLARY;  Surgeon: Benjamine Sprague, DO;  Location: [facility] ORS;  Service: General;  Laterality: Left;  Amy Woodard MASTECTOMY    . PARTIAL MASTECTOMY WITH NEEDLE LOCALIZATION AND AXILLARY SENTINEL LYMPH NODE BX Right 04/16/2018   Procedure: PARTIAL MASTECTOMY WITH NEEDLE LOCALIZATION AND AXILLARY SENTINEL LYMPH NODE BX;  Surgeon: Benjamine Sprague, DO;  Location: [facility] ORS;  Service: General;  Laterality: Right;  . PARTIAL MASTECTOMY WITH NEEDLE LOCALIZATION AND AXILLARY SENTINEL LYMPH NODE BX Left 07/03/2018   Procedure: PARTIAL MASTECTOMY WITH NEEDLE LOCALIZATION AND SENTINEL LYMPH NODE BX;  Surgeon: Benjamine Sprague, DO;  Location: [facility] ORS;  Service: General;  Laterality: Left;    SOCIAL HISTORY: Social History   Socioeconomic History  . Marital status: Married    Spouse name: richard..disabled  . Number of children: Not on file  . Years of education: Not on file  . Highest education level: Not on file  Occupational History  . 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 Financial resource strain: Not hard at all  . Food insecurity:  Worry: Never true    Inability: Never true  . Transportation needs:    Medical: No    Non-medical: No  Tobacco Use  . Smoking status: Former Smoker    Types: Cigarettes    Last attempt to quit: 03/30/1993    Years since quitting: 25.3  . Smokeless tobacco: Never Used  Substance and Sexual Activity  . Alcohol use: No  . Drug use: Never  . Sexual activity: Not on file  Lifestyle  . Physical activity:    Days per week: 7 days    Minutes per session: 30 min    . Stress: Not at all  Relationships  . Social connections:    Talks on phone: More than three times a week    Gets together: More than three times a week    Attends religious service: 1 to 4 times per year    Active member of club or organization: No    Attends meetings of clubs or organizations: Never    Relationship status: Married  . Intimate partner violence:    Fear of current or ex partner: No    Emotionally abused: No    Physically abused: No    Forced sexual activity: No  Other Topics Concern  . Not on file  Social History Narrative  . Not on file    FAMILY HISTORY: Family History  Problem Relation Age of Onset  . Clotting disorder Mother   . Melanoma Mother        deceased 54  . Lung cancer Sister        deceased 8s; smoker  . Alcohol abuse Father        deceased 67  . Cervical cancer Other        neice    ALLERGIES:  is allergic to sulfa antibiotics.  MEDICATIONS:  Current Outpatient Medications  Medication Sig Dispense Refill  . acetaminophen (TYLENOL) 650 MG CR tablet Take 1 tablet (650 mg total) by mouth every 8 (eight) hours as needed for pain. 30 tablet 0  . azelastine (ASTELIN) 0.1 % nasal spray Place 1 spray into both nostrils 2 (two) times daily. Use in each nostril as directed    . budesonide-formoterol (SYMBICORT) 160-4.5 MCG/ACT inhaler Inhale 2 puffs into the lungs 2 (two) times daily.     . celecoxib (CELEBREX) 200 MG capsule Take 1 capsule (200 mg total) by mouth daily as needed for mild pain or moderate pain. 30 capsule 0  . folic acid (FOLVITE) 1 MG tablet Take 1 mg by mouth daily.    Amy Woodard glipiZIDE (GLUCOTROL) 5 MG tablet Take 5 mg by mouth daily before breakfast.    . hydroxychloroquine (PLAQUENIL) 200 MG tablet Take 400 mg by mouth daily.     . Ipratropium-Albuterol (COMBIVENT) 20-100 MCG/ACT AERS respimat Inhale 1 puff into the lungs every 6 (six) hours as needed for wheezing or shortness of breath.     . levothyroxine (SYNTHROID,  LEVOTHROID) 50 MCG tablet Take 50 mcg by mouth daily before breakfast.    . oxyCODONE-acetaminophen (PERCOCET) 5-325 MG tablet Take 1 tablet by mouth every 4 (four) hours as needed for severe pain. (Patient not taking: Reported on 07/29/2018) 10 tablet 0   No current facility-administered medications for this visit.      PHYSICAL EXAMINATION: ECOG PERFORMANCE STATUS: 1 - Symptomatic but completely ambulatory Vitals:   07/29/18 0840  BP: 140/63  Pulse: 80  Resp: 18  Temp: (!) 97.4 F (36.3 C)  SpO2: 96%  Filed Weights   07/29/18 0840  Weight: 158 lb 11.2 oz (72 kg)    Physical Exam  Constitutional: She is oriented to person, place, and time. No distress.  On home oxygen  HENT:  Head: Normocephalic and atraumatic.  Mouth/Throat: Oropharynx is clear and moist.  Eyes: Pupils are equal, round, and reactive to light. Conjunctivae and EOM are normal. No scleral icterus.  Neck: Normal range of motion. Neck supple.  Cardiovascular: Normal rate, regular rhythm and normal heart sounds.  Pulmonary/Chest: Effort normal. No respiratory distress. She has no wheezes.  Decreased breath sounds bilaterally  Abdominal: Soft. Bowel sounds are normal. She exhibits no distension and no mass.  Musculoskeletal: Normal range of motion. She exhibits no edema or deformity.  Lymphadenopathy:    She has no cervical adenopathy.  Neurological: She is alert and oriented to person, place, and time. No cranial nerve deficit. Coordination normal.  Skin: Skin is warm and dry. No rash noted. No erythema.  Psychiatric: She has a normal mood and affect. Her behavior is normal. Thought content normal.  Breast exam was performed in seated and lying down position. Patient is status post right breast lumpectomy with a well-healed surgical scar. No evidence of any palpable masses.  S/p left breat lumpectomy, with well healed scar. Left axillary bruising/skin discoloration, mild tenderness to palpation.      LABORATORY DATA:  I have reviewed the data as listed Lab Results  Component Value Date   WBC 10.5 07/13/2018   HGB 10.4 (L) 07/13/2018   HCT 33.5 (L) 07/13/2018   MCV 90.8 07/13/2018   PLT 425 (H) 07/13/2018   Recent Labs    03/30/18 1551 07/03/18 1857 07/11/18 0413 07/12/18 0613  NA 142  --  144 139  K 4.2  --  4.0 4.0  CL 99  --  102 98  CO2 37*  --  35* 36*  GLUCOSE 100*  --  96 110*  BUN 15  --  11 7*  CREATININE 0.56 0.64 0.55 0.46  CALCIUM 9.6  --  8.4* 8.7*  GFRNONAA >60 >60 >60 >60  GFRAA >60 >60 >60 >60  PROT 7.8  --   --   --   ALBUMIN 4.5  --   --   --   AST 21  --   --   --   ALT 19  --   --   --   ALKPHOS 51  --   --   --   BILITOT 0.4  --   --   --    Iron/TIBC/Ferritin/ %Sat No results found for: IRON, TIBC, FERRITIN, IRONPCTSAT      ASSESSMENT & PLAN:  1. Invasive carcinoma of breast ([place])   2. Estrogen receptor positive status (ER+)   3. Hematoma (nontraumatic) of breast   4. Ductal carcinoma in situ (DCIS) of left breast   5. Anemia, unspecified type   6. Thrombocytosis ([place])    #Independently reviewed patient's mammogram, ultrasound and MRI image findings Also reviewed patient's pathology reports.  Patient's case have been discussed breast cancer tumor board multiple times. Had a lengthy discussion with patient today and discussed about her image finding, pathology findings, standard treatment plan and potential side effects.   #Right breast pT1c pN0, reexcision did not reveal any residual disease.. Oncotype DX showed recurrence rate of 31, patient has 15% benefit from adjuvant chemotherapy.  I discussed the rationale, utility of Oncotype DX and potential side effects with patient. Patient is still recovering  from recent surgeries and she is hesitant about chemotherapy. Given that she has other comorbidities including chronic respiratory failure with oxygen dependence, if she decides to skip chemotherapy, I recommend patient to see RadOnc  and discuss about adjuvant radiation. Patient mentions that she may decline adjuvant RT as well but she is willing to have a discussion. Will refer to Dr.Chrystal.   # left breast: pathology was discussed on tumor board. Per discussion with pathology Dr.Rubinas, in her opinion,  left breast mass biopsy actually showed high grade DCIS, definitive invasive carcinoma is not identified.  Left lumpectomy did not show any residual disease, however clips were not found in the specimen.  Ideally a repeat mammogram should be done to see clips were left behind. However patient had left chest wall surgical bed hematoma and still very sore. Obtaining mammogram at this point is not feasible.  Per my discussion with surgeon Dr.Sakai, even clips are found on repeat image, re-excision is not feasible and the next step will be mastectomy.  Discussed with patient about current situation, will allow her to recover from hematoma and then proceed with another mammogram in short term. She is in agreement. She will also needs adjuvant radiation for left breast DCIS.   After she finishes RT, plan start patient on aromatase inhibitor. Patient also wants to discuss with her pulmonologist Dr.Fleming.  I have called Dr.Fleming's office and awaiting call back from him to discuss patient's estimated life expectancy from pulmonology aspect.  Patient will update me about her decision and will arrange her follow up appointment based on her decision.   # Anemia, due to hematoma, anticipate recovery in the future.  # Thrombocytosis likely reactive.   We spent sufficient time to discuss many aspect of care, questions were answered to patient's satisfaction. Orders Placed This Encounter  Procedures  . Ambulatory referral to Radiation Oncology    Standing Status:   Future    Standing Expiration Date:   07/30/2019    Referral Priority:   Routine    Referral Type:   Consultation    Referral Reason:   Specialty Services Required     Referred to Provider:   Noreene Filbert, MD    Requested Specialty:   Radiation Oncology    Number of Visits Requested:   1    All questions were answered. The patient knows to call the clinic with any problems questions or concerns.  Return of visit: To be determined.  Total face to face encounter time for this patient visit was 70 min. >50% of the time was  spent in counseling and coordination of care.       Earlie Server, MD, PhD Hematology Oncology [facility] Pager- [phone] 07/29/2018

## 2018-07-30 DIAGNOSIS — Z170 Estrogen receptor positive status [ER+]: Secondary | ICD-10-CM | POA: Diagnosis not present

## 2018-07-30 DIAGNOSIS — C50011 Malignant neoplasm of nipple and areola, right female breast: Secondary | ICD-10-CM | POA: Diagnosis not present

## 2018-07-30 DIAGNOSIS — C50012 Malignant neoplasm of nipple and areola, left female breast: Secondary | ICD-10-CM | POA: Diagnosis not present

## 2018-07-30 DIAGNOSIS — J449 Chronic obstructive pulmonary disease, unspecified: Secondary | ICD-10-CM | POA: Diagnosis not present

## 2018-07-30 DIAGNOSIS — R0609 Other forms of dyspnea: Secondary | ICD-10-CM | POA: Diagnosis not present

## 2018-07-31 ENCOUNTER — Telehealth: Payer: Self-pay | Admitting: *Deleted

## 2018-07-31 NOTE — Telephone Encounter (Signed)
Almyra Free, please reach out to [place]. I need to talk to him first before arrange patient for chemotherapy.  Dr.Fleming has not returned my phone call.

## 2018-07-31 NOTE — Telephone Encounter (Signed)
Patient called and states she has decided to take chemotherapy and would like a return call from Dr Tasia Catchings to discuss sp,e things one of which is that she has appointment to Dr Baruch Gouty and she wants to know if she needs to keep that appointment. Please return her call [phone]

## 2018-08-05 ENCOUNTER — Other Ambulatory Visit: Payer: Self-pay

## 2018-08-05 ENCOUNTER — Ambulatory Visit
Admission: RE | Admit: 2018-08-05 | Discharge: 2018-08-05 | Disposition: A | Discharge status: Home / Self Care | Payer: PPO | Source: Ambulatory Visit | Attending: Radiation Oncology | Admitting: Radiation Oncology

## 2018-08-05 ENCOUNTER — Encounter: Payer: Self-pay | Admitting: Radiation Oncology

## 2018-08-05 DIAGNOSIS — Z170 Estrogen receptor positive status [ER+]: Secondary | ICD-10-CM | POA: Insufficient documentation

## 2018-08-05 DIAGNOSIS — E039 Hypothyroidism, unspecified: Secondary | ICD-10-CM | POA: Insufficient documentation

## 2018-08-05 DIAGNOSIS — Z801 Family history of malignant neoplasm of trachea, bronchus and lung: Secondary | ICD-10-CM | POA: Diagnosis not present

## 2018-08-05 DIAGNOSIS — Z87891 Personal history of nicotine dependence: Secondary | ICD-10-CM | POA: Diagnosis not present

## 2018-08-05 DIAGNOSIS — C50911 Malignant neoplasm of unspecified site of right female breast: Secondary | ICD-10-CM | POA: Diagnosis not present

## 2018-08-05 DIAGNOSIS — Z7984 Long term (current) use of oral hypoglycemic drugs: Secondary | ICD-10-CM | POA: Diagnosis not present

## 2018-08-05 DIAGNOSIS — C50912 Malignant neoplasm of unspecified site of left female breast: Secondary | ICD-10-CM

## 2018-08-05 DIAGNOSIS — Z9013 Acquired absence of bilateral breasts and nipples: Secondary | ICD-10-CM | POA: Diagnosis not present

## 2018-08-05 DIAGNOSIS — Z79899 Other long term (current) drug therapy: Secondary | ICD-10-CM | POA: Diagnosis not present

## 2018-08-05 DIAGNOSIS — Z8049 Family history of malignant neoplasm of other genital organs: Secondary | ICD-10-CM | POA: Diagnosis not present

## 2018-08-05 DIAGNOSIS — Z9981 Dependence on supplemental oxygen: Secondary | ICD-10-CM | POA: Diagnosis not present

## 2018-08-05 DIAGNOSIS — C50312 Malignant neoplasm of lower-inner quadrant of left female breast: Secondary | ICD-10-CM | POA: Diagnosis not present

## 2018-08-05 DIAGNOSIS — J449 Chronic obstructive pulmonary disease, unspecified: Secondary | ICD-10-CM | POA: Diagnosis not present

## 2018-08-05 DIAGNOSIS — Z87442 Personal history of urinary calculi: Secondary | ICD-10-CM | POA: Diagnosis not present

## 2018-08-05 DIAGNOSIS — K219 Gastro-esophageal reflux disease without esophagitis: Secondary | ICD-10-CM | POA: Insufficient documentation

## 2018-08-05 DIAGNOSIS — M1990 Unspecified osteoarthritis, unspecified site: Secondary | ICD-10-CM | POA: Diagnosis not present

## 2018-08-05 DIAGNOSIS — E119 Type 2 diabetes mellitus without complications: Secondary | ICD-10-CM | POA: Insufficient documentation

## 2018-08-05 NOTE — Consult Note (Signed)
NEW PATIENT EVALUATION  Name: Amy Woodard  MRN: [id number]  Date:   08/05/2018     DOB: 02-Apr-1941   This 77 y.o. 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 Breast Cancer    Initial eval    DIAGNOSIS: There were no encounter diagnoses.   PREVIOUS INVESTIGATIONS:  Mammograms and ultrasound reviewed Pathology reports reviewed Clinical notes reviewed Case presented at breast cancer tumor conference  HPI: patient is a 77 year old female on nasal oxygen who presented with a self discovered mass in the right breast.mammogram of the right breast showing a 1.6 x 1.5 cm mass at the 12:00 position with no enlarged nodes.Biopsy was positive for invasive mammary carcinoma grade 3. She underwent wide local excision showing a 1.7 cm with high-grade DCIS present. 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 Sentinel lymph node was negative. Tumor was strongly ER/PR positive HER-2/neu not overexpressed. MRI of the breasts were performed which demonstrated a 1.4 cm mass in the lower inner quadrant the left breast. Left breast biopsy showed an invasive ductal carcinoma grade 3 reviewed in [place]. On review this was thought to be ductal carcinoma in situ high-grade with questionable of microinvasion.patient had postoperative hematoma of the left axilla which was evacuated.he had a reexcision although clips were left behind and follow-up mammogram was recommended.Oncotype DX of the right breast invasive cancer was a recurrence risk of 31and 50% benefit from chemotherapy and she has excepted treatment. She has been cleared by her pulmonologist for that treatment.She seen today for consideration of radiation therapy. She  is healing well she is sore in her bilateral breasts. She specifically denies cough or bone pain.  PLANNED TREATMENT REGIMEN: bilateral breast irradiation  PAST MEDICAL HISTORY:  has a past medical history of Allergy, Arthritis, Breast cancer ([place]) (05/2018), Cellulitis of breast (05/2018), COPD (chronic obstructive pulmonary disease) ([place]), Diabetes mellitus without complication ([place]), Dyspnea, GERD (gastroesophageal reflux disease), History of kidney stones, Hypothyroidism, and Oxygen dependent.    PAST SURGICAL HISTORY:  Past Surgical History:  Procedure Laterality Date  . APPENDECTOMY    . BACK SURGERY     lumbar. herniated disc . no metal  . BREAST BIOPSY Right 03/19/2018   INVASIVE MAMMARY CARCINOMA, NO SPECIAL TYPE  . BREAST BIOPSY Left 06/01/2018   MRI bx, grade III invasive ductal carcinoma  . BREAST BIOPSY Right 06/01/2018   MRI bx of enhancing mass, path showed FOREIGN BODY GRANULOMATOUS RESPONSE   . 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 BREAST LUMPECTOMY Right 07/03/2018   Procedure: RE-EXCISION OF RIGHT BREAST TISSUE MASS;  Surgeon: Benjamine Sprague, DO;  Location: [facility] ORS;  Service: General;  Laterality: Right;  . IRRIGATION AND DEBRIDEMENT HEMATOMA Left 07/10/2018   Procedure: IRRIGATION AND DEBRIDEMENT HEMATOMA LEFT AXILLARY;  Surgeon: Benjamine Sprague, DO;  Location: [facility] ORS;  Service: General;  Laterality: Left;  [name] MASTECTOMY    . PARTIAL MASTECTOMY WITH NEEDLE LOCALIZATION AND AXILLARY SENTINEL LYMPH NODE BX Right 04/16/2018   Procedure: PARTIAL MASTECTOMY WITH NEEDLE LOCALIZATION AND AXILLARY SENTINEL LYMPH NODE BX;  Surgeon: Benjamine Sprague, DO;  Location: [facility] ORS;  Service: General;  Laterality: Right;  . PARTIAL MASTECTOMY WITH NEEDLE LOCALIZATION AND AXILLARY SENTINEL  LYMPH NODE BX Left 07/03/2018   Procedure: PARTIAL MASTECTOMY WITH NEEDLE LOCALIZATION AND SENTINEL LYMPH NODE BX;  Surgeon: Benjamine Sprague, DO;  Location: [facility] ORS;  Service: General;   Laterality: Left;    FAMILY HISTORY: family history includes Alcohol abuse in her father; Cervical cancer in her other; Clotting disorder in her mother; Lung cancer in her sister; Melanoma in her mother.  SOCIAL HISTORY:  reports that she quit smoking about 25 years ago. Her smoking use included cigarettes. She has never used smokeless tobacco. She reports that she does not drink alcohol or use drugs.  ALLERGIES: Sulfa antibiotics  MEDICATIONS:  Current Outpatient Medications  Medication Sig Dispense Refill  . acetaminophen (TYLENOL) 650 MG CR tablet Take 1 tablet (650 mg total) by mouth every 8 (eight) hours as needed for pain. 30 tablet 0  . azelastine (ASTELIN) 0.1 % nasal spray Place 1 spray into both nostrils 2 (two) times daily. Use in each nostril as directed    . budesonide-formoterol (SYMBICORT) 160-4.5 MCG/ACT inhaler Inhale 2 puffs into the lungs 2 (two) times daily.     . folic acid (FOLVITE) 1 MG tablet Take 1 mg by mouth daily.    [name] glipiZIDE (GLUCOTROL) 5 MG tablet Take 5 mg by mouth daily before breakfast.    . hydroxychloroquine (PLAQUENIL) 200 MG tablet Take 400 mg by mouth daily.     . Ipratropium-Albuterol (COMBIVENT) 20-100 MCG/ACT AERS respimat Inhale 1 puff into the lungs every 6 (six) hours as needed for wheezing or shortness of breath.     . levothyroxine (SYNTHROID, LEVOTHROID) 50 MCG tablet Take 50 mcg by mouth daily before breakfast.    . oxyCODONE-acetaminophen (PERCOCET) 5-325 MG tablet Take 1 tablet by mouth every 4 (four) hours as needed for severe pain. (Patient not taking: Reported on 07/29/2018) 10 tablet 0   No current facility-administered medications for this encounter.     ECOG PERFORMANCE STATUS:  0 - Asymptomatic  REVIEW OF SYSTEMS:  Patient denies any weight loss, fatigue, weakness, fever, chills or night sweats. Patient denies any loss of vision, blurred vision. Patient denies any ringing  of the ears or hearing loss. No irregular heartbeat.  Patient denies heart murmur or history of fainting. Patient denies any chest pain or pain radiating to her upper extremities. Patient denies any shortness of breath, difficulty breathing at night, cough or hemoptysis. Patient denies any swelling in the lower legs. Patient denies any nausea vomiting, vomiting of blood, or coffee ground material in the vomitus. Patient denies any stomach pain. Patient states has had normal bowel movements no significant constipation or diarrhea. Patient denies any dysuria, hematuria or significant nocturia. Patient denies any problems walking, swelling in the joints or loss of balance. Patient denies any skin changes, loss of hair or loss of weight. Patient denies any excessive worrying or anxiety or significant depression. Patient denies any problems with insomnia. Patient denies excessive thirst, polyuria, polydipsia. Patient denies any swollen glands, patient denies easy bruising or easy bleeding. Patient denies any recent infections, allergies or URI. Patient "s visual fields have not changed significantly in recent time.    PHYSICAL EXAM: BP (!) (P) 144/67 (BP Location: Left Arm, Patient Position: Sitting)   Pulse (!) (P) 101   Temp (P) 98.6 F (37 C)   Wt (P) 159 lb 11.6 oz (72.4 kg)   BMI (P) 23.59 kg/m  Patient is on nasal oxygen.Well-developed well-nourished patient in NAD. HEENT reveals PERLA, EOMI, discs not visualized.  Oral cavity  is clear. No oral mucosal lesions are identified. Neck is clear without evidence of cervical or supraclavicular adenopathy. Lungs are clear to A&P. Cardiac examination is essentially unremarkable with regular rate and rhythm without murmur rub or thrill. Abdomen is benign with no organomegaly or masses noted. Motor sensory and DTR levels are equal and symmetric in the upper and lower extremities. Cranial nerves II through XII are grossly intact. Proprioception is intact. No peripheral adenopathy or edema is identified. No motor or  sensory levels are noted. Crude visual fields are within normal range.  LABORATORY DATA: pathology reports reviewed    RADIOLOGY RESULTS:mammogram and ultrasound and MRI scans reviewed   IMPRESSION: stage I invasive mammary carcinoma the right breast and ductal carcinoma in situ of the left breast in 77 year old female. Patient will undergo adjuvant chemotherapy prior to initiating radiation therapy.  PLAN: at this time I would recommend after adjuvant chemotherapy hypofractionated course of radiation therapy to both breasts over 4 weeks. Would also boost her scar another 1600 cGy based on the closeand questionable margins. Risks and benefits of treatment including skin reaction fatigue alteration of blood counts possible inclusion of superficial lung all were discussed in detail with the patient. I will reconsult her again after completion of her chemotherapy. Patient is to call with any concerns at any time.  I would like to take this opportunity to thank you for allowing me to participate in the care of your patient.Noreene Filbert, MD

## 2018-08-10 ENCOUNTER — Other Ambulatory Visit: Payer: PPO

## 2018-08-10 DIAGNOSIS — Z170 Estrogen receptor positive status [ER+]: Secondary | ICD-10-CM | POA: Diagnosis not present

## 2018-08-10 DIAGNOSIS — C50411 Malignant neoplasm of upper-outer quadrant of right female breast: Secondary | ICD-10-CM | POA: Diagnosis not present

## 2018-08-10 NOTE — Patient Instructions (Signed)

## 2018-08-11 ENCOUNTER — Inpatient Hospital Stay: Payer: PPO | Attending: Oncology

## 2018-08-12 ENCOUNTER — Other Ambulatory Visit: Payer: Self-pay

## 2018-08-12 ENCOUNTER — Ambulatory Visit: Payer: Self-pay | Admitting: Family Medicine

## 2018-08-12 ENCOUNTER — Other Ambulatory Visit: Payer: Self-pay | Admitting: Pharmacist

## 2018-08-12 ENCOUNTER — Encounter
Admission: RE | Admit: 2018-08-12 | Discharge: 2018-08-12 | Disposition: A | Discharge status: Home / Self Care | Payer: PPO | Source: Ambulatory Visit | Attending: Surgery | Admitting: Surgery

## 2018-08-12 ENCOUNTER — Ambulatory Visit: Payer: Self-pay | Admitting: Surgery

## 2018-08-12 DIAGNOSIS — N611 Abscess of the breast and nipple: Secondary | ICD-10-CM | POA: Diagnosis not present

## 2018-08-12 DIAGNOSIS — Z01812 Encounter for preprocedural laboratory examination: Secondary | ICD-10-CM | POA: Insufficient documentation

## 2018-08-12 MED ORDER — CEFAZOLIN SODIUM-DEXTROSE 2-4 GM/100ML-% IV SOLN: 2.0000 g | INTRAVENOUS | Status: DC

## 2018-08-12 NOTE — H&P (Deleted)
  The note originally documented on this encounter has been moved the the encounter in which it belongs.  

## 2018-08-12 NOTE — Patient Outreach (Signed)
[facility]) Care Management  [place]   08/12/2018  Amy Woodard 1941-08-02 [id number]   Reason for referral: Medication Reconciliation Post Discharge  Current insurance: [employer]   PMHx: Breast cancer, T2DM, rheumatoid arthritis, COPD, hypothyroidism, RLS   HPI: Patient recently [facility] to [facility]12[facility]2017 for evacuation of postoperative left axillary sentinel lymph node biopsy site hematoma. She notes that she is doing well today, and has decided to undergo chemotherapy. She has a pre-op appointment today and plans for port insertion tomorrow.   Upon review of medications, she notes that she is not regularly checking blood sugars. She denies any episodes of hypoglycemia since she has been home, though notes she had some low blood sugars while she was admitted.   She denies any questions or affordability concerns with her medications today.    Objective: Lab Results  Component Value Date   CREATININE 0.46 07/12/2018   CREATININE 0.55 07/11/2018   CREATININE 0.64 07/03/2018   A1c 6.6% on 05/05/17 per [facility]   BP Readings from Last 3 Encounters:  08/05/18 (!) (P) 144/67  07/29/18 140/63  07/13/18 (!) 151/57    Allergies  Allergen Reactions  . Sulfa Antibiotics Itching    Medications Reviewed Today    Reviewed by De Hollingshead, [facility] (Pharmacist) on 08/12/18 at [place] List Status: <None>  Medication Order Taking? Sig Documenting Provider Last Dose Status Informant  acetaminophen (TYLENOL) 650 MG CR tablet [id number] Yes Take 1 tablet (650 mg total) by mouth every 8 (eight) hours as needed for pain. Benjamine Sprague, DO Taking Active Self  azelastine (ASTELIN) 0.1 % nasal spray [id number] Yes Place 1 spray into both nostrils 2 (two) times daily.  [provider] Taking Active Self  budesonide-formoterol (SYMBICORT) 160-4.5 MCG/ACT inhaler [id number] Yes Inhale 2 puffs into the lungs 2 (two)  times daily.  [provider] Taking Active Self  folic acid (FOLVITE) 1 MG tablet [id number] Yes Take 1 mg by mouth daily. [provider] Taking Active Self  glipiZIDE (GLUCOTROL) 5 MG tablet [id number] Yes Take 5 mg by mouth daily before breakfast. [provider] Taking Active Self  hydroxychloroquine (PLAQUENIL) 200 MG tablet [id number] Yes Take 400 mg by mouth daily.  [provider] Taking Active Self           Med Note Darnelle Maffucci, Arville Lime   Wed Aug 12, 2018 11:02 AM) Takes twice daily  levothyroxine (SYNTHROID, LEVOTHROID) 50 MCG tablet [id number] Yes Take 50 mcg by mouth daily before breakfast. [provider] Taking Active Self       Patient not taking:       Discontinued 08/12/18 1103 (Completed Course)   tiZANidine (ZANAFLEX) 4 MG tablet [id number]  Take 4 mg by mouth every 6 (six) hours as needed for muscle spasms. [provider]  Active Self          Assessment:  Date Discharged from Hospital: 07/13/2018 Date Medication Reconciliation Performed: 08/12/2018  Patient was recently discharged from hospital and all medications have been reviewed.  Drugs sorted by system:  Pulmonary/Allergy: azelastine nasal spray, Symbicort (budesonide/formoterol)   Endocrine: levothyroxine, glipizide  Pain: acetaminophen  Vitamins/Minerals/Supplements: folic acid  Miscellaneous: hydroxychloroquine  Medication Review Findings:  . Diabetes: Most recent A1c was very well controlled; Consider rechecking A1c as it has been over a year. Patient may not need glipizide therapy; weight risk vs benefit of continuation, particularly as patient begins chemotherapy and may not continue to have  the same appetite.   PLAN: - Patient has my contact information for any future questions or concerns - Will route note to PCP for [place], PharmD PGY2 [facility] Pharmacy Resident, [place] Phone: [phone]

## 2018-08-12 NOTE — H&P (Signed)
Subjective:   CC: Malignant neoplasm of upper-outer quadrant of right breast in female, estrogen receptor positive (CMS-HCC) [C50.411, Z17.0] POSTOP  HPI:  Amy Woodard is a 77 y.o. female who is here for followup from above.  Returns today to discuss port placement.  Plan is neoadjuvant chemo followed by bilateral radiation therapy.  She has been doing well since last visit.  No questions or concerns.   Current Medications: has a current medication list which includes the following prescription(s): azelastine, budesonide-formoterol, folic acid, glipizide, ipratropium-albuterol, levothyroxine, and tizanidine.  Allergies:      Allergies  Allergen Reactions  . Sulfa (Sulfonamide Antibiotics) Itching and Rash  . Sulfasalazine Itching    ROS: A 15 point review of systems was performed and pertinent positives and negatives noted in HPI   Objective:   BP 126/59   Pulse 96   Temp 37.3 C (99.2 F) (Oral)   Ht 172.7 cm (_0 )   Wt 72.6 kg (160 lb 0.9 oz)   BMI 24.34 kg/m   Constitutional :  alert, appears stated age, cooperative and no distress  Musculoskeletal: Steady gait and movement  Skin: Cool and moist, incisions clean, dry completely healed over.  Small,   Psychiatric: Normal affect, non-agitated, not confused       LABS:  Surgical Pathology CASE: [id number] PATIENT: Amy Woodard Surgical Pathology Report     SPECIMEN SUBMITTED: A. Breast, right; deep margins B. Breast mass, left C. Breast, left, anterior medial margin D. Breast, left; deep lateral margins E. Breast, left; posterior margins F. Sentinel lymph node 1 G. Sentinel lymph node 2  CLINICAL HISTORY: None provided  PRE-OPERATIVE DIAGNOSIS: Left breast cancer (ductal), right breast cancer (lobular)  POST-OPERATIVE DIAGNOSIS: Same as pre-op     DIAGNOSIS: A. BREAST DEEP MARGIN, RIGHT; LUMPECTOMY: - NEGATIVE FOR RESIDUAL MALIGNANCY. - BIOPSY SITE CHANGE ALONG ANTERIOR ASPECT OF  SPECIMEN.  B.BREAST MASS, LEFT NEEDLE LOCALIZED LUMPECTOMY: - BIOPSY SITE CHANGE AT ANTERIOR MARGIN AT MEDIAL / INFERIOR ASPECT OF THE SPECIMEN. - NEGATIVE FOR RESIDUAL MALIGNANCY.  C.BREAST ANTERIOR MEDIAL MARGIN, LEFT; EXCISION: - BIOPSY SITE CHANGE ALONG DEEP, LATERAL, AND MEDIAL ASPECTS OF THE SPECIMEN. - NEGATIVE FOR RESIDUAL MALIGNANCY.  D.BREAST DEEP LATERAL MARGIN, LEFT; EXCISION: - BIOPSY SITE CHANGE ALONG ANTERIOR ASPECT OF SPECIMEN. - NEGATIVE FOR RESIDUAL MALIGNANCY. - SKELETAL MUSCLE PRESENT AT DEEP ASPECT.  E.BREAST POSTERIOR MARGIN, LEFT; EXCISION: - FIBROADIPOSE TISSUE WITH CAUTERY ARTIFACT. - NEGATIVE FOR MALIGNANCY.  F.SENTINEL LYMPH NODE, #1 LEFT; EXCISION: - ONE LYMPH NODE NEGATIVE FOR MALIGNANCY (0/1).  G.SENTINEL LYMPH NODE, #2 LEFT; EXCISION: - ONE LYMPH NODE NEGATIVE FOR MALIGNANCY (0/1).  Comment: The outside slides on this patient's left breast biopsy ([id number], performed 06/01/2018) were requested and reviewed in conjunction with the sign-out of this case. High-grade ductal carcinoma in situ is present with focal areas suspicious for invasion. Definite invasive carcinoma is not identified. DCIS is present in both outside blocks/slides of the left breast biopsy and measures 7 mm and 6 mm respectively. The DCIS is ER positive and PR positive. It was noted in the outside report that there were insufficient tumor cells for HER2 analysis. Based on these findings AJCC Staging of the left breast is pTis pN0 (sn). Given the presence of biopsy site change in several of the current left re-excision specimens without residual tumor it is possible that there was complete/near complete removal of the left breast lesion by the MRI biopsy. Follow-up MRI after an appropriate interval is advised to ensure the lesion  has been completely removed. These findings were discussed with Dr. Tasia Catchings on 07/07/2018.  GROSS DESCRIPTION:   A.Intraoperative  Consultation: Labeled: Right breast deep margin Received: Fresh Specimen: Lumpectomy Pathologic evaluation performed: Immediate gross evaluation of margins Diagnosis: IOC: No obvious mass Communicated to: Dr. Lysle Pearl at 12:03 PM on 07/03/2018, Quay Burow MD Tissue submitted: Not applicable  B Intraoperative Consultation: Labeled: Left breast mass Received: Fresh Specimen: Left breast mass Pathologic evaluation performed: Immediate gross evaluation of margins Diagnosis: Biopsy site change at anterior margin along medial aspect Communicated to: Dr. Lysle Pearl at 2:11 PM on 07/03/2018, Quay Burow MD Tissue submitted: Not applicable  C. Intraoperative Consultation: Labeled: Left breast anterior medial margin Received: Fresh Specimen: Lumpectomy Pathologic evaluation performed: Immediate gross evaluation of margins Diagnosis: IOC: No gross lesion identified Communicated to: Dr. Lysle Pearl at 2:42 PM on 07/03/2018, Quay Burow MD Tissue submitted: Not applicable  D. Intraoperative Consultation: Labeled: Left breast deep lateral margins Received: Fresh Specimen: Lumpectomy Pathologic evaluation performed: Immediate gross evaluation of margins Diagnosis: IOC: No gross lesion identified Communicated to: Dr. Lysle Pearl at 3:02 PM on 07/03/2018, Quay Burow MD Tissue submitted: Not applicable  E. Intraoperative Consultation: Labeled: Left breast posterior margin Received: Fresh Specimen: Lumpectomy Pathologic evaluation performed: Immediate gross evaluation of margins Diagnosis: No gross lesion identified Communicated to: Dr. Lysle Pearl at 3:02 PM on 07/03/2018, Quay Burow MD Tissue submitted: Not applicable  A. Labeled: Right breast deep margin Received: Fresh Accompanying specimen radiograph: No Radiographic findings: Not applicable Time in  fixative: 12:05 PM on 07/03/2018  Cold ischemic time: 15 minutes Total fixation time: 15 hours Type of procedure: Lumpectomy Location / laterality of specimen: Right Orientation of specimen: Short superior, long lateral, double deep Inking: Superior = blue Inferior = green Medial = yellow Lateral = orange Posterior = black Anterior/Superficial = red Size of specimen: 5.6 (medial-lateral) by 4.1 (superior-inferior) by 1.5 (anterior-posterior) centimeter Skin: Not present Biopsy site: Anterior surface smooth and appears to be old biopsy cavity  Number of discrete masses: No mass grossly identified Description of remainder of tissue: Yellow lobulated fibrofatty  Block summary: 1-3 - perpendicular entire lateral aspect with deep, respectively (inferior towards superior 4-9 remaining lateral half of the specimen (lateral towards medial consecutively) (4-5, 6-7, 8-9 is one section bisected) 10 - representative perpendicular medial  B. Labeled: Left breast mass Received: Fresh Accompanying specimen radiograph: Yes Radiographic findings: one wire entering anterior inferior aspect Time in fixative: 2:12 PM on 07/03/2018  Cold ischemic time: 23 minutes Total fixation time: 9 hours Type of procedure: Lumpectomy Location / laterality of specimen: Left Orientation of specimen: Long lateral, short superior, double deep Inking: Superior = blue Inferior = green Medial = yellow Lateral = orange Posterior = black Anterior/Superficial = red Size of specimen: 4.8 (superior-inferior) by 4.1 (anterior-posterior) by 1.1 (medial-lateral) centimeter Skin: Not present Biopsy site: one firm and suggestive of biopsy site  Number of discrete masses: one firm area Size of mass(es)/firm aspect: 0.9 x 0.9 x 0.6 cm Description of mass(es): Slightly firm with a small amount of nodularity and hemorrhage Distance between masses/clips: Not applicable, no clip identified Margins: Abutting  anterior, medial, 0.1 cm from inferior, 1.5 cm from superior, 1.1 cm from deep and 0.8 cm from lateral Description of remainder of tissue: Yellow lobulated fibrofatty  Block summary: 1-6 - entire firm mass area from inferior towards superior consecutively 7 - perpendicular superior  C. Labeled: Left breast anterior medial margin Received: Fresh Accompanying specimen radiograph: No Radiographic findings: Not applicable Time in  fixative: 2:42 PM on 07/03/2018  Cold ischemic time: 14 minutes Total fixation time: 8.25 Type of procedure: Lumpectomy Location / laterality of specimen: Left Orientation of specimen: Long lateral, short superior, double deep Inking: Superior = blue Inferior = green Medial = yellow Lateral = orange Posterior = black Anterior/Superficial = red Size of specimen: 4.5 (medial-lateral) by 2.8 (superior-inferior) by 1.0 (anterior-posterior) centimeter Skin: Not present Biopsy site: Not grossly identified Number of discrete masses: 0 Description of remainder of tissue: Yellow lobulated with focal hemorrhage  Block summary: 1-3 - representative sections  D. Labeled: Left breast deep lateral margin Received: Fresh Accompanying specimen radiograph: Yes Radiographic findings: Nothing noted Time in fixative: 3:02 PM on 07/03/2018  Cold ischemic time: 27 minutes Total fixation time: 8 hours Type of procedure: Lumpectomy Location / laterality of specimen: Left Orientation of specimen: Long lateral, short superior, double deep Inking: Superior = blue Inferior = green Medial = yellow Lateral = orange Posterior = black Anterior/Superficial = red Size of specimen: 3.5 (medial-lateral) by 3.2 (superior-inferior) by 0.9 (anterior-posterior) centimeter Skin: Not present Biopsy site: None identified Number of discrete masses: 0 Description of remainder of tissue: Yellow lobulated fibrofatty tissue  Block summary: 1-3 - representative tissue  E.  Labeled: Left breast posterior margin Received: Fresh Accompanying specimen radiograph: Yes Radiographic findings: Nothing noted Time in fixative: 3:02 PM on 07/03/2018  Cold ischemic time: 23 minutes Total fixation time: 8 hours Type of procedure: Lumpectomy Location / laterality of specimen: Left Orientation of specimen: Confirmed with OR nurse at 2:54 PM long lateral, short superior, double deep Inking: Superior = blue Inferior = green Medial = yellow Lateral = orange Posterior = black Anterior/Superficial = red Size of specimen: 4.0 (superior-inferior) by 3.0 (medial-lateral) by 0.5 (anterior-posterior) centimeter Skin: Not present Biopsy site: Not identified Number of discrete masses: 0 Description of remainder of tissue: Yellow lobulated fibrofatty  Block summary: 1-3 - representative sections  F. Labeled: Sentinel lymph node #1 Received: In formalin Tissue fragment(s): 1 Size: 3.2 x 2.2 x 1.0 cm Description: Fragment of yellow lobulated fibrofatty tissue with a 1.5 x 1.0 x 0.5 cm predominantly fatty replaced lymph node embedded in fatty tissue, bisected Entirely submitted in one cassette   G. Labeled: Sentinel lymph node #2 Received: In formalin Tissue fragment(s): 1 Size: 3.2 x 1.5 x 0.5 cm Description: Fragment of yellow lobulated fibrofatty tissue with a 0.8 x 0.5 x 0.2 cm firm fragment suggestive of node Entire suggestive of node submitted in one cassette    Final Diagnosis performed by Quay Burow, MD. Electronically signed 07/07/2018 3:41:25PM The electronic signature indicates that the named Attending Pathologist has evaluated the specimen  Technical component performed at [facility], [address] [place], [place] [address] Lab: [phone] Dir: Rush Farmer, MD, MMMProfessional component performed at [facility], [facility] [place], [place], [place] [address] Lab: [phone] Dir: Dellia Nims. Rubinas, MD    RADS: N/A  Assessment:      Malignant neoplasm of upper-outer quadrant of right breast in female, estrogen receptor positive (CMS-HCC) [C50.411, Z17.0]  Plan:   Will proceed with port placement.  Risk alternative benefits discussed.  Risks include bleeding, infection, dislocation, migration, malfunction, unsuccessful placement, pneumothorax, and additional procedures to address that risks.  Benefits include initiation of chemotherapy.  Alternative includes non-IV infusion therapy.  We discussed the need for the port to infuse IV chemotherapy agents, and how the port can be a temporary and/or permanent option depending on patient preference after completion of chemotherapy.  Port will be okay to use as soon as it is placed.  Patient verbalized understanding and all questions and concerns addressed.     Electronically signed by Benjamine Sprague, DO on 08/10/2018 9:43 AM

## 2018-08-12 NOTE — Patient Instructions (Addendum)
Your procedure is scheduled for tomorrow 08/13/18.  Report to [facility] at 7:45am.   Remember: Instructions that are not followed completely may result in serious medical risk, up to and including death, or upon the discretion of your surgeon and anesthesiologist your surgery may need to be rescheduled.     _X__ 1. Do not eat food after midnight the night before your procedure.                 No gum chewing or hard candies. You may drink clear liquids up to 2 hours                 before you are scheduled to arrive for your surgery- DO NOT drink clear                 liquids within 2 hours of the start of your surgery.                 Clear Liquids include:  water, apple juice without pulp, clear carbohydrate                 drink such as Clearfast or Gatorade, Black Coffee or Tea (Do not add                 anything to coffee or tea).   __X__2.  On the morning of surgery brush your teeth with toothpaste and water, you may rinse your mouth with mouthwash if you wish.  Do not swallow any toothpaste or mouthwash.     Do not wear jewelry, make-up, hairpins, clips or nail polish. Do not wear lotions, powders, or perfumes. Do not wear deodorant. Do not shave 48 hours prior to surgery. Men may shave face and neck. Do not bring valuables to the hospital.    [facility] is not responsible for any belongings or valuables.  Contacts, dentures/partials or body piercings may not be worn into surgery. Bring a case for your contacts, glasses or hearing aids, a denture cup will be supplied.    Patients discharged the day of surgery will not be allowed to drive home.                  __X__ Take these medicines the morning of surgery with A SIP OF WATER:     1. budesonide-formoterol (SYMBICORT) 160-4.5 MCG/ACT inhaler  2. azelastine (ASTELIN) 0.1 % nasal spray  3. levothyroxine (SYNTHROID, LEVOTHROID) 50 MCG tablet  4.  hydroxychloroquine (PLAQUENIL) 200 MG tablet  5. omeprazole (PRILOSEC) 20 MG capsule TAKE TONIGHT AND IN THE MORNING     __X__ Use CHG Soap as directed   _ X___ Use inhalers on the day of surgery.   __X__ Stop Anti-inflammatories 7 days before surgery such as Advil, Ibuprofen, Motrin, BC or Goodies Powder, Naprosyn, Naproxen, Aleve, Aspirin, Meloxicam. May take Tylenol if needed for pain or discomfort.

## 2018-08-13 ENCOUNTER — Encounter
Admission: RE | Disposition: A | Discharge status: Home / Self Care | Payer: Self-pay | Source: Ambulatory Visit | Attending: Surgery

## 2018-08-13 ENCOUNTER — Ambulatory Visit
Admission: RE | Admit: 2018-08-13 | Discharge: 2018-08-13 | Disposition: A | Discharge status: Home / Self Care | Payer: PPO | Source: Ambulatory Visit | Attending: Surgery | Admitting: Surgery

## 2018-08-13 DIAGNOSIS — M069 Rheumatoid arthritis, unspecified: Secondary | ICD-10-CM

## 2018-08-13 DIAGNOSIS — E119 Type 2 diabetes mellitus without complications: Secondary | ICD-10-CM | POA: Insufficient documentation

## 2018-08-13 DIAGNOSIS — K219 Gastro-esophageal reflux disease without esophagitis: Secondary | ICD-10-CM

## 2018-08-13 DIAGNOSIS — Z9981 Dependence on supplemental oxygen: Secondary | ICD-10-CM

## 2018-08-13 DIAGNOSIS — E039 Hypothyroidism, unspecified: Secondary | ICD-10-CM

## 2018-08-13 DIAGNOSIS — C50411 Malignant neoplasm of upper-outer quadrant of right female breast: Secondary | ICD-10-CM | POA: Insufficient documentation

## 2018-08-13 DIAGNOSIS — Z170 Estrogen receptor positive status [ER+]: Secondary | ICD-10-CM | POA: Insufficient documentation

## 2018-08-13 DIAGNOSIS — Z882 Allergy status to sulfonamides status: Secondary | ICD-10-CM | POA: Insufficient documentation

## 2018-08-13 DIAGNOSIS — N611 Abscess of the breast and nipple: Secondary | ICD-10-CM

## 2018-08-13 DIAGNOSIS — J449 Chronic obstructive pulmonary disease, unspecified: Secondary | ICD-10-CM | POA: Insufficient documentation

## 2018-08-13 DIAGNOSIS — Z79899 Other long term (current) drug therapy: Secondary | ICD-10-CM

## 2018-08-13 DIAGNOSIS — Z7984 Long term (current) use of oral hypoglycemic drugs: Secondary | ICD-10-CM | POA: Insufficient documentation

## 2018-08-13 SURGERY — INSERTION, TUNNELED CENTRAL VENOUS DEVICE, WITH PORT
Anesthesia: Choice

## 2018-08-13 MED ORDER — AMOXICILLIN-POT CLAVULANATE 875-125 MG PO TABS: 1.0000 | ORAL_TABLET | Freq: Two times a day (BID) | ORAL | 0 refills | Status: DC

## 2018-08-13 MED ORDER — LIDOCAINE HCL (PF) 1 % IJ SOLN
INTRAMUSCULAR | Status: AC
  Filled 2018-08-13: qty 5

## 2018-08-13 MED ORDER — LIDOCAINE HCL (PF) 1 % IJ SOLN
5.0000 mL | Freq: Once | INTRAMUSCULAR | Status: AC
  Administered 2018-08-13: 5 mL via INTRADERMAL

## 2018-08-13 MED ORDER — PENTAFLUOROPROP-TETRAFLUOROETH EX AERO
INHALATION_SPRAY | CUTANEOUS | Status: AC
  Filled 2018-08-13: qty 116

## 2018-08-13 SURGICAL SUPPLY — 30 items
BENZOIN TINCTURE PRP APPL 2/3 (GAUZE/BANDAGES/DRESSINGS) ×3 IMPLANT
BLADE SURG 11 STRL SS SAFETY (MISCELLANEOUS) ×3 IMPLANT
CANISTER SUCT 1200ML W/VALVE (MISCELLANEOUS) ×3 IMPLANT
CHLORAPREP W/TINT 26ML (MISCELLANEOUS) ×3 IMPLANT
CLOSURE WOUND 1/2 X4 (GAUZE/BANDAGES/DRESSINGS) ×1
COVER LIGHT HANDLE STERIS (MISCELLANEOUS) ×6 IMPLANT
COVER WAND RF STERILE (DRAPES) ×3 IMPLANT
DECANTER SPIKE VIAL GLASS SM (MISCELLANEOUS) ×3 IMPLANT
DRAPE C-ARM XRAY 36X54 (DRAPES) ×3 IMPLANT
DRAPE LAPAROTOMY TRNSV 106X77 (MISCELLANEOUS) ×3 IMPLANT
DRSG TEGADERM 4X4.75 (GAUZE/BANDAGES/DRESSINGS) ×3 IMPLANT
DRSG TELFA 4X3 1S NADH ST (GAUZE/BANDAGES/DRESSINGS) ×3 IMPLANT
ELECT CAUTERY BLADE 6.4 (BLADE) ×3 IMPLANT
ELECT REM PT RETURN 9FT ADLT (ELECTROSURGICAL) ×3
ELECTRODE REM PT RTRN 9FT ADLT (ELECTROSURGICAL) ×1 IMPLANT
GLOVE BIOGEL PI IND STRL 7.0 (GLOVE) ×1 IMPLANT
GLOVE BIOGEL PI INDICATOR 7.0 (GLOVE) ×2
GLOVE SURG SYN 7.0 (GLOVE) ×3 IMPLANT
GOWN STRL REUS W/ TWL LRG LVL3 (GOWN DISPOSABLE) ×3 IMPLANT
GOWN STRL REUS W/TWL LRG LVL3 (GOWN DISPOSABLE) ×6
KIT TURNOVER KIT A (KITS) ×3 IMPLANT
LABEL OR SOLS (LABEL) ×3 IMPLANT
PACK PORT-A-CATH (MISCELLANEOUS) ×3 IMPLANT
STRIP CLOSURE SKIN 1/2X4 (GAUZE/BANDAGES/DRESSINGS) ×2 IMPLANT
SUT MNCRL AB 4-0 PS2 18 (SUTURE) ×3 IMPLANT
SUT VIC AB 2-0 SH 27 (SUTURE) ×2
SUT VIC AB 2-0 SH 27XBRD (SUTURE) ×1 IMPLANT
SUT VIC AB 3-0 SH 27 (SUTURE) ×2
SUT VIC AB 3-0 SH 27X BRD (SUTURE) ×1 IMPLANT
SYR 10ML LL (SYRINGE) ×3 IMPLANT

## 2018-08-13 NOTE — Progress Notes (Signed)
Patient comes in for surgery today for a port placement for upcoming chemotherapy. Patient states she has not felt good since last night, chills and a headache. Per anesthesia she has a red, warm to the touch and tender area on the right breast near her incision from previous surgery. Anesthesia called Dr Lysle Pearl and wanted a partner at his office to come and look at the patient. Dr Peyton Najjar assessed the patient and did an ultrasound of the red area and spoke with Dr Lysle Pearl. Dr Lysle Pearl is going to assess patient once he is out of the OR.

## 2018-08-13 NOTE — Discharge Instructions (Signed)
-  keep bandage on for 24hrs, then ok to remove and shower - Call office during normal business hours for sudden increase in pain, discharge, redness, and/or accompanying fever.  Go to nearest urgent care/ED for afterhour care.  -Will reassses on Tuesday 12/10 prior to port placement.  If still infected, then we will proceed with port placement on the left, if improved, will proceed with right sided port placement

## 2018-08-13 NOTE — Op Note (Signed)
Pre-Op Dx: right breast abscess Post-Op Dx: same Anesthesia: local EBL: minimal Complications:  none apparent Specimen: right breast abscess Procedure: Needle aspiration of right breast abscess  Surgeon: Lysle Pearl  Indication for procedure:  77 year old female scheduled for Port-A-Cath insertion this morning.  Upon arrival to the preop area patient stated that she had some episodes of confusion as well as a fever last night along with increasing erythema in her right breast.  She underwent right partial mastectomy a couple months ago.  She did not complain of any issues with the incision site until a day or 2 ago.  Upon inspection it was noted that she had blanching cellulitis throughout the right breast mostly centered around the former incision site.  Had ultrasound performed by myself did not note a fluid collection consistent with possible abscess.  After discussion with patient and caretaker, it was decided that we will postpone the Port-A-Cath insertion today and attempt a bedside needle aspiration of the presumed right breast abscess.  We will reschedule her port placement for the following Tuesday in hopes that by then the cellulitis and abscess will be resolved and we can proceed safely.  Patient and caretaker verbalized understanding and gave verbal consent to proceed with a needle aspiration at bedside.   Description of Procedure:  Consent obtained, time out performed.  Patient placed in supine position.  Area sterilized and draped in usual position.  Local infused to area previously marked.  Under ultrasound guidance an 18-gauge needle was placed into the visible fluid cavity and dark old hematoma-like fluid was noted.  No evidence of purulent discharge mixed in with the aspirated fluid.  Approximately 35 mL's of this fluid was pulled.  Resolution of the fluid collection was confirmed on the ultrasound after no further fluid was able to be aspirated.  Needle incision area then covered with a  Band-Aid.  Patient tolerated procedure well.  She will be sent home on Augmentin and the ED return precautions were discussed.  Patient will be discharged directly from the preop area back home.

## 2018-08-14 ENCOUNTER — Emergency Department: Payer: PPO

## 2018-08-14 ENCOUNTER — Other Ambulatory Visit: Payer: Self-pay | Admitting: Oncology

## 2018-08-14 ENCOUNTER — Other Ambulatory Visit: Payer: Self-pay

## 2018-08-14 ENCOUNTER — Inpatient Hospital Stay
Admission: EM | Admit: 2018-08-14 | Discharge: 2018-08-17 | DRG: 601 | Disposition: A | Discharge status: Home / Self Care | Payer: PPO | Attending: Surgery | Admitting: Surgery

## 2018-08-14 ENCOUNTER — Encounter: Payer: Self-pay | Admitting: Emergency Medicine

## 2018-08-14 DIAGNOSIS — M069 Rheumatoid arthritis, unspecified: Secondary | ICD-10-CM | POA: Diagnosis not present

## 2018-08-14 DIAGNOSIS — N611 Abscess of the breast and nipple: Principal | ICD-10-CM | POA: Diagnosis present

## 2018-08-14 DIAGNOSIS — Z9049 Acquired absence of other specified parts of digestive tract: Secondary | ICD-10-CM | POA: Diagnosis not present

## 2018-08-14 DIAGNOSIS — J449 Chronic obstructive pulmonary disease, unspecified: Secondary | ICD-10-CM | POA: Diagnosis present

## 2018-08-14 DIAGNOSIS — N61 Mastitis without abscess: Secondary | ICD-10-CM | POA: Diagnosis not present

## 2018-08-14 DIAGNOSIS — Z9011 Acquired absence of right breast and nipple: Secondary | ICD-10-CM | POA: Diagnosis not present

## 2018-08-14 DIAGNOSIS — E039 Hypothyroidism, unspecified: Secondary | ICD-10-CM | POA: Diagnosis present

## 2018-08-14 DIAGNOSIS — C50911 Malignant neoplasm of unspecified site of right female breast: Secondary | ICD-10-CM | POA: Diagnosis not present

## 2018-08-14 DIAGNOSIS — Z7984 Long term (current) use of oral hypoglycemic drugs: Secondary | ICD-10-CM

## 2018-08-14 DIAGNOSIS — K219 Gastro-esophageal reflux disease without esophagitis: Secondary | ICD-10-CM | POA: Diagnosis not present

## 2018-08-14 DIAGNOSIS — Z9981 Dependence on supplemental oxygen: Secondary | ICD-10-CM

## 2018-08-14 DIAGNOSIS — Z7951 Long term (current) use of inhaled steroids: Secondary | ICD-10-CM

## 2018-08-14 DIAGNOSIS — E119 Type 2 diabetes mellitus without complications: Secondary | ICD-10-CM | POA: Diagnosis not present

## 2018-08-14 DIAGNOSIS — N6489 Other specified disorders of breast: Secondary | ICD-10-CM | POA: Diagnosis not present

## 2018-08-14 DIAGNOSIS — Z79899 Other long term (current) drug therapy: Secondary | ICD-10-CM

## 2018-08-14 DIAGNOSIS — Z882 Allergy status to sulfonamides status: Secondary | ICD-10-CM | POA: Diagnosis not present

## 2018-08-14 DIAGNOSIS — Z79890 Hormone replacement therapy (postmenopausal): Secondary | ICD-10-CM | POA: Diagnosis not present

## 2018-08-14 DIAGNOSIS — Z87891 Personal history of nicotine dependence: Secondary | ICD-10-CM

## 2018-08-14 DIAGNOSIS — Z87442 Personal history of urinary calculi: Secondary | ICD-10-CM | POA: Diagnosis not present

## 2018-08-14 DIAGNOSIS — C50919 Malignant neoplasm of unspecified site of unspecified female breast: Secondary | ICD-10-CM

## 2018-08-14 LAB — CBC WITH DIFFERENTIAL/PLATELET
Abs Immature Granulocytes: 0.03 10*3/uL (ref 0.00–0.07)
BASOS ABS: 0.1 10*3/uL (ref 0.0–0.1)
Basophils Relative: 1 %
Eosinophils Absolute: 0.4 10*3/uL (ref 0.0–0.5)
Eosinophils Relative: 4 %
HEMATOCRIT: 39.3 % (ref 36.0–46.0)
Hemoglobin: 12.2 g/dL (ref 12.0–15.0)
IMMATURE GRANULOCYTES: 0 %
LYMPHS ABS: 1.7 10*3/uL (ref 0.7–4.0)
LYMPHS PCT: 16 %
MCH: 27.8 pg (ref 26.0–34.0)
MCHC: 31 g/dL (ref 30.0–36.0)
MCV: 89.5 fL (ref 80.0–100.0)
Monocytes Absolute: 0.7 10*3/uL (ref 0.1–1.0)
Monocytes Relative: 7 %
NEUTROS PCT: 72 %
NRBC: 0 % (ref 0.0–0.2)
Neutro Abs: 7.4 10*3/uL (ref 1.7–7.7)
Platelets: 233 10*3/uL (ref 150–400)
RBC: 4.39 MIL/uL (ref 3.87–5.11)
RDW: 13.5 % (ref 11.5–15.5)
WBC: 10.3 10*3/uL (ref 4.0–10.5)

## 2018-08-14 LAB — BASIC METABOLIC PANEL
ANION GAP: 8 (ref 5–15)
BUN: 11 mg/dL (ref 8–23)
CO2: 30 mmol/L (ref 22–32)
Calcium: 8.7 mg/dL — ABNORMAL LOW (ref 8.9–10.3)
Chloride: 99 mmol/L (ref 98–111)
Creatinine, Ser: 0.44 mg/dL (ref 0.44–1.00)
GFR calc non Af Amer: >60 mL/min (ref >60)
Glucose, Bld: 124 mg/dL — ABNORMAL HIGH (ref 70–99)
Potassium: 4.4 mmol/L (ref 3.5–5.1)
SODIUM: 137 mmol/L (ref 135–145)

## 2018-08-14 MED ORDER — ONDANSETRON HCL 4 MG/2ML IJ SOLN: 4.0000 mg | Freq: Four times a day (QID) | INTRAMUSCULAR | Status: DC | PRN

## 2018-08-14 MED ORDER — TRAMADOL HCL 50 MG PO TABS: 50.0000 mg | ORAL_TABLET | Freq: Four times a day (QID) | ORAL | Status: DC | PRN

## 2018-08-14 MED ORDER — ACETAMINOPHEN 325 MG PO TABS
650.0000 mg | ORAL_TABLET | Freq: Four times a day (QID) | ORAL | Status: DC | PRN
  Administered 2018-08-16: 650 mg via ORAL
  Filled 2018-08-14: qty 2

## 2018-08-14 MED ORDER — HYDROXYCHLOROQUINE SULFATE 200 MG PO TABS
400.0000 mg | ORAL_TABLET | Freq: Two times a day (BID) | ORAL | Status: DC
  Filled 2018-08-14: qty 2

## 2018-08-14 MED ORDER — GLIPIZIDE 5 MG PO TABS
5.0000 mg | ORAL_TABLET | Freq: Every day | ORAL | Status: DC
  Administered 2018-08-15 – 2018-08-17 (×3): 5 mg via ORAL
  Filled 2018-08-14 (×3): qty 1

## 2018-08-14 MED ORDER — DIPHENHYDRAMINE HCL 50 MG/ML IJ SOLN
12.5000 mg | Freq: Once | INTRAMUSCULAR | Status: AC
  Administered 2018-08-14: 12.5 mg via INTRAVENOUS
  Filled 2018-08-14: qty 1

## 2018-08-14 MED ORDER — MOMETASONE FURO-FORMOTEROL FUM 200-5 MCG/ACT IN AERO
2.0000 | INHALATION_SPRAY | Freq: Two times a day (BID) | RESPIRATORY_TRACT | Status: DC
  Administered 2018-08-14 – 2018-08-17 (×6): 2 via RESPIRATORY_TRACT
  Filled 2018-08-14: qty 8.8

## 2018-08-14 MED ORDER — MORPHINE SULFATE (PF) 2 MG/ML IV SOLN: 2.0000 mg | INTRAVENOUS | Status: DC | PRN

## 2018-08-14 MED ORDER — DOCUSATE SODIUM 100 MG PO CAPS: 100.0000 mg | ORAL_CAPSULE | Freq: Two times a day (BID) | ORAL | Status: DC | PRN

## 2018-08-14 MED ORDER — VANCOMYCIN HCL IN DEXTROSE 750-5 MG/150ML-% IV SOLN
750.0000 mg | Freq: Two times a day (BID) | INTRAVENOUS | Status: DC
  Administered 2018-08-15 (×2): 750 mg via INTRAVENOUS
  Filled 2018-08-14 (×4): qty 150

## 2018-08-14 MED ORDER — HYDROCODONE-ACETAMINOPHEN 5-325 MG PO TABS: 1.0000 | ORAL_TABLET | ORAL | Status: DC | PRN

## 2018-08-14 MED ORDER — VANCOMYCIN HCL IN DEXTROSE 1-5 GM/200ML-% IV SOLN
1000.0000 mg | Freq: Once | INTRAVENOUS | Status: AC
  Administered 2018-08-14: 1000 mg via INTRAVENOUS
  Filled 2018-08-14: qty 200

## 2018-08-14 MED ORDER — AMOXICILLIN-POT CLAVULANATE 875-125 MG PO TABS
1.0000 | ORAL_TABLET | Freq: Two times a day (BID) | ORAL | Status: DC
  Administered 2018-08-14: 1 via ORAL
  Filled 2018-08-14: qty 1

## 2018-08-14 MED ORDER — PANTOPRAZOLE SODIUM 40 MG PO TBEC
40.0000 mg | DELAYED_RELEASE_TABLET | Freq: Every day | ORAL | Status: DC
  Administered 2018-08-15 – 2018-08-17 (×3): 40 mg via ORAL
  Filled 2018-08-14 (×3): qty 1

## 2018-08-14 MED ORDER — DIPHENHYDRAMINE HCL 12.5 MG/5ML PO ELIX
ORAL_SOLUTION | ORAL | Status: AC
  Filled 2018-08-14: qty 5

## 2018-08-14 MED ORDER — AZELASTINE HCL 0.1 % NA SOLN
1.0000 | Freq: Two times a day (BID) | NASAL | Status: DC
  Administered 2018-08-14 – 2018-08-17 (×5): 1 via NASAL
  Filled 2018-08-14: qty 30

## 2018-08-14 MED ORDER — ENOXAPARIN SODIUM 40 MG/0.4ML ~~LOC~~ SOLN
40.0000 mg | SUBCUTANEOUS | Status: DC
  Administered 2018-08-15 – 2018-08-17 (×3): 40 mg via SUBCUTANEOUS
  Filled 2018-08-14 (×3): qty 0.4

## 2018-08-14 MED ORDER — PIPERACILLIN-TAZOBACTAM 3.375 G IVPB
3.3750 g | Freq: Three times a day (TID) | INTRAVENOUS | Status: DC
  Administered 2018-08-14 – 2018-08-15 (×3): 3.375 g via INTRAVENOUS
  Filled 2018-08-14 (×4): qty 50

## 2018-08-14 MED ORDER — ONDANSETRON 4 MG PO TBDP: 4.0000 mg | ORAL_TABLET | Freq: Four times a day (QID) | ORAL | Status: DC | PRN

## 2018-08-14 MED ORDER — HYDROXYCHLOROQUINE SULFATE 200 MG PO TABS
800.0000 mg | ORAL_TABLET | Freq: Every day | ORAL | Status: DC
  Administered 2018-08-15 – 2018-08-17 (×3): 800 mg via ORAL
  Filled 2018-08-14 (×3): qty 4

## 2018-08-14 MED ORDER — LEVOTHYROXINE SODIUM 50 MCG PO TABS
50.0000 ug | ORAL_TABLET | Freq: Every day | ORAL | Status: DC
  Administered 2018-08-15 – 2018-08-17 (×3): 50 ug via ORAL
  Filled 2018-08-14 (×3): qty 1

## 2018-08-14 MED ORDER — FOLIC ACID 1 MG PO TABS
1.0000 mg | ORAL_TABLET | Freq: Every day | ORAL | Status: DC
  Administered 2018-08-15 – 2018-08-17 (×3): 1 mg via ORAL
  Filled 2018-08-14 (×3): qty 1

## 2018-08-14 NOTE — ED Triage Notes (Signed)
Pt sent to ED by her surgeon from [facility] for admission. Pts niece states that pt needs to have a port placed but pt has severe infection in her right breast. Pt is in NAD at this time.

## 2018-08-14 NOTE — ED Notes (Signed)
Transport pt to floor rm [id number]

## 2018-08-14 NOTE — Consult Note (Signed)
Pharmacy Antibiotic Note  Amy Woodard is a 77 y.o. female admitted on 08/14/2018 with cellulitis.  Pharmacy has been consulted for Zosyn and Vancomycin dosing.  Plan: Vancomycin 750mg  IV every 12 hours.  Goal trough 10-15 mcg/mL.  Zosyn 3.375mg  q 8hours  1 g Vancomycin administered in the ER - stacked dosing 8 hours  Will check VT prior to the 4th scheduled dose 12/8 @ 1350  Ke = 0.056, t1/2 = 12.38, VD = 50.33    Temp (24hrs), Avg:99.4 F (37.4 C), Min:99.4 F (37.4 C), Max:99.4 F (37.4 C)  Recent Labs  Lab 08/14/18 1514  WBC 10.3  CREATININE 0.44    Estimated Creatinine Clearance: 62.5 mL/min (by C-G formula based on SCr of 0.44 mg/dL).    Allergies  Allergen Reactions  . Sulfa Antibiotics Itching    Antimicrobials this admission: Vancomycin 12/6 >>  Zosyn 12/6 >>   Dose adjustments this admission: N/A  Microbiology results: None at this time  Thank you for allowing pharmacy to be a part of this patient's care.  Lu Duffel, PharmD, BCPS Clinical Pharmacist 08/14/2018 6:50 PM

## 2018-08-14 NOTE — ED Notes (Addendum)
Pt denies itching, redness following IV site has dec to light pink, pt's forehead no longer red. Pt up to bedside toilet.

## 2018-08-14 NOTE — Progress Notes (Signed)
START ON PATHWAY REGIMEN - Breast     A cycle is every 21 days:     Docetaxel      Cyclophosphamide   **Always confirm dose/schedule in your pharmacy ordering system**  Patient Characteristics: Postoperative without Neoadjuvant Therapy (Pathologic Staging), Invasive Disease, Adjuvant Therapy, HER2 Negative/Unknown/Equivocal, ER Positive, Node Negative, pT1a-c, pN0/N24m or pT2 or Higher, pN0, Oncotype High Risk (? 26) Therapeutic Status: Postoperative without Neoadjuvant Therapy (Pathologic Staging) AJCC Grade: G2 AJCC N Category: pN0 AJCC M Category: cM0 ER Status: Positive (+) AJCC 8 Stage Grouping: IA HER2 Status: Negative (-) Oncotype Dx Recurrence Score: 31 AJCC T Category: pT1c PR Status: Positive (+) Has this patient completed genomic testing<= Yes - Oncotype DX(R) Intent of Therapy: Curative Intent, Discussed with Patient

## 2018-08-14 NOTE — ED Notes (Signed)
Fever, HA, confusion night before last. R breast tough, red past the original sharpie marks, hot. Pt denies pain.

## 2018-08-14 NOTE — ED Notes (Signed)
NT will transport pt.

## 2018-08-14 NOTE — ED Notes (Signed)
Pt back from ultrasound.

## 2018-08-14 NOTE — ED Provider Notes (Signed)
[facility] Emergency Department Provider Note   ____________________________________________   I have reviewed the triage vital signs and the nursing notes.   HISTORY  Chief Complaint Breast infection   History limited by: Not Limited   HPI Amy Woodard is a 77 y.o. female who presents to the emergency department today because of concern for worsening right infection.  Patient was seen by surgery yesterday and had fluid drained from an abscess to her right breast.  She was placed on oral antibiotics.  Last night she had chills.  Also had some confusion.  The erythema went past the markings that were placed yesterday.  Contacted her surgeon's office and was recommended come to the emergency department.  The time my exam she denies any confusion.  She denies any pain.   Per medical record review patient had needle aspiration of right breast abscess  Past Medical History:  Diagnosis Date  . Allergy   . Arthritis    rheumatoid arthritis  . Breast cancer ([place]) 05/2018   Right breast found 1st, then left breast with ductal ca  . Cellulitis of breast 05/2018   right breast cellulitis after first biopsy,  antibiotics prescribed  . COPD (chronic obstructive pulmonary disease) ([place])   . Diabetes mellitus without complication ([place])   . Dyspnea   . GERD (gastroesophageal reflux disease)    occasionally  . History of kidney stones    has one but not a problem  . Hypothyroidism   . Oxygen dependent    2liter nasal prong continuously    Patient Active Problem List   Diagnosis Date Noted  . Anemia 07/29/2018  . Hematoma, chest wall 07/10/2018  . Breast cancer ([place]) 07/03/2018  . Adenomatous polyp of descending colon 11/13/2017  . Nephrolithiasis 01/12/2015  . Hypothyroidism due to acquired atrophy of thyroid 12/22/2014  . RLS (restless legs syndrome) 12/22/2014  . Hypercholesterolemia 09/22/2014  . COPD (chronic obstructive pulmonary disease) ([place])  03/24/2014  . Diabetes mellitus type 2, uncomplicated ([place]) 03/04/9484  . Rheumatoid arthritis, unspecified ([place]) 01/08/2014    Past Surgical History:  Procedure Laterality Date  . APPENDECTOMY    . BACK SURGERY     lumbar. herniated disc . no metal  . BREAST BIOPSY Right 03/19/2018   INVASIVE MAMMARY CARCINOMA, NO SPECIAL TYPE  . BREAST BIOPSY Left 06/01/2018   MRI bx, grade III invasive ductal carcinoma  . BREAST BIOPSY Right 06/01/2018   MRI bx of enhancing mass, path showed FOREIGN BODY GRANULOMATOUS RESPONSE   . 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 BREAST LUMPECTOMY Right 07/03/2018   Procedure: RE-EXCISION OF RIGHT BREAST TISSUE MASS;  Surgeon: Benjamine Sprague, DO;  Location: [facility] ORS;  Service: General;  Laterality: Right;  . IRRIGATION AND DEBRIDEMENT HEMATOMA Left 07/10/2018   Procedure: IRRIGATION AND DEBRIDEMENT HEMATOMA LEFT AXILLARY;  Surgeon: Benjamine Sprague, DO;  Location: [facility] ORS;  Service: General;  Laterality: Left;  . PARTIAL MASTECTOMY WITH NEEDLE LOCALIZATION AND AXILLARY SENTINEL LYMPH NODE BX Right 04/16/2018   Procedure: PARTIAL MASTECTOMY WITH NEEDLE LOCALIZATION AND AXILLARY SENTINEL LYMPH NODE BX;  Surgeon: Benjamine Sprague, DO;  Location: [facility] ORS;  Service: General;  Laterality: Right;  . PARTIAL MASTECTOMY WITH NEEDLE LOCALIZATION AND AXILLARY SENTINEL LYMPH NODE BX Left 07/03/2018   Procedure: PARTIAL MASTECTOMY WITH NEEDLE LOCALIZATION AND SENTINEL LYMPH NODE BX;  Surgeon: Benjamine Sprague, DO;  Location: [facility] ORS;  Service: General;  Laterality: Left;    Prior to Admission medications  Medication Sig Start Date End Date Taking? Authorizing Provider  acetaminophen (TYLENOL) 650 MG CR tablet Take 1 tablet (650 mg total) by mouth every 8 (eight) hours as needed for pain. 07/03/18   Lysle Pearl, Isami, DO  amoxicillin-clavulanate (AUGMENTIN) 875-125 MG tablet Take 1 tablet by mouth 2 (two) times daily for 7 days. 08/13/18 08/20/18  Lysle Pearl, Isami, DO   azelastine (ASTELIN) 0.1 % nasal spray Place 1 spray into both nostrils 2 (two) times daily.     [provider]  budesonide-formoterol (SYMBICORT) 160-4.5 MCG/ACT inhaler Inhale 2 puffs into the lungs 2 (two) times daily.  09/25/17 09/25/18  [provider]  folic acid (FOLVITE) 1 MG tablet Take 1 mg by mouth daily.    [provider]  glipiZIDE (GLUCOTROL) 5 MG tablet Take 5 mg by mouth daily before breakfast.    [provider]  hydroxychloroquine (PLAQUENIL) 200 MG tablet Take 400 mg by mouth daily.     [provider]  levothyroxine (SYNTHROID, LEVOTHROID) 50 MCG tablet Take 50 mcg by mouth daily before breakfast.    [provider]  omeprazole (PRILOSEC) 20 MG capsule Take 20 mg by mouth daily.    [provider]    Allergies Sulfa antibiotics  Family History  Problem Relation Age of Onset  . Clotting disorder Mother   . Melanoma Mother        deceased 59  . Lung cancer Sister        deceased 33s; smoker  . Alcohol abuse Father        deceased 20  . Cervical cancer Other        neice    Social History Social History   Tobacco Use  . Smoking status: Former Smoker    Types: Cigarettes    Last attempt to quit: 03/30/1993    Years since quitting: 25.3  . Smokeless tobacco: Never Used  Substance Use Topics  . Alcohol use: No  . Drug use: Never    Review of Systems Constitutional: No fever/chills Eyes: No visual changes. ENT: No sore throat. Cardiovascular: Denies chest pain. Respiratory: Denies shortness of breath. Gastrointestinal: No abdominal pain.  No nausea, no vomiting.  No diarrhea.   Genitourinary: Negative for dysuria. Musculoskeletal: Negative for back pain. Skin: Positive for erythema to right breast Neurological: Negative for headaches, focal weakness or numbness.  ____________________________________________   PHYSICAL EXAM:  VITAL SIGNS: ED Triage Vitals  Enc Vitals Group     BP  08/14/18 1513 (!) 169/103     Pulse Rate 08/14/18 1513 91     Resp 08/14/18 1513 18     Temp 08/14/18 1513 99.4 F (37.4 C)     Temp Source 08/14/18 1513 Oral     SpO2 08/14/18 1513 98 %     Weight --      Height --      Head Circumference --      Peak Flow --      Pain Score 08/14/18 1510 0   Constitutional: Alert and oriented.  Eyes: Conjunctivae are normal.  ENT      Head: Normocephalic and atraumatic.      Nose: No congestion/rhinnorhea.      Mouth/Throat: Mucous membranes are moist.      Neck: No stridor. Hematological/Lymphatic/Immunilogical: No cervical lymphadenopathy. Cardiovascular: Normal rate, regular rhythm.  No murmurs, rubs, or gallops.  Respiratory: Normal respiratory effort without tachypnea nor retractions. Breath sounds are clear and equal bilaterally. No wheezes/rales/rhonchi. Gastrointestinal: Soft and non  tender. No rebound. No guarding.  Genitourinary: Deferred Musculoskeletal: Normal range of motion in all extremities. No lower extremity edema. Neurologic:  Normal speech and language. No gross focal neurologic deficits are appreciated.  Skin:  Skin is warm, dry and intact. No rash noted. Psychiatric: Mood and affect are normal. Speech and behavior are normal. Patient exhibits appropriate insight and judgment.  ____________________________________________    LABS (pertinent positives/negatives)  CBC wbc 10.3, hgb 12.2, plt 233 BMP wnl except glu 124, ca 8.7  ____________________________________________   EKG  None  ____________________________________________    RADIOLOGY  US breast right Seroma, concern for possible infection ____________________________________________   PROCEDURES  Procedures  ____________________________________________   INITIAL IMPRESSION / ASSESSMENT AND PLAN / ED COURSE  Pertinent labs & imaging results that were available during my care of the patient were reviewed by me and considered in my medical  decision making (see chart for details).   Patient presented to the emergency department because of concern for possible worsening infection to right breast. Had been seen by surgery yesterday with needle aspiration. Discussed with Dr. Lysle Pearl who will come evaluate the patient. [place] is concerning for possible infected seroma.   ____________________________________________   FINAL CLINICAL IMPRESSION(S) / ED DIAGNOSES  Final diagnoses:  Abscess of right breast     Note: This dictation was prepared with Dragon dictation. Any transcriptional errors that result from this process are unintentional     Nance Pear, MD 08/14/18 1659

## 2018-08-14 NOTE — ED Notes (Signed)
Pt leaving for ultrasound.

## 2018-08-14 NOTE — ED Notes (Signed)
Pt suddenly itchy, hot, has redness at top of forehead, and IV site vessel streaked red up to mid forearm.

## 2018-08-14 NOTE — ED Notes (Signed)
Attempted report x1. 

## 2018-08-15 DIAGNOSIS — N611 Abscess of the breast and nipple: Secondary | ICD-10-CM

## 2018-08-15 LAB — BASIC METABOLIC PANEL
Anion gap: 7 (ref 5–15)
BUN: 8 mg/dL (ref 8–23)
CO2: 35 mmol/L — ABNORMAL HIGH (ref 22–32)
Calcium: 8.8 mg/dL — ABNORMAL LOW (ref 8.9–10.3)
Chloride: 99 mmol/L (ref 98–111)
Creatinine, Ser: 0.52 mg/dL (ref 0.44–1.00)
GFR calc Af Amer: >60 mL/min (ref >60)
Glucose, Bld: 119 mg/dL — ABNORMAL HIGH (ref 70–99)
Potassium: 3.6 mmol/L (ref 3.5–5.1)
SODIUM: 141 mmol/L (ref 135–145)

## 2018-08-15 LAB — CBC
HCT: 37 % (ref 36.0–46.0)
Hemoglobin: 11.5 g/dL — ABNORMAL LOW (ref 12.0–15.0)
MCH: 27.7 pg (ref 26.0–34.0)
MCHC: 31.1 g/dL (ref 30.0–36.0)
MCV: 89.2 fL (ref 80.0–100.0)
Platelets: 230 10*3/uL (ref 150–400)
RBC: 4.15 MIL/uL (ref 3.87–5.11)
RDW: 13.4 % (ref 11.5–15.5)
WBC: 7.9 10*3/uL (ref 4.0–10.5)
nRBC: 0 % (ref 0.0–0.2)

## 2018-08-15 LAB — MAGNESIUM: Magnesium: 2.3 mg/dL (ref 1.7–2.4)

## 2018-08-15 LAB — PHOSPHORUS: Phosphorus: 4.2 mg/dL (ref 2.5–4.6)

## 2018-08-15 MED ORDER — SODIUM CHLORIDE 0.9 % IV SOLN
1.0000 g | INTRAVENOUS | Status: DC
  Administered 2018-08-15 – 2018-08-16 (×2): 1 g via INTRAVENOUS
  Filled 2018-08-15 (×2): qty 1
  Filled 2018-08-15: qty 10

## 2018-08-15 MED ORDER — DIPHENHYDRAMINE HCL 50 MG/ML IJ SOLN
25.0000 mg | Freq: Once | INTRAMUSCULAR | Status: DC
  Filled 2018-08-15: qty 1

## 2018-08-15 NOTE — H&P (Signed)
Subjective:   CC: right breast cellulitis  HPI:  Amy Woodard is a 77 y.o. female who presents with worsening cellulitis from previous visit as noted below.  No systemic symptoms but is complaining of increasing pain and warmth in the area in addition to the erythema.   77 year old female scheduled for Port-A-Cath insertion day before admission.  Upon arrival to the preop area patient stated that she had some episodes of confusion as well as a fever last night along with increasing erythema in her right breast.  She underwent right partial mastectomy a couple months ago.  She did not complain of any issues with the incision site until a day or 2 ago.  Upon inspection it was noted that she had blanching cellulitis throughout the right breast mostly centered around the former incision site.  Had ultrasound performed by myself did not note a fluid collection consistent with possible abscess.  After discussion with patient and caretaker, it was decided that we will postpone the Port-A-Cath insertion today and attempt a bedside needle aspiration of the presumed right breast abscess.  We will reschedule her port placement for the following Tuesday in hopes that by then the cellulitis and abscess will be resolved and we can proceed safely.  Patient and caretaker verbalized understanding and gave verbal consent to proceed with a needle aspiration at bedside, which was uneventful.  Sent home on antibiotics.   Past Medical History:  has a past medical history of Allergy, Arthritis, Breast cancer ([place]) (05/2018), Cellulitis of breast (05/2018), COPD (chronic obstructive pulmonary disease) ([place]), Diabetes mellitus without complication ([place]), Dyspnea, GERD (gastroesophageal reflux disease), History of kidney stones, Hypothyroidism, and Oxygen dependent.  Past Surgical History:  has a past surgical history that includes Appendectomy; Partial mastectomy with needle localization and axillary sentinel lymph node bx  (Right, 04/16/2018); Back surgery; Breast biopsy (Right, 03/19/2018); Breast biopsy (Left, 06/01/2018); Breast biopsy (Right, 06/01/2018); Breast lumpectomy (Right, 04/16/2018); Partial mastectomy with needle localization and axillary sentinel lymph node bx (Left, 07/03/2018); Breast lumpectomy (Right, 07/03/2018); and Irrigation and debridement hematoma (Left, 07/10/2018).  Family History: family history includes Alcohol abuse in her father; Cervical cancer in her other; Clotting disorder in her mother; Lung cancer in her sister; Melanoma in her mother.  Social History:  reports that she quit smoking about 25 years ago. Her smoking use included cigarettes. She has never used smokeless tobacco. She reports that she does not drink alcohol or use drugs.  Current Medications:  Medications Prior to Admission  Medication Sig Dispense Refill  . acetaminophen (TYLENOL) 650 MG CR tablet Take 1 tablet (650 mg total) by mouth every 8 (eight) hours as needed for pain. 30 tablet 0  . amoxicillin-clavulanate (AUGMENTIN) 875-125 MG tablet Take 1 tablet by mouth 2 (two) times daily for 7 days. 14 tablet 0  . azelastine (ASTELIN) 0.1 % nasal spray Place 1 spray into both nostrils 2 (two) times daily.     . budesonide-formoterol (SYMBICORT) 160-4.5 MCG/ACT inhaler Inhale 2 puffs into the lungs 2 (two) times daily.     . folic acid (FOLVITE) 1 MG tablet Take 1 mg by mouth daily.    [name] glipiZIDE (GLUCOTROL) 5 MG tablet Take 5 mg by mouth daily before breakfast.    . hydroxychloroquine (PLAQUENIL) 200 MG tablet Take 400 mg by mouth 2 (two) times daily.     [name] levothyroxine (SYNTHROID, LEVOTHROID) 50 MCG tablet Take 50 mcg by mouth daily before breakfast.    . omeprazole (PRILOSEC) 20 MG  capsule Take 20 mg by mouth daily.      Allergies:  Allergies  Allergen Reactions  . Sulfa Antibiotics Itching    ROS:  General: Denies weight loss, weight gain, fatigue, fevers, chills, and night sweats. Eyes: Denies blurry  vision, double vision, eye pain, itchy eyes, and tearing. Ears: Denies hearing loss, earache, and ringing in ears. Nose: Denies sinus pain, congestion, infections, runny nose, and nosebleeds. Mouth/throat: Denies hoarseness, sore throat, bleeding gums, and difficulty swallowing. Heart: Denies chest pain, palpitations, racing heart, irregular heartbeat, leg pain or swelling, and decreased activity tolerance. Respiratory: Denies breathing difficulty, shortness of breath, wheezing, cough, and sputum. GI: Denies change in appetite, heartburn, nausea, vomiting, constipation, diarrhea, and blood in stool. GU: Denies difficulty urinating, pain with urinating, urgency, frequency, blood in urine, and heavy menstrual bleeding. Musculoskeletal: Denies joint stiffness, pain, swelling, muscle weakness, and pain. Skin: Denies rash, itching, mass, tumors, sores, and boils Neurologic: Denies headache, fainting, dizziness, seizures, numbness, and tingling. Psychiatric: Denies depression, anxiety, difficulty sleeping, and memory loss. Endocrine: Denies heat or cold intolerance, and increased thirst or urination. Blood/lymph: Denies easy bruising, easy bruising, and swollen glands   pertinent positives and negatives noted in HPI   Objective:     BP (!) 112/58 (BP Location: Right Leg)   Pulse 78   Temp 98.2 F (36.8 C) (Oral)   Resp 18   SpO2 99%   Constitutional :  alert, cooperative, appears stated age and no distress  Lymphatics/Throat:  no asymmetry, masses, or scars  Respiratory:  clear to auscultation bilaterally  Cardiovascular:  regular rate and rhythm  Gastrointestinal: soft, non-tender; bowel sounds normal; no masses,  no organomegaly.  Musculoskeletal: Steady gait and movement  Skin: Cool and moist, chaperone present for exam.  Increased erythema and swelling noted in area of concern over right breast incision, noted to extend beyone the previously marked site.  Warm to touch, slightly tender   Psychiatric: Normal affect, non-agitated, not confused       LABS:  CMP Latest Ref Rng & Units 08/15/2018 08/14/2018 07/12/2018  Glucose 70 - 99 mg/dL 119(H) 124(H) 110(H)  BUN 8 - 23 mg/dL 8 11 7(L)  Creatinine 0.44 - 1.00 mg/dL 0.52 0.44 0.46  Sodium 135 - 145 mmol/L 141 137 139  Potassium 3.5 - 5.1 mmol/L 3.6 4.4 4.0  Chloride 98 - 111 mmol/L 99 99 98  CO2 22 - 32 mmol/L 35(H) 30 36(H)  Calcium 8.9 - 10.3 mg/dL 8.8(L) 8.7(L) 8.7(L)  Total Protein 6.5 - 8.1 g/dL - - -  Total Bilirubin 0.3 - 1.2 mg/dL - - -  Alkaline Phos 38 - 126 U/L - - -  AST 15 - 41 U/L - - -  ALT 0 - 44 U/L - - -   CBC Latest Ref Rng & Units 08/15/2018 08/14/2018 07/13/2018  WBC 4.0 - 10.5 K/uL 7.9 10.3 10.5  Hemoglobin 12.0 - 15.0 g/dL 11.5(L) 12.2 10.4(L)  Hematocrit 36.0 - 46.0 % 37.0 39.3 33.5(L)  Platelets 150 - 400 K/uL 230 233 425(H)    RADS: CLINICAL DATA:  Patient has a recent history of bilateral breast cancer. Patient status post lumpectomy of the right breast in November. Patient presents with diffuse redness of the right breast. Ultrasound is requested through the emergency room to assess for abscess.  EXAM: ULTRASOUND OF THE RIGHT BREAST  COMPARISON:  Previous exam(s).  FINDINGS: On physical exam, there is diffuse redness induration of the entire right breast.  Targeted ultrasound is performed,  showing minimal complicated fluid collection at the upper right breast measuring at least 6.6 cm. This is probably a surgical seroma. No other fluid collection is identified throughout right breast.  IMPRESSION: Benign findings.  RECOMMENDATION: Ultrasound findings are consistent most consistent with postsurgical seroma. There is diffuse induration and redness of the entire right breast. Antibiotic treatment is recommended.  I have discussed the findings and recommendations with the patient. Results were also provided in writing at the conclusion of the visit. If applicable, a  reminder letter will be sent to the patient regarding the next appointment.  BI-RADS CATEGORY  2: Benign.   Electronically Signed   By: Abelardo Diesel M.D.   On: 08/14/2018 16:49   Assessment:     right breast cellulitis   Plan:     1. Although labs and [place] reassuring, the cellulitis on clinical exam and patient symptoms worsened since intial exam day prior to admission despite drainage and antibiotics.  Due to upcoming surgery and her complicated medical history, including post operative complications in the past, will admit and start IV abx and monitor.  Will discuss case with ID and heme/onc to see if proceeding with port at this point as orginally scheduled for this Tuesday will be safe or if we should postpone.

## 2018-08-15 NOTE — Consult Note (Signed)
Infectious Disease     Reason for Consult:Cellulitis breast  Referring Physician: Lysle Pearl Date of Admission:  08/14/2018   Active Problems:   Cellulitis of right breast   HPI: Amy Woodard is a 77 y.o. female admitted with redness and pain of R breast.  She has a hx of breast cancer and underwent R partial masttectomy in August without complication (did have surgery as well on the L in Oct complicated by axillary hematoma).  She was due to have port placement for chemotherapy on 12/5 but presented with some redness and pain at the site- also reports fevers and chills that night. Had US showing fluid collection and had bedside aspiration of 35 cc of dark old hematoma like fluid removed. Dced on oral augmentin.  On admit 12/6 no fevrs, wbc 10.3. Culture now with serratia.   Past Medical History:  Diagnosis Date  . Allergy   . Arthritis    rheumatoid arthritis  . Breast cancer ([place]) 05/2018   Right breast found 1st, then left breast with ductal ca  . Cellulitis of breast 05/2018   right breast cellulitis after first biopsy,  antibiotics prescribed  . COPD (chronic obstructive pulmonary disease) ([place])   . Diabetes mellitus without complication ([place])   . Dyspnea   . GERD (gastroesophageal reflux disease)    occasionally  . History of kidney stones    has one but not a problem  . Hypothyroidism   . Oxygen dependent    2liter nasal prong continuously   Past Surgical History:  Procedure Laterality Date  . APPENDECTOMY    . BACK SURGERY     lumbar. herniated disc . no metal  . BREAST BIOPSY Right 03/19/2018   INVASIVE MAMMARY CARCINOMA, NO SPECIAL TYPE  . BREAST BIOPSY Left 06/01/2018   MRI bx, grade III invasive ductal carcinoma  . BREAST BIOPSY Right 06/01/2018   MRI bx of enhancing mass, path showed FOREIGN BODY GRANULOMATOUS RESPONSE   . 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 BREAST LUMPECTOMY Right 07/03/2018   Procedure: RE-EXCISION OF RIGHT BREAST  TISSUE MASS;  Surgeon: Benjamine Sprague, DO;  Location: [facility] ORS;  Service: General;  Laterality: Right;  . IRRIGATION AND DEBRIDEMENT HEMATOMA Left 07/10/2018   Procedure: IRRIGATION AND DEBRIDEMENT HEMATOMA LEFT AXILLARY;  Surgeon: Benjamine Sprague, DO;  Location: [facility] ORS;  Service: General;  Laterality: Left;  . PARTIAL MASTECTOMY WITH NEEDLE LOCALIZATION AND AXILLARY SENTINEL LYMPH NODE BX Right 04/16/2018   Procedure: PARTIAL MASTECTOMY WITH NEEDLE LOCALIZATION AND AXILLARY SENTINEL LYMPH NODE BX;  Surgeon: Benjamine Sprague, DO;  Location: [facility] ORS;  Service: General;  Laterality: Right;  . PARTIAL MASTECTOMY WITH NEEDLE LOCALIZATION AND AXILLARY SENTINEL LYMPH NODE BX Left 07/03/2018   Procedure: PARTIAL MASTECTOMY WITH NEEDLE LOCALIZATION AND SENTINEL LYMPH NODE BX;  Surgeon: Benjamine Sprague, DO;  Location: [facility] ORS;  Service: General;  Laterality: Left;   Social History   Tobacco Use  . Smoking status: Former Smoker    Types: Cigarettes    Last attempt to quit: 03/30/1993    Years since quitting: 25.3  . Smokeless tobacco: Never Used  Substance Use Topics  . Alcohol use: No  . Drug use: Never   Family History  Problem Relation Age of Onset  . Clotting disorder Mother   . Melanoma Mother        deceased 77  . Lung cancer Sister        deceased 74s; smoker  . Alcohol abuse  Father        deceased 38  . Cervical cancer Other        neice    Allergies:  Allergies  Allergen Reactions  . Sulfa Antibiotics Itching    Current antibiotics: Antibiotics Given (last 72 hours)    Date/Time Action Medication Dose Rate   08/14/18 1716 New Bag/Given   vancomycin (VANCOCIN) IVPB 1000 mg/200 mL premix 1,000 mg 200 mL/hr   08/14/18 1844 New Bag/Given   piperacillin-tazobactam (ZOSYN) IVPB 3.375 g 3.375 g 12.5 mL/hr   08/14/18 2217 Given   amoxicillin-clavulanate (AUGMENTIN) 875-125 MG per tablet 1 tablet 1 tablet    08/15/18 0247 New Bag/Given   vancomycin (VANCOCIN) IVPB 750 mg/150 ml premix 750  mg 150 mL/hr   08/15/18 0744 New Bag/Given   piperacillin-tazobactam (ZOSYN) IVPB 3.375 g 3.375 g 12.5 mL/hr   08/15/18 0800 Given   hydroxychloroquine (PLAQUENIL) tablet 800 mg 800 mg       MEDICATIONS: . azelastine  1 spray Each Nare BID  . diphenhydrAMINE  25 mg Intravenous Once  . enoxaparin (LOVENOX) injection  40 mg Subcutaneous Q24H  . folic acid  1 mg Oral Daily  . glipiZIDE  5 mg Oral QAC breakfast  . hydroxychloroquine  800 mg Oral Daily  . levothyroxine  50 mcg Oral Q0600  . mometasone-formoterol  2 puff Inhalation BID  . pantoprazole  40 mg Oral Daily    Review of Systems - 11 systems reviewed and negative per HPI   OBJECTIVE: Temp:  [98 F (36.7 C)-99.4 F (37.4 C)] 98.2 F (36.8 C) (12/07 1206) Pulse Rate:  [78-92] 78 (12/07 1206) Resp:  [18-25] 18 (12/07 1206) BP: (108-179)/(47-103) 112/58 (12/07 1206) SpO2:  [93 %-99 %] 99 % (12/07 1206) Physical Exam  Constitutional:  oriented to person, place, and time.  HENT: [place]/AT, PERRLA, no scleral icterus Mouth/Throat: Oropharynx is clear and moist. No oropharyngeal exudate.  Cardiovascular: Normal rate, regular rhythm and normal heart sounds. Exam reveals no gallop and no friction rub.  Breast exam- R breast with mild induration, and erythema, no draiange. Pulmonary/Chest: Effort normal and breath sounds normal. No respiratory distress.  has no wheezes.  Neck = supple, no nuchal rigidity Abdominal: Soft. Bowel sounds are normal.  exhibits no distension. There is no tenderness.  Lymphadenopathy: no cervical adenopathy. No axillary adenopathy Neurological: alert and oriented to person, place, and time.  Skin: Skin is warm and dry. No rash noted. No erythema.  Psychiatric: a normal mood and affect.  behavior is normal.    LABS: Results for orders placed or performed during the hospital encounter of 08/14/18 (from the past 48 hour(s))  CBC with Differential     Status: None   Collection Time: 08/14/18  3:14 PM   Result Value Ref Range   WBC 10.3 4.0 - 10.5 K/uL   RBC 4.39 3.87 - 5.11 MIL/uL   Hemoglobin 12.2 12.0 - 15.0 g/dL   HCT 39.3 36.0 - 46.0 %   MCV 89.5 80.0 - 100.0 fL   MCH 27.8 26.0 - 34.0 pg   MCHC 31.0 30.0 - 36.0 g/dL   RDW 13.5 11.5 - 15.5 %   Platelets 233 150 - 400 K/uL   nRBC 0.0 0.0 - 0.2 %   Neutrophils Relative % 72 %   Neutro Abs 7.4 1.7 - 7.7 K/uL   Lymphocytes Relative 16 %   Lymphs Abs 1.7 0.7 - 4.0 K/uL   Monocytes Relative 7 %   Monocytes Absolute  0.7 0.1 - 1.0 K/uL   Eosinophils Relative 4 %   Eosinophils Absolute 0.4 0.0 - 0.5 K/uL   Basophils Relative 1 %   Basophils Absolute 0.1 0.0 - 0.1 K/uL   Immature Granulocytes 0 %   Abs Immature Granulocytes 0.03 0.00 - 0.07 K/uL    Comment: Performed at [facility], [address], [place], [place] [address]  Basic metabolic panel     Status: Abnormal   Collection Time: 08/14/18  3:14 PM  Result Value Ref Range   Sodium 137 135 - 145 mmol/L   Potassium 4.4 3.5 - 5.1 mmol/L    Comment: HEMOLYSIS AT THIS LEVEL MAY AFFECT RESULT   Chloride 99 98 - 111 mmol/L   CO2 30 22 - 32 mmol/L   Glucose, Bld 124 (H) 70 - 99 mg/dL   BUN 11 8 - 23 mg/dL   Creatinine, Ser 0.44 0.44 - 1.00 mg/dL   Calcium 8.7 (L) 8.9 - 10.3 mg/dL   GFR calc non Af Amer >60 >60 mL/min   GFR calc Af Amer >60 >60 mL/min   Anion gap 8 5 - 15    Comment: Performed at [facility], [address], [place], [place] [address]  Basic metabolic panel     Status: Abnormal   Collection Time: 08/15/18  6:30 AM  Result Value Ref Range   Sodium 141 135 - 145 mmol/L   Potassium 3.6 3.5 - 5.1 mmol/L   Chloride 99 98 - 111 mmol/L   CO2 35 (H) 22 - 32 mmol/L   Glucose, Bld 119 (H) 70 - 99 mg/dL   BUN 8 8 - 23 mg/dL   Creatinine, Ser 0.52 0.44 - 1.00 mg/dL   Calcium 8.8 (L) 8.9 - 10.3 mg/dL   GFR calc non Af Amer >60 >60 mL/min   GFR calc Af Amer >60 >60 mL/min   Anion gap 7 5 - 15    Comment: Performed at [facility], [address] [place], [place] [address]  Magnesium     Status: None   Collection Time: 08/15/18  6:30 AM  Result Value Ref Range   Magnesium 2.3 1.7 - 2.4 mg/dL    Comment: Performed at [facility], [address] [place], [place] [address]  Phosphorus     Status: None   Collection Time: 08/15/18  6:30 AM  Result Value Ref Range   Phosphorus 4.2 2.5 - 4.6 mg/dL    Comment: Performed at [facility], [address] [place], [place] [address]  CBC     Status: Abnormal   Collection Time: 08/15/18  6:30 AM  Result Value Ref Range   WBC 7.9 4.0 - 10.5 K/uL   RBC 4.15 3.87 - 5.11 MIL/uL   Hemoglobin 11.5 (L) 12.0 - 15.0 g/dL   HCT 37.0 36.0 - 46.0 %   MCV 89.2 80.0 - 100.0 fL   MCH 27.7 26.0 - 34.0 pg   MCHC 31.1 30.0 - 36.0 g/dL   RDW 13.4 11.5 - 15.5 %   Platelets 230 150 - 400 K/uL   nRBC 0.0 0.0 - 0.2 %    Comment: Performed at [facility], [address] [place], [place] [address]   No components found for: ESR, C REACTIVE PROTEIN MICRO: Recent Results (from the past 720 hour(s))  Aerobic/Anaerobic Culture (surgical/deep wound)     Status: None (Preliminary result)   Collection Time: 08/13/18  9:34 AM  Result Value Ref Range  Status   Specimen Description   Final    ABSCESS BREAST RIGHT Performed at [facility] [address], [place], [place] [address]    Special Requests   Final    Normal Performed at [facility], [address] [place], [place] [address]    Gram Stain   Final    FEW WBC PRESENT, PREDOMINANTLY PMN NO ORGANISMS SEEN Performed at [facility], [place] [address], [place], [place] [address]    Culture FEW SERRATIA MARCESCENS  Final   Report Status PENDING  Incomplete   Organism ID, Bacteria SERRATIA MARCESCENS  Final      Susceptibility   Serratia marcescens - MIC*    CEFAZOLIN >=64 RESISTANT Resistant     CEFEPIME <=1 SENSITIVE Sensitive     CEFTAZIDIME <=1 SENSITIVE Sensitive     CEFTRIAXONE  <=1 SENSITIVE Sensitive     CIPROFLOXACIN <=0.25 SENSITIVE Sensitive     GENTAMICIN <=1 SENSITIVE Sensitive     TRIMETH/SULFA <=20 SENSITIVE Sensitive     * FEW SERRATIA MARCESCENS    IMAGING: US Breast Ltd Uni Right Inc Axilla  Result Date: 08/14/2018 CLINICAL DATA:  Patient has a recent history of bilateral breast cancer. Patient status post lumpectomy of the right breast in November. Patient presents with diffuse redness of the right breast. Ultrasound is requested through the emergency room to assess for abscess. EXAM: ULTRASOUND OF THE RIGHT BREAST COMPARISON:  Previous exam(s). FINDINGS: On physical exam, there is diffuse redness induration of the entire right breast. Targeted ultrasound is performed, showing minimal complicated fluid collection at the upper right breast measuring at least 6.6 cm. This is probably a surgical seroma. No other fluid collection is identified throughout right breast. IMPRESSION: Benign findings. RECOMMENDATION: Ultrasound findings are consistent most consistent with postsurgical seroma. There is diffuse induration and redness of the entire right breast. Antibiotic treatment is recommended. I have discussed the findings and recommendations with the patient. Results were also provided in writing at the conclusion of the visit. If applicable, a reminder letter will be sent to the patient regarding the next appointment. BI-RADS CATEGORY  2: Benign. Electronically Signed   By: Abelardo Diesel M.D.   On: 08/14/2018 16:49    Assessment:   SABEL HORNBECK is a 77 y.o. female with R breast cellulitis and small abscess sp drainage. She had surgery on the breast in August without incident. Culture with serratia.  Needs port for systemic chemo.   Serratia is an unusual organism to cause a cellulitis so some concern about just narrowing to cover it alone without also covering the usual causes of cellulitis such as staph ad strep. Discussed with her  that she will need to have  significant improvement prior to port placement. However there is some urgency to start chemo so would not delay too long.  Post procedure [place] confirmed resolution of the abscess.  Recommendations DC vanco and zosyn Start ceftriaxone However can dc when stable on oral bactrim 1 DS BID to cover the serratia and keflex 500 bid  to cover staph strep and other organisms.  Would suggest delay port placement until later this coming week with assessment prior to placement to ensure continued improvement. Thank you very much for allowing me to participate in the care of this patient. Please call with questions.   Cheral Marker. Ola Spurr, MD

## 2018-08-16 LAB — CBC
HCT: 36.9 % (ref 36.0–46.0)
Hemoglobin: 11.2 g/dL — ABNORMAL LOW (ref 12.0–15.0)
MCH: 27.2 pg (ref 26.0–34.0)
MCHC: 30.4 g/dL (ref 30.0–36.0)
MCV: 89.6 fL (ref 80.0–100.0)
NRBC: 0 % (ref 0.0–0.2)
Platelets: 241 10*3/uL (ref 150–400)
RBC: 4.12 MIL/uL (ref 3.87–5.11)
RDW: 13.3 % (ref 11.5–15.5)
WBC: 7.2 10*3/uL (ref 4.0–10.5)

## 2018-08-16 LAB — BASIC METABOLIC PANEL
Anion gap: 9 (ref 5–15)
BUN: 6 mg/dL — ABNORMAL LOW (ref 8–23)
CO2: 33 mmol/L — ABNORMAL HIGH (ref 22–32)
Calcium: 8.8 mg/dL — ABNORMAL LOW (ref 8.9–10.3)
Chloride: 100 mmol/L (ref 98–111)
Creatinine, Ser: 0.46 mg/dL (ref 0.44–1.00)
GFR calc Af Amer: >60 mL/min (ref >60)
GFR calc non Af Amer: >60 mL/min (ref >60)
Glucose, Bld: 140 mg/dL — ABNORMAL HIGH (ref 70–99)
POTASSIUM: 3.8 mmol/L (ref 3.5–5.1)
Sodium: 142 mmol/L (ref 135–145)

## 2018-08-16 LAB — MAGNESIUM: Magnesium: 2.4 mg/dL (ref 1.7–2.4)

## 2018-08-16 LAB — PHOSPHORUS: Phosphorus: 3.6 mg/dL (ref 2.5–4.6)

## 2018-08-16 MED ORDER — NYSTATIN 100000 UNIT/ML MT SUSP
5.0000 mL | Freq: Four times a day (QID) | OROMUCOSAL | Status: DC
  Administered 2018-08-16 – 2018-08-17 (×5): 500000 [IU] via ORAL
  Filled 2018-08-16 (×5): qty 5

## 2018-08-16 NOTE — Progress Notes (Signed)
Subjective:  CC:  Amy Woodard is a 77 y.o. female  Hospital stay day 2,   right breast cellulitis  HPI: No issues overnight.  Still sore in the area.  ROS:  A 5 point review of systems was performed and pertinent positives and negatives noted in HPI.   Objective:      Temp:  [98.2 F (36.8 C)-98.6 F (37 C)] 98.5 F (36.9 C) (12/08 0440) Pulse Rate:  [78-86] 80 (12/08 0440) Resp:  [18-20] 18 (12/08 0440) BP: (112-144)/(50-58) 144/52 (12/08 0440) SpO2:  [98 %-99 %] 98 % (12/08 0440) Weight:  [70.6 kg] 70.6 kg (12/07 1800)     Height: 5\' 9"  (175.3 cm) Weight: 70.6 kg BMI (Calculated): 22.97   Intake/Output this shift:   Intake/Output Summary (Last 24 hours) at 08/16/2018 1005 Last data filed at 08/16/2018 0948 Gross per 24 hour  Intake 458.6 ml  Output 700 ml  Net -241.4 ml     Constitutional :  alert, cooperative, appears stated age and no distress  Skin: Cool and moist. Right breast erythema resolved, but still has generalized tenderness and some swelling in area.  Psychiatric: Normal affect, non-agitated, not confused       LABS:  CMP Latest Ref Rng & Units 08/16/2018 08/15/2018 08/14/2018  Glucose 70 - 99 mg/dL 140(H) 119(H) 124(H)  BUN 8 - 23 mg/dL 6(L) 8 11  Creatinine 0.44 - 1.00 mg/dL 0.46 0.52 0.44  Sodium 135 - 145 mmol/L 142 141 137  Potassium 3.5 - 5.1 mmol/L 3.8 3.6 4.4  Chloride 98 - 111 mmol/L 100 99 99  CO2 22 - 32 mmol/L 33(H) 35(H) 30  Calcium 8.9 - 10.3 mg/dL 8.8(L) 8.8(L) 8.7(L)  Total Protein 6.5 - 8.1 g/dL - - -  Total Bilirubin 0.3 - 1.2 mg/dL - - -  Alkaline Phos 38 - 126 U/L - - -  AST 15 - 41 U/L - - -  ALT 0 - 44 U/L - - -   CBC Latest Ref Rng & Units 08/16/2018 08/15/2018 08/14/2018  WBC 4.0 - 10.5 K/uL 7.2 7.9 10.3  Hemoglobin 12.0 - 15.0 g/dL 11.2(L) 11.5(L) 12.2  Hematocrit 36.0 - 46.0 % 36.9 37.0 39.3  Platelets 150 - 400 K/uL 241 230 233    RADS: n/a Assessment:   Right breast celllulitis, looking better every day, but  still sore and symptomatic so will continue another day of IV antibiotics.  More than likely postpone port placement until completion of antibiotics due to her complicated history.  Onc ok with postponing chemo until then.  Appreciate ID consult

## 2018-08-16 NOTE — Progress Notes (Signed)
[facility] INFECTIOUS DISEASE PROGRESS NOTE Date of Admission:  08/14/2018     ID: Amy Woodard is a 77 y.o. female with R breast cellulitis and abscess, several months s/p breast surgery.  Also needs Port placed on Right Active Problems:   Cellulitis of right breast   Abscess of right breast   Subjective: No fevers, still some ttp at breast site.  ROS  Eleven systems are reviewed and negative except per hpi  Medications:  Antibiotics Given (last 72 hours)    Date/Time Action Medication Dose Rate   08/14/18 1716 New Bag/Given   vancomycin (VANCOCIN) IVPB 1000 mg/200 mL premix 1,000 mg 200 mL/hr   08/14/18 1844 New Bag/Given   piperacillin-tazobactam (ZOSYN) IVPB 3.375 g 3.375 g 12.5 mL/hr   08/14/18 2217 Given   amoxicillin-clavulanate (AUGMENTIN) 875-125 MG per tablet 1 tablet 1 tablet    08/15/18 0247 New Bag/Given   vancomycin (VANCOCIN) IVPB 750 mg/150 ml premix 750 mg 150 mL/hr   08/15/18 0744 New Bag/Given   piperacillin-tazobactam (ZOSYN) IVPB 3.375 g 3.375 g 12.5 mL/hr   08/15/18 0800 Given   hydroxychloroquine (PLAQUENIL) tablet 800 mg 800 mg    08/15/18 1306 New Bag/Given   piperacillin-tazobactam (ZOSYN) IVPB 3.375 g 3.375 g 12.5 mL/hr   08/15/18 1308 New Bag/Given   vancomycin (VANCOCIN) IVPB 750 mg/150 ml premix 750 mg 150 mL/hr   08/15/18 1818 New Bag/Given   cefTRIAXone (ROCEPHIN) 1 g in sodium chloride 0.9 % 100 mL IVPB 1 g 200 mL/hr   08/16/18 0829 Given   hydroxychloroquine (PLAQUENIL) tablet 800 mg 800 mg      . azelastine  1 spray Each Nare BID  . diphenhydrAMINE  25 mg Intravenous Once  . enoxaparin (LOVENOX) injection  40 mg Subcutaneous Q24H  . folic acid  1 mg Oral Daily  . glipiZIDE  5 mg Oral QAC breakfast  . hydroxychloroquine  800 mg Oral Daily  . levothyroxine  50 mcg Oral Q0600  . mometasone-formoterol  2 puff Inhalation BID  . nystatin  5 mL Oral QID  . pantoprazole  40 mg Oral Daily    Objective: Vital signs in last 24  hours: Temp:  [98.5 F (36.9 C)-98.6 F (37 C)] 98.5 F (36.9 C) (12/08 0440) Pulse Rate:  [80-86] 80 (12/08 0440) Resp:  [18-20] 18 (12/08 0440) BP: (133-144)/(50-52) 144/52 (12/08 0440) SpO2:  [98 %] 98 % (12/08 0440) Weight:  [70.6 kg] 70.6 kg (12/07 1800) Constitutional:  oriented to person, place, and time.  HENT: [place]/AT, PERRLA, no scleral icterus Mouth/Throat: Oropharynx is clear and moist. No oropharyngeal exudate.  Cardiovascular: Normal rate, regular rhythm and normal heart sounds. Exam reveals no gallop and no friction rub.  Breast exam- R breast with mild induration, and erythema, no draiange. Pulmonary/Chest: Effort normal and breath sounds normal. No respiratory distress.  has no wheezes.  Neck = supple, no nuchal rigidity Abdominal: Soft. Bowel sounds are normal.  exhibits no distension. There is no tenderness.  Lymphadenopathy: no cervical adenopathy. No axillary adenopathy Neurological: alert and oriented to person, place, and time.  Skin: Skin is warm and dry. No rash noted. No erythema.  Psychiatric: a normal mood and affect Lab Results Recent Labs    08/15/18 0630 08/16/18 0553  WBC 7.9 7.2  HGB 11.5* 11.2*  HCT 37.0 36.9  NA 141 142  K 3.6 3.8  CL 99 100  CO2 35* 33*  BUN 8 6*  CREATININE 0.52 0.46    Microbiology: Results  for orders placed or performed during the hospital encounter of 08/13/18  Aerobic/Anaerobic Culture (surgical/deep wound)     Status: None (Preliminary result)   Collection Time: 08/13/18  9:34 AM  Result Value Ref Range Status   Specimen Description   Final    ABSCESS BREAST RIGHT Performed at [facility], [place] [address] [place], [place] [address]    Special Requests   Final    Normal Performed at [facility], [address] [place], [place] [address]    Gram Stain   Final    FEW WBC PRESENT, PREDOMINANTLY PMN NO ORGANISMS SEEN Performed at [facility], [place] [address], [place], [place] [address]     Culture   Final    FEW SERRATIA MARCESCENS NO ANAEROBES ISOLATED; CULTURE IN PROGRESS FOR 5 DAYS    Report Status PENDING  Incomplete   Organism ID, Bacteria SERRATIA MARCESCENS  Final      Susceptibility   Serratia marcescens - MIC*    CEFAZOLIN >=64 RESISTANT Resistant     CEFEPIME <=1 SENSITIVE Sensitive     CEFTAZIDIME <=1 SENSITIVE Sensitive     CEFTRIAXONE <=1 SENSITIVE Sensitive     CIPROFLOXACIN <=0.25 SENSITIVE Sensitive     GENTAMICIN <=1 SENSITIVE Sensitive     TRIMETH/SULFA <=20 SENSITIVE Sensitive     * FEW SERRATIA MARCESCENS    Studies/Results: US Breast Ltd Uni Right Inc Axilla  Result Date: 08/14/2018 CLINICAL DATA:  Patient has a recent history of bilateral breast cancer. Patient status post lumpectomy of the right breast in November. Patient presents with diffuse redness of the right breast. Ultrasound is requested through the emergency room to assess for abscess. EXAM: ULTRASOUND OF THE RIGHT BREAST COMPARISON:  Previous exam(s). FINDINGS: On physical exam, there is diffuse redness induration of the entire right breast. Targeted ultrasound is performed, showing minimal complicated fluid collection at the upper right breast measuring at least 6.6 cm. This is probably a surgical seroma. No other fluid collection is identified throughout right breast. IMPRESSION: Benign findings. RECOMMENDATION: Ultrasound findings are consistent most consistent with postsurgical seroma. There is diffuse induration and redness of the entire right breast. Antibiotic treatment is recommended. I have discussed the findings and recommendations with the patient. Results were also provided in writing at the conclusion of the visit. If applicable, a reminder letter will be sent to the patient regarding the next appointment. BI-RADS CATEGORY  2: Benign. Electronically Signed   By: Abelardo Diesel M.D.   On: 08/14/2018 16:49    Assessment/Plan: Amy Woodard is a 77 y.o. female with R breast  cellulitis and small abscess sp drainage. She had surgery on the breast in August without incident. Culture with serratia.  Needs port for systemic chemo.   Serratia is an unusual organism to cause a cellulitis so some concern about just narrowing to cover it alone without also covering the usual causes of cellulitis such as staph ad strep. Discussed with her  that she will need to have significant improvement prior to port placement. However there is some urgency to start chemo so would not delay too long.  Post procedure [place] confirmed resolution of the abscess.  Recommendations DAY 3 of hospitalization. Cont ceftriaxone while inpatient. However can dc when stable on oral ciprofloxacin 500 bid (pt is allergic to Bactrim) to cover the serratia and keflex 500 bid  to cover staph/strep organisms.  Would plan on 14 day total treatment course.   Would suggest delay  port placement until later this coming week with assessment prior to placement to ensure continued improvement.  Could do [place] to ensure no re-accumulation of the fluid collection prior to port placement.  I do not think she needs to have completed the full 14 day course of therapy prior to port placement but would suggest at least 7 days taken prior to placement.  Thank you very much for the consult. Will follow with you.  Leonel Ramsay   08/16/2018, 12:56 PM

## 2018-08-17 LAB — PHOSPHORUS: PHOSPHORUS: 4.1 mg/dL (ref 2.5–4.6)

## 2018-08-17 LAB — CBC
HCT: 39.2 % (ref 36.0–46.0)
Hemoglobin: 11.6 g/dL — ABNORMAL LOW (ref 12.0–15.0)
MCH: 27.6 pg (ref 26.0–34.0)
MCHC: 29.6 g/dL — ABNORMAL LOW (ref 30.0–36.0)
MCV: 93.1 fL (ref 80.0–100.0)
PLATELETS: 235 10*3/uL (ref 150–400)
RBC: 4.21 MIL/uL (ref 3.87–5.11)
RDW: 13.2 % (ref 11.5–15.5)
WBC: 6.7 10*3/uL (ref 4.0–10.5)
nRBC: 0 % (ref 0.0–0.2)

## 2018-08-17 LAB — BASIC METABOLIC PANEL
Anion gap: 8 (ref 5–15)
BUN: 6 mg/dL — ABNORMAL LOW (ref 8–23)
CO2: 31 mmol/L (ref 22–32)
Calcium: 8.8 mg/dL — ABNORMAL LOW (ref 8.9–10.3)
Chloride: 102 mmol/L (ref 98–111)
Creatinine, Ser: 0.44 mg/dL (ref 0.44–1.00)
GFR calc non Af Amer: >60 mL/min (ref >60)
Glucose, Bld: 122 mg/dL — ABNORMAL HIGH (ref 70–99)
Potassium: 4.1 mmol/L (ref 3.5–5.1)
SODIUM: 141 mmol/L (ref 135–145)

## 2018-08-17 LAB — MAGNESIUM: Magnesium: 2.4 mg/dL (ref 1.7–2.4)

## 2018-08-17 MED ORDER — FLUCONAZOLE 150 MG PO TABS: 150.0000 mg | ORAL_TABLET | Freq: Once | ORAL | 0 refills | Status: DC

## 2018-08-17 MED ORDER — CIPROFLOXACIN HCL 500 MG PO TABS: 500.0000 mg | ORAL_TABLET | Freq: Two times a day (BID) | ORAL | 0 refills | Status: DC

## 2018-08-17 MED ORDER — CEPHALEXIN 500 MG PO CAPS: 500.0000 mg | ORAL_CAPSULE | Freq: Two times a day (BID) | ORAL | 0 refills | Status: DC

## 2018-08-17 MED ORDER — MICONAZOLE NITRATE 2 % VA CREA: 1.0000 | TOPICAL_CREAM | Freq: Every day | VAGINAL | 0 refills | Status: DC

## 2018-08-17 NOTE — Progress Notes (Signed)
[facility] call nursing station to notified pt had been DC with Cipro. They stated cipro can prolong her QT interval and cause pt to develop a-flutter. [facility] provided [facility] with Dr. Ines Bloomer number, so they can  contact him. Also RN notified Dr. Lysle Pearl.

## 2018-08-17 NOTE — Progress Notes (Signed)
[name]  A and O x 4. VSS. Pt tolerating diet well. No complaints of pain or nausea. IV removed intact, prescriptions given. Pt voiced understanding of discharge instructions with no further questions. Pt discharged via wheelchair with RN.    Allergies as of 08/17/2018      Reactions   Sulfa Antibiotics Itching      Medication List    STOP taking these medications   amoxicillin-clavulanate 875-125 MG tablet Commonly known as:  AUGMENTIN     TAKE these medications   acetaminophen 650 MG CR tablet Commonly known as:  TYLENOL Take 1 tablet (650 mg total) by mouth every 8 (eight) hours as needed for pain.   azelastine 0.1 % nasal spray Commonly known as:  ASTELIN Place 1 spray into both nostrils 2 (two) times daily.   cephALEXin 500 MG capsule Commonly known as:  KEFLEX Take 1 capsule (500 mg total) by mouth 2 (two) times daily for 12 days.   ciprofloxacin 500 MG tablet Commonly known as:  CIPRO Take 1 tablet (500 mg total) by mouth 2 (two) times daily for 12 days.   folic acid 1 MG tablet Commonly known as:  FOLVITE Take 1 mg by mouth daily.   glipiZIDE 5 MG tablet Commonly known as:  GLUCOTROL Take 5 mg by mouth daily before breakfast.   hydroxychloroquine 200 MG tablet Commonly known as:  PLAQUENIL Take 400 mg by mouth 2 (two) times daily.   levothyroxine 50 MCG tablet Commonly known as:  SYNTHROID, LEVOTHROID Take 50 mcg by mouth daily before breakfast.   miconazole 2 % vaginal cream Commonly known as:  MONISTAT 7 Place 1 Applicatorful vaginally at bedtime.   omeprazole 20 MG capsule Commonly known as:  PRILOSEC Take 20 mg by mouth daily.   SYMBICORT 160-4.5 MCG/ACT inhaler Generic drug:  budesonide-formoterol Inhale 2 puffs into the lungs 2 (two) times daily.       Vitals:   08/17/18 0451 08/17/18 1158  BP: (!) 129/50 (!) 150/92  Pulse: 79 88  Resp: 18 18  Temp: 97.6 F (36.4 C) 98.3 F (36.8 C)  SpO2: 98% 97%    Francesco Sor

## 2018-08-17 NOTE — Discharge Summary (Addendum)
Physician Discharge Summary  Patient ID: Amy Woodard MRN: [id number] DOB/AGE: 1940/10/27 77 y.o.  Admit date: 08/14/2018 Discharge date: 08/17/2018  Admission Diagnoses: right breast cellulitis  Discharge Diagnoses:  Same as above  Discharged Condition: good  Hospital Course: Admitted for breast cellulitis that worsened despite outpatient drainage of underlying fluid collection and oral abx.  Complete resolution noted with IV abx and pt comfortable to be d/c'd.  ID consult placed and recommend abx course as noted in meds below.  Courtesy call to oncology made re: delaying start of chemotherapy and she states that will not be a problem.  Plan is to proceed with port placement next week per ID recs.  Consults: ID  Discharge Exam: Blood pressure (!) 129/50, pulse 79, temperature 97.6 F (36.4 C), temperature source Oral, resp. rate 18, height 5\' 9"  (1.753 m), weight 70.6 kg, SpO2 98 %. General appearance: alert, cooperative and no distress Breasts: normal appearance, no masses or tenderness, erythema has not completely resolved  Disposition:  Discharge disposition: 01-Home or Self Care       Discharge Instructions    Discharge patient   Complete by:  As directed    Discharge disposition:  01-Home or Self Care   Discharge patient date:  08/17/2018     Allergies as of 08/17/2018      Reactions   Sulfa Antibiotics Itching      Medication List    STOP taking these medications   amoxicillin-clavulanate 875-125 MG tablet Commonly known as:  AUGMENTIN     TAKE these medications   acetaminophen 650 MG CR tablet Commonly known as:  TYLENOL Take 1 tablet (650 mg total) by mouth every 8 (eight) hours as needed for pain.   azelastine 0.1 % nasal spray Commonly known as:  ASTELIN Place 1 spray into both nostrils 2 (two) times daily.   cephALEXin 500 MG capsule Commonly known as:  KEFLEX Take 1 capsule (500 mg total) by mouth 2 (two) times daily for 12 days.    ciprofloxacin 500 MG tablet Commonly known as:  CIPRO Take 1 tablet (500 mg total) by mouth 2 (two) times daily for 12 days.   fluconazole 150 MG tablet Commonly known as:  DIFLUCAN Take 1 tablet (150 mg total) by mouth once for 1 dose. For oral and vaginal yeast infection   folic acid 1 MG tablet Commonly known as:  FOLVITE Take 1 mg by mouth daily.   glipiZIDE 5 MG tablet Commonly known as:  GLUCOTROL Take 5 mg by mouth daily before breakfast.   hydroxychloroquine 200 MG tablet Commonly known as:  PLAQUENIL Take 400 mg by mouth 2 (two) times daily.   levothyroxine 50 MCG tablet Commonly known as:  SYNTHROID, LEVOTHROID Take 50 mcg by mouth daily before breakfast.   omeprazole 20 MG capsule Commonly known as:  PRILOSEC Take 20 mg by mouth daily.   SYMBICORT 160-4.5 MCG/ACT inhaler Generic drug:  budesonide-formoterol Inhale 2 puffs into the lungs 2 (two) times daily.      Follow-up Information    Benjamine Sprague, DO Follow up on 08/24/2018.   Specialty:  Surgery Contact information: [place] [address] [phone]            Total time spent arranging discharge was >9min. Signed: Benjamine Sprague 08/17/2018, 11:07 AM

## 2018-08-17 NOTE — Care Management Important Message (Signed)
Copy of signed IM left with patient in room.  

## 2018-08-17 NOTE — Discharge Instructions (Signed)
Cellulitis, Adult Cellulitis is a skin infection. The infected area is usually red and sore. This condition occurs most often in the arms and lower legs. It is very important to get treated for this condition. Follow these instructions at home:  Take over-the-counter and prescription medicines only as told by your doctor.  If you were prescribed an antibiotic medicine, take it as told by your doctor. Do not stop taking the antibiotic even if you start to feel better.  Drink enough fluid to keep your pee (urine) clear or pale yellow.  Do not touch or rub the infected area.  Raise (elevate) the infected area above the level of your heart while you are sitting or lying down.  Place warm or cold wet cloths (warm or cold compresses) on the infected area. Do this as told by your doctor.  Keep all follow-up visits as told by your doctor. This is important. These visits let your doctor make sure your infection is not getting worse. Contact a doctor if:  You have a fever.  Your symptoms do not get better after 1-2 days of treatment.  Your bone or joint under the infected area starts to hurt after the skin has healed.  Your infection comes back. This can happen in the same area or another area.  You have a swollen bump in the infected area.  You have new symptoms.  You feel ill and also have muscle aches and pains. Get help right away if:  Your symptoms get worse.  You feel very sleepy.  You throw up (vomit) or have watery poop (diarrhea) for a long time.  There are red streaks coming from the infected area.  Your red area gets larger.  Your red area turns darker. This information is not intended to replace advice given to you by your health care provider. Make sure you discuss any questions you have with your health care provider. Document Released: 02/12/2008 Document Revised: 02/01/2016 Document Reviewed: 07/05/2015 Elsevier Interactive Patient Education  2018 [place] ON 08/24/18 FOR FINAL RECHECK BEFORE PROCEEDING WITH PORT PLACEMENT TENTATIVELY SCHEDULED FOR 08/25/18.

## 2018-08-18 LAB — AEROBIC/ANAEROBIC CULTURE (SURGICAL/DEEP WOUND): SPECIAL REQUESTS: NORMAL

## 2018-08-18 LAB — AEROBIC/ANAEROBIC CULTURE W GRAM STAIN (SURGICAL/DEEP WOUND)

## 2018-08-19 ENCOUNTER — Inpatient Hospital Stay: Payer: PPO | Admitting: Oncology

## 2018-08-19 ENCOUNTER — Inpatient Hospital Stay: Payer: PPO

## 2018-08-19 ENCOUNTER — Other Ambulatory Visit: Payer: Self-pay

## 2018-08-19 NOTE — Patient Outreach (Signed)
Amy Woodard) Care Management  Amy Woodard   08/19/2018  Amy Woodard 1941-07-08 [id number]  Reason for referral:  30 day post discharge medication review  Current insurance:[employer]  PMHx:  COPD, type 2 diabetes mellitus, hypothyroidism, rheumatoid arthritis, breast cancer and cellulitis  HPI:  Amy Woodard states that she is feeling well and believes her antibiotics are "working."  She states she saw a little redness this morning around site.  Patient reports that she gets her port inserted on 2/17 for her breast cancer treatment.  She states that she checks her glucose daily and that it is usually between 98-110 mg/dL.  Objective: Lab Results  Component Value Date   CREATININE 0.44 08/17/2018   CREATININE 0.46 08/16/2018   CREATININE 0.52 08/15/2018    No results found for: HGBA1C  Lipid Panel  No results found for: CHOL, TRIG, HDL, CHOLHDL, VLDL, LDLCALC, LDLDIRECT  BP Readings from Last 3 Encounters:  08/17/18 (!) 150/92  08/13/18 (!) 171/83  08/12/18 (!) 133/55    Allergies  Allergen Reactions  . Sulfa Antibiotics Itching    Medications Reviewed Today    Reviewed by Dionne Milo, [facility] (Pharmacist) on 08/19/18 at 1356  Med List Status: <None>  Medication Order Taking? Sig Documenting Provider Last Dose Status Informant  acetaminophen (TYLENOL) 650 MG CR tablet [id number] No Take 1 tablet (650 mg total) by mouth every 8 (eight) hours as needed for pain.  Patient not taking:  Reported on 08/19/2018   Benjamine Sprague, DO Not Taking Active Self  azelastine (ASTELIN) 0.1 % nasal spray [id number] Yes Place 1 spray into both nostrils 2 (two) times daily.  [provider] Taking Active Self  budesonide-formoterol (SYMBICORT) 160-4.5 MCG/ACT inhaler [id number] Yes Inhale 2 puffs into the lungs 2 (two) times daily.  [provider] Taking Active Self  cephALEXin (KEFLEX) 500 MG capsule [id number] Yes Take 1 capsule (500 mg total) by  mouth 2 (two) times daily for 12 days. Benjamine Sprague, DO Taking Active   ciprofloxacin (CIPRO) 500 MG tablet [id number] Yes Take 1 tablet (500 mg total) by mouth 2 (two) times daily for 12 days. Benjamine Sprague, DO Taking Active   folic acid (FOLVITE) 1 MG tablet [id number] Yes Take 1 mg by mouth daily. [provider] Taking Active Self  glipiZIDE (GLUCOTROL) 5 MG tablet [id number] Yes Take 5 mg by mouth daily before breakfast. [provider] Taking Active Self  hydroxychloroquine (PLAQUENIL) 200 MG tablet [id number] Yes Take 400 mg by mouth 2 (two) times daily.  [provider] Taking Active Self           Med Note Darnelle Maffucci, Arville Lime   Wed Aug 12, 2018 11:02 AM) Takes twice daily  levothyroxine (SYNTHROID, LEVOTHROID) 50 MCG tablet [id number] Yes Take 50 mcg by mouth daily before breakfast. [provider] Taking Active Self  miconazole (MICONAZOLE 7) 2 % vaginal cream [id number] Yes Place 1 Applicatorful vaginally at bedtime. Benjamine Sprague, DO Taking Active   omeprazole (PRILOSEC) 20 MG capsule [id number] Yes Take 20 mg by mouth daily as needed.  [provider] Taking Active          ASSESSMENT: Date Discharged from Hospital:  Date Medication Reconciliation Performed: 08/19/2018  Medications Discontinued at Discharge:   Augmentin  New Medications at Discharge:   Cephalexin  Ciprofloxacin  Patient was recently discharged from hospital and all medications have been reviewed.  Drugs sorted by system:  Pulmonary/Allergy: azelastine, budesonide/formoterol  Gastrointestinal: omeprazole  Endocrine:  glipizide, levothyroxine  Pain: acetaminophen   Infectious Diseases: cephalexin, ciprofloxacin, miconazole  Vitamins/Minerals/Supplements: folic acid  Miscellaneous: hydroxychloroquine  Medication Review Findings:  . The hypoglycemic effects of sulfonylureas may be increased by Ciprofloxacin especially in elderly patients with renal compromise.   Patient's renal function is good.   PLAN: -Instructed patient to take new medications as prescribed and discontinue old medications as prescribed   Route note to PCP, Dr. Edwina Barth.  Joetta Manners, PharmD Clinical Pharmacist [place] [phone]

## 2018-08-20 ENCOUNTER — Ambulatory Visit: Payer: PPO

## 2018-08-24 ENCOUNTER — Ambulatory Visit
Admission: RE | Admit: 2018-08-24 | Discharge: 2018-08-24 | Disposition: A | Discharge status: Home / Self Care | Payer: PPO | Source: Ambulatory Visit | Attending: Surgery | Admitting: Surgery

## 2018-08-24 ENCOUNTER — Ambulatory Visit: Payer: Self-pay | Admitting: Surgery

## 2018-08-24 ENCOUNTER — Other Ambulatory Visit: Payer: Self-pay | Admitting: Surgery

## 2018-08-24 ENCOUNTER — Other Ambulatory Visit: Payer: Self-pay

## 2018-08-24 ENCOUNTER — Inpatient Hospital Stay
Admission: AD | Admit: 2018-08-24 | Discharge: 2018-08-27 | DRG: 863 | Disposition: A | Discharge status: Home Health | Payer: PPO | Attending: Surgery | Admitting: Surgery

## 2018-08-24 DIAGNOSIS — E039 Hypothyroidism, unspecified: Secondary | ICD-10-CM | POA: Diagnosis not present

## 2018-08-24 DIAGNOSIS — N611 Abscess of the breast and nipple: Secondary | ICD-10-CM

## 2018-08-24 DIAGNOSIS — Z9013 Acquired absence of bilateral breasts and nipples: Secondary | ICD-10-CM | POA: Diagnosis not present

## 2018-08-24 DIAGNOSIS — C50912 Malignant neoplasm of unspecified site of left female breast: Secondary | ICD-10-CM | POA: Diagnosis not present

## 2018-08-24 DIAGNOSIS — Z79890 Hormone replacement therapy (postmenopausal): Secondary | ICD-10-CM

## 2018-08-24 DIAGNOSIS — Z79899 Other long term (current) drug therapy: Secondary | ICD-10-CM | POA: Diagnosis not present

## 2018-08-24 DIAGNOSIS — E119 Type 2 diabetes mellitus without complications: Secondary | ICD-10-CM | POA: Diagnosis present

## 2018-08-24 DIAGNOSIS — Z801 Family history of malignant neoplasm of trachea, bronchus and lung: Secondary | ICD-10-CM | POA: Diagnosis not present

## 2018-08-24 DIAGNOSIS — Z87442 Personal history of urinary calculi: Secondary | ICD-10-CM

## 2018-08-24 DIAGNOSIS — K219 Gastro-esophageal reflux disease without esophagitis: Secondary | ICD-10-CM | POA: Diagnosis present

## 2018-08-24 DIAGNOSIS — Z87891 Personal history of nicotine dependence: Secondary | ICD-10-CM | POA: Diagnosis not present

## 2018-08-24 DIAGNOSIS — Z170 Estrogen receptor positive status [ER+]: Secondary | ICD-10-CM | POA: Diagnosis not present

## 2018-08-24 DIAGNOSIS — J449 Chronic obstructive pulmonary disease, unspecified: Secondary | ICD-10-CM | POA: Diagnosis not present

## 2018-08-24 DIAGNOSIS — C50911 Malignant neoplasm of unspecified site of right female breast: Secondary | ICD-10-CM | POA: Diagnosis not present

## 2018-08-24 DIAGNOSIS — L7682 Other postprocedural complications of skin and subcutaneous tissue: Secondary | ICD-10-CM | POA: Diagnosis not present

## 2018-08-24 DIAGNOSIS — Z808 Family history of malignant neoplasm of other organs or systems: Secondary | ICD-10-CM | POA: Diagnosis not present

## 2018-08-24 DIAGNOSIS — Z9981 Dependence on supplemental oxygen: Secondary | ICD-10-CM

## 2018-08-24 DIAGNOSIS — Z8049 Family history of malignant neoplasm of other genital organs: Secondary | ICD-10-CM

## 2018-08-24 DIAGNOSIS — N6489 Other specified disorders of breast: Secondary | ICD-10-CM | POA: Diagnosis not present

## 2018-08-24 DIAGNOSIS — M069 Rheumatoid arthritis, unspecified: Secondary | ICD-10-CM | POA: Diagnosis not present

## 2018-08-24 DIAGNOSIS — T8140XA Infection following a procedure, unspecified, initial encounter: Principal | ICD-10-CM | POA: Diagnosis present

## 2018-08-24 DIAGNOSIS — Z882 Allergy status to sulfonamides status: Secondary | ICD-10-CM

## 2018-08-24 LAB — GLUCOSE, CAPILLARY
Glucose-Capillary: 220 mg/dL — ABNORMAL HIGH (ref 70–99)
Glucose-Capillary: 63 mg/dL — ABNORMAL LOW (ref 70–99)
Glucose-Capillary: 145 mg/dL — ABNORMAL HIGH (ref 70–99)

## 2018-08-24 MED ORDER — HYDROCODONE-ACETAMINOPHEN 5-325 MG PO TABS: 1.0000 | ORAL_TABLET | ORAL | Status: DC | PRN

## 2018-08-24 MED ORDER — ENOXAPARIN SODIUM 40 MG/0.4ML ~~LOC~~ SOLN
40.0000 mg | SUBCUTANEOUS | Status: DC
  Administered 2018-08-24 – 2018-08-26 (×3): 40 mg via SUBCUTANEOUS
  Filled 2018-08-24 (×3): qty 0.4

## 2018-08-24 MED ORDER — HYDROXYCHLOROQUINE SULFATE 200 MG PO TABS
400.0000 mg | ORAL_TABLET | Freq: Every day | ORAL | Status: DC
  Administered 2018-08-25 – 2018-08-27 (×3): 400 mg via ORAL
  Filled 2018-08-24 (×4): qty 2

## 2018-08-24 MED ORDER — INSULIN ASPART 100 UNIT/ML ~~LOC~~ SOLN
0.0000 [IU] | Freq: Three times a day (TID) | SUBCUTANEOUS | Status: DC
  Administered 2018-08-24: 3 [IU] via SUBCUTANEOUS
  Administered 2018-08-25 – 2018-08-27 (×2): 1 [IU] via SUBCUTANEOUS
  Filled 2018-08-24 (×3): qty 1

## 2018-08-24 MED ORDER — AZELASTINE HCL 0.1 % NA SOLN
1.0000 | Freq: Two times a day (BID) | NASAL | Status: DC
  Administered 2018-08-24 – 2018-08-27 (×6): 1 via NASAL
  Filled 2018-08-24: qty 30

## 2018-08-24 MED ORDER — ACETAMINOPHEN 325 MG PO TABS: 650.0000 mg | ORAL_TABLET | Freq: Four times a day (QID) | ORAL | Status: DC | PRN

## 2018-08-24 MED ORDER — CLOTRIMAZOLE 1 % VA CREA
1.0000 | TOPICAL_CREAM | Freq: Every day | VAGINAL | Status: DC
  Administered 2018-08-24 – 2018-08-26 (×3): 1 via VAGINAL
  Filled 2018-08-24: qty 45

## 2018-08-24 MED ORDER — ONDANSETRON HCL 4 MG/2ML IJ SOLN: 4.0000 mg | Freq: Four times a day (QID) | INTRAMUSCULAR | Status: DC | PRN

## 2018-08-24 MED ORDER — SODIUM CHLORIDE 0.9 % IV SOLN
INTRAVENOUS | Status: DC | PRN
  Administered 2018-08-24 – 2018-08-26 (×3): 250 mL via INTRAVENOUS

## 2018-08-24 MED ORDER — SODIUM CHLORIDE 0.9 % IV SOLN
1.0000 g | INTRAVENOUS | Status: DC
  Administered 2018-08-24 – 2018-08-27 (×4): 1 g via INTRAVENOUS
  Filled 2018-08-24: qty 1
  Filled 2018-08-24: qty 10
  Filled 2018-08-24 (×2): qty 1

## 2018-08-24 MED ORDER — FOLIC ACID 1 MG PO TABS
1.0000 mg | ORAL_TABLET | Freq: Every day | ORAL | Status: DC
  Administered 2018-08-25 – 2018-08-27 (×3): 1 mg via ORAL
  Filled 2018-08-24 (×3): qty 1

## 2018-08-24 MED ORDER — MOMETASONE FURO-FORMOTEROL FUM 200-5 MCG/ACT IN AERO
2.0000 | INHALATION_SPRAY | Freq: Two times a day (BID) | RESPIRATORY_TRACT | Status: DC
  Administered 2018-08-24 – 2018-08-27 (×6): 2 via RESPIRATORY_TRACT
  Filled 2018-08-24: qty 8.8

## 2018-08-24 MED ORDER — GLIPIZIDE 5 MG PO TABS
5.0000 mg | ORAL_TABLET | Freq: Every day | ORAL | Status: DC
  Administered 2018-08-25 – 2018-08-27 (×3): 5 mg via ORAL
  Filled 2018-08-24 (×3): qty 1

## 2018-08-24 MED ORDER — DOCUSATE SODIUM 100 MG PO CAPS: 100.0000 mg | ORAL_CAPSULE | Freq: Two times a day (BID) | ORAL | Status: DC | PRN

## 2018-08-24 MED ORDER — LEVOTHYROXINE SODIUM 50 MCG PO TABS
50.0000 ug | ORAL_TABLET | Freq: Every day | ORAL | Status: DC
  Administered 2018-08-25 – 2018-08-27 (×3): 50 ug via ORAL
  Filled 2018-08-24 (×3): qty 1

## 2018-08-24 MED ORDER — PANTOPRAZOLE SODIUM 40 MG PO TBEC
40.0000 mg | DELAYED_RELEASE_TABLET | Freq: Every day | ORAL | Status: DC
  Administered 2018-08-25 – 2018-08-27 (×3): 40 mg via ORAL
  Filled 2018-08-24 (×3): qty 1

## 2018-08-24 MED ORDER — ONDANSETRON 4 MG PO TBDP: 4.0000 mg | ORAL_TABLET | Freq: Four times a day (QID) | ORAL | Status: DC | PRN

## 2018-08-24 NOTE — H&P (Unsigned)
CC: right breast cellulitis  HPI:  Amy Woodard is a 77 y.o. female who presents with worsening cellulitis from previous visit as noted below.  No systemic symptoms but is complaining of increasing pain and warmth in the area in addition to the erythema.   77 year old female scheduled for Port-A-Cath insertion day before admission. Upon arrival to the preop area patient stated that she had some episodes of confusion as well as a fever last night along with increasing erythema in her right breast. She underwent right partial mastectomy a couple months ago. She did not complain of any issues with the incision site until a day or 2 ago. Upon inspection it was noted that she had blanching cellulitis throughout the right breast mostly centered around the former incision site. Had ultrasound performed by myself did not note a fluid collection consistent with possible abscess. After discussion with patient and caretaker, it was decided that we will postpone the Port-A-Cath insertion today and attempt a bedside needle aspiration of the presumed right breast abscess. We will reschedule her port placement for the following Tuesday in hopes that by then the cellulitis and abscess will be resolved and we can proceed safely. Patient and caretaker verbalized understanding and gave verbal consent to proceed with a needle aspiration at bedside, which was uneventful.  Sent home on antibiotics.   Past Medical History:  has a past medical history of Allergy, Arthritis, Breast cancer ([place]) (05/2018), Cellulitis of breast (05/2018), COPD (chronic obstructive pulmonary disease) ([place]), Diabetes mellitus without complication ([place]), Dyspnea, GERD (gastroesophageal reflux disease), History of kidney stones, Hypothyroidism, and Oxygen dependent.  Past Surgical History:  has a past surgical history that includes Appendectomy; Partial mastectomy with needle localization and axillary sentinel lymph node bx (Right,  04/16/2018); Back surgery; Breast biopsy (Right, 03/19/2018); Breast biopsy (Left, 06/01/2018); Breast biopsy (Right, 06/01/2018); Breast lumpectomy (Right, 04/16/2018); Partial mastectomy with needle localization and axillary sentinel lymph node bx (Left, 07/03/2018); Breast lumpectomy (Right, 07/03/2018); and Irrigation and debridement hematoma (Left, 07/10/2018).  Family History: family history includes Alcohol abuse in her father; Cervical cancer in her other; Clotting disorder in her mother; Lung cancer in her sister; Melanoma in her mother.  Social History:  reports that she quit smoking about 25 years ago. Her smoking use included cigarettes. She has never used smokeless tobacco. She reports that she does not drink alcohol or use drugs.  Current Medications:        Medications Prior to Admission  Medication Sig Dispense Refill  . acetaminophen (TYLENOL) 650 MG CR tablet Take 1 tablet (650 mg total) by mouth every 8 (eight) hours as needed for pain. 30 tablet 0  . amoxicillin-clavulanate (AUGMENTIN) 875-125 MG tablet Take 1 tablet by mouth 2 (two) times daily for 7 days. 14 tablet 0  . azelastine (ASTELIN) 0.1 % nasal spray Place 1 spray into both nostrils 2 (two) times daily.     . budesonide-formoterol (SYMBICORT) 160-4.5 MCG/ACT inhaler Inhale 2 puffs into the lungs 2 (two) times daily.     . folic acid (FOLVITE) 1 MG tablet Take 1 mg by mouth daily.    [name] glipiZIDE (GLUCOTROL) 5 MG tablet Take 5 mg by mouth daily before breakfast.    . hydroxychloroquine (PLAQUENIL) 200 MG tablet Take 400 mg by mouth 2 (two) times daily.     [name] levothyroxine (SYNTHROID, LEVOTHROID) 50 MCG tablet Take 50 mcg by mouth daily before breakfast.    . omeprazole (PRILOSEC) 20 MG capsule Take 20 mg by  mouth daily.      Allergies:      Allergies  Allergen Reactions  . Sulfa Antibiotics Itching    ROS:  General: Denies weight loss, weight gain, fatigue, fevers, chills, and night  sweats. Eyes: Denies blurry vision, double vision, eye pain, itchy eyes, and tearing. Ears: Denies hearing loss, earache, and ringing in ears. Nose: Denies sinus pain, congestion, infections, runny nose, and nosebleeds. Mouth/throat: Denies hoarseness, sore throat, bleeding gums, and difficulty swallowing. Heart: Denies chest pain, palpitations, racing heart, irregular heartbeat, leg pain or swelling, and decreased activity tolerance. Respiratory: Denies breathing difficulty, shortness of breath, wheezing, cough, and sputum. GI: Denies change in appetite, heartburn, nausea, vomiting, constipation, diarrhea, and blood in stool. GU: Denies difficulty urinating, pain with urinating, urgency, frequency, blood in urine, and heavy menstrual bleeding. Musculoskeletal: Denies joint stiffness, pain, swelling, muscle weakness, and pain. Skin: Denies rash, itching, mass, tumors, sores, and boils Neurologic: Denies headache, fainting, dizziness, seizures, numbness, and tingling. Psychiatric: Denies depression, anxiety, difficulty sleeping, and memory loss. Endocrine: Denies heat or cold intolerance, and increased thirst or urination. Blood/lymph: Denies easy bruising, easy bruising, and swollen glands   pertinent positives and negatives noted in HPI   Objective:   BP (!) 112/58 (BP Location: Right Leg)   Pulse 78   Temp 98.2 F (36.8 C) (Oral)   Resp 18   SpO2 99%   Constitutional :  alert, cooperative, appears stated age and no distress  Lymphatics/Throat:  no asymmetry, masses, or scars  Respiratory:  clear to auscultation bilaterally  Cardiovascular:  regular rate and rhythm  Gastrointestinal: soft, non-tender; bowel sounds normal; no masses,  no organomegaly.  Musculoskeletal: Steady gait and movement  Skin: Cool and moist, chaperone present for exam.  Increased erythema and swelling noted in area of concern over right breast incision, noted to extend beyone the previously marked site.   Warm to touch, slightly tender  Psychiatric: Normal affect, non-agitated, not confused       LABS:  CMP Latest Ref Rng & Units 08/15/2018 08/14/2018 07/12/2018  Glucose 70 - 99 mg/dL 119(H) 124(H) 110(H)  BUN 8 - 23 mg/dL 8 11 7(L)  Creatinine 0.44 - 1.00 mg/dL 0.52 0.44 0.46  Sodium 135 - 145 mmol/L 141 137 139  Potassium 3.5 - 5.1 mmol/L 3.6 4.4 4.0  Chloride 98 - 111 mmol/L 99 99 98  CO2 22 - 32 mmol/L 35(H) 30 36(H)  Calcium 8.9 - 10.3 mg/dL 8.8(L) 8.7(L) 8.7(L)  Total Protein 6.5 - 8.1 g/dL - - -  Total Bilirubin 0.3 - 1.2 mg/dL - - -  Alkaline Phos 38 - 126 U/L - - -  AST 15 - 41 U/L - - -  ALT 0 - 44 U/L - - -   CBC Latest Ref Rng & Units 08/15/2018 08/14/2018 07/13/2018  WBC 4.0 - 10.5 K/uL 7.9 10.3 10.5  Hemoglobin 12.0 - 15.0 g/dL 11.5(L) 12.2 10.4(L)  Hematocrit 36.0 - 46.0 % 37.0 39.3 33.5(L)  Platelets 150 - 400 K/uL 230 233 425(H)    RADS: CLINICAL DATA: Patient has a recent history of bilateral breast cancer. Patient status post lumpectomy of the right breast in November. Patient presents with diffuse redness of the right breast. Ultrasound is requested through the emergency room to assess for abscess.  EXAM: ULTRASOUND OF THE RIGHT BREAST  COMPARISON: Previous exam(s).  FINDINGS: On physical exam, there is diffuse redness induration of the entire right breast.  Targeted ultrasound is performed, showing minimal complicated fluid collection  at the upper right breast measuring at least 6.6 cm. This is probably a surgical seroma. No other fluid collection is identified throughout right breast.  IMPRESSION: Benign findings.  RECOMMENDATION: Ultrasound findings are consistent most consistent with postsurgical seroma. There is diffuse induration and redness of the entire right breast. Antibiotic treatment is recommended.  I have discussed the findings and recommendations with the patient. Results were also provided in writing at the conclusion  of the visit. If applicable, a reminder letter will be sent to the patient regarding the next appointment.  BI-RADS CATEGORY 2: Benign.   Electronically Signed By: Abelardo Diesel M.D. On: 08/14/2018 16:49   Assessment:     right breast cellulitis   Plan:   1. Although labs and [place] reassuring, the cellulitis on clinical exam and patient symptoms worsened since intial exam day prior to admission despite drainage and antibiotics.  Due to upcoming surgery and her complicated medical history, including post operative complications in the past, will admit and start IV abx and monitor.   UPDATE: Case discussed with ID and deemed it ok to proceed with         Electronically signed by Benjamine Sprague, DO at 08/15/2018 12:23 PM

## 2018-08-24 NOTE — Progress Notes (Signed)
Pt admitted to floor for right breast cellulitis. Pt has bandaid to right breast from a fluid aspiration. Breast is red and has border markings from previous provider. Redness is receded from border. Breast tissue is puckered and red, firm to touch. Pt denies pain.

## 2018-08-24 NOTE — H&P (Addendum)
Subjective:   CC: Breast abscess of female [N61.1]   HPI:  Amy Woodard is a 77 y.o. female who is here for above CC.  Was doing well ok until two days ago, when she started noticing increased erythema in the area again.     Current Medications: has a current medication list which includes the following prescription(s): azelastine, budesonide-formoterol, cephalexin, ciprofloxacin hcl, folic acid, glipizide, ipratropium-albuterol, levothyroxine, and tizanidine.  Allergies:      Allergies  Allergen Reactions  . Sulfa (Sulfonamide Antibiotics) Itching and Rash  . Sulfasalazine Itching    ROS: A 15 point review of systems was performed and pertinent positives and negatives noted in HPI   Objective:   BP 166/82   Pulse 103   Temp 36.6 C (97.9 F) (Oral)   Ht 172.7 cm (5\' 8" )   Wt 72.6 kg (160 lb 0.9 oz)   BMI 24.34 kg/m   Constitutional :  alert, appears stated age, cooperative and no distress  Skin: Chaperone present for exam.  Recurrent infection noted in right breast area, with tendernss and firmness deep to it, likely another abscess.    Psychiatric: Normal affect, non-agitated, not confused       LABS:  N/A   RADS: N/A  Assessment:      Breast abscess of female [N61.1]  Plan:   1. Called rads and they will perform [place] and aspirate any fluid if needed.  Called ID and they think IV abx again is warranted.  Will wait to hear back from onc re: how much longer we can postpone chemo before some long term outcomes maybe affected.  I'm afraid of proceeding with port placement due to these persistent infections potentially being a symptom of some underlying issue that makes her prone on getting repeat infections...    Electronically signed by Benjamine Sprague, DO on 08/24/2018 2:15 PM    Admit for IV abx, ID inpatient consult

## 2018-08-24 NOTE — Progress Notes (Signed)
Pt fs 64. Given graham crackers and peanut butter and a regular coke. Pt not symptomatic and a&o. Will recheck in 30 min

## 2018-08-25 ENCOUNTER — Ambulatory Visit: Admission: RE | Admit: 2018-08-25 | Payer: PPO | Source: Home / Self Care | Admitting: Surgery

## 2018-08-25 ENCOUNTER — Encounter: Admission: RE | Payer: Self-pay | Source: Home / Self Care

## 2018-08-25 LAB — CBC
HCT: 37.5 % (ref 36.0–46.0)
Hemoglobin: 11.4 g/dL — ABNORMAL LOW (ref 12.0–15.0)
MCH: 27.3 pg (ref 26.0–34.0)
MCHC: 30.4 g/dL (ref 30.0–36.0)
MCV: 89.7 fL (ref 80.0–100.0)
Platelets: 301 10*3/uL (ref 150–400)
RBC: 4.18 MIL/uL (ref 3.87–5.11)
RDW: 13.2 % (ref 11.5–15.5)
WBC: 6.1 10*3/uL (ref 4.0–10.5)
nRBC: 0 % (ref 0.0–0.2)

## 2018-08-25 LAB — GLUCOSE, CAPILLARY
Glucose-Capillary: 99 mg/dL (ref 70–99)
Glucose-Capillary: 123 mg/dL — ABNORMAL HIGH (ref 70–99)
Glucose-Capillary: 117 mg/dL — ABNORMAL HIGH (ref 70–99)
Glucose-Capillary: 159 mg/dL — ABNORMAL HIGH (ref 70–99)

## 2018-08-25 LAB — PHOSPHORUS: Phosphorus: 3.7 mg/dL (ref 2.5–4.6)

## 2018-08-25 LAB — BASIC METABOLIC PANEL
Anion gap: 5 (ref 5–15)
BUN: 9 mg/dL (ref 8–23)
CO2: 32 mmol/L (ref 22–32)
Calcium: 8.6 mg/dL — ABNORMAL LOW (ref 8.9–10.3)
Chloride: 103 mmol/L (ref 98–111)
Creatinine, Ser: 0.52 mg/dL (ref 0.44–1.00)
GFR calc Af Amer: >60 mL/min (ref >60)
GFR calc non Af Amer: >60 mL/min (ref >60)
Glucose, Bld: 116 mg/dL — ABNORMAL HIGH (ref 70–99)
Potassium: 3.6 mmol/L (ref 3.5–5.1)
Sodium: 140 mmol/L (ref 135–145)

## 2018-08-25 LAB — MAGNESIUM: Magnesium: 2.3 mg/dL (ref 1.7–2.4)

## 2018-08-25 SURGERY — INSERTION, TUNNELED CENTRAL VENOUS DEVICE, WITH PORT
Anesthesia: Choice

## 2018-08-25 NOTE — Consult Note (Signed)
Infectious Disease     Reason for Consult: Recurrent Cellulitis breast  Referring Physician: Lysle Pearl Date of Admission:  08/24/2018   Active Problems:   Abscess of right breast   Breast abscess   HPI: Amy Woodard is a 77 y.o. female admitted yesterday with recurrent redness of her breast at site of prior surgery despite recent aspiration and IV then oral antibiotic therapy.  She has a hx of breast cancer and underwent R partial masttectomy in August without complication (did have surgery as well on the L in Oct complicated by axillary hematoma).  She was due to have port placement for chemotherapy on 12/5 but presented with some redness and pain at the site and fevers and chills. Had US showing fluid collection and had bedside aspiration of 35 cc of dark old hematoma like fluid removed. Treated at that time with oral augmentin however did not improve so admitted 12/6.  Culture grew serratia and was treated with IV ceftriaxone then dced on oral cipro and keflex.  However when seen yesterday area was inflamed and red again.  Had been fine until 2 days ago. No trauma to the site.   Had repeat US and then aspiration of 15 cc ss fluid with some purulence towards end of aspiration. Admitted now and on IV ceftraixone.  No fever, wbc nml.   Past Medical History:  Diagnosis Date  . Allergy   . Arthritis    rheumatoid arthritis  . Breast cancer ([place]) 05/2018   Right breast found 1st, then left breast with ductal ca  . Cellulitis of breast 05/2018   right breast cellulitis after first biopsy,  antibiotics prescribed  . COPD (chronic obstructive pulmonary disease) ([place])   . Diabetes mellitus without complication ([place])   . Dyspnea   . GERD (gastroesophageal reflux disease)    occasionally  . History of kidney stones    has one but not a problem  . Hypothyroidism   . Oxygen dependent    2liter nasal prong continuously   Past Surgical History:  Procedure Laterality Date  . APPENDECTOMY    .  BACK SURGERY     lumbar. herniated disc . no metal  . BREAST BIOPSY Right 03/19/2018   INVASIVE MAMMARY CARCINOMA, NO SPECIAL TYPE  . BREAST BIOPSY Left 06/01/2018   MRI bx, grade III invasive ductal carcinoma  . BREAST BIOPSY Right 06/01/2018   MRI bx of enhancing mass, path showed FOREIGN BODY GRANULOMATOUS RESPONSE   . 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 BREAST LUMPECTOMY Right 07/03/2018   Procedure: RE-EXCISION OF RIGHT BREAST TISSUE MASS;  Surgeon: Benjamine Sprague, DO;  Location: [facility] ORS;  Service: General;  Laterality: Right;  . IRRIGATION AND DEBRIDEMENT HEMATOMA Left 07/10/2018   Procedure: IRRIGATION AND DEBRIDEMENT HEMATOMA LEFT AXILLARY;  Surgeon: Benjamine Sprague, DO;  Location: [facility] ORS;  Service: General;  Laterality: Left;  . PARTIAL MASTECTOMY WITH NEEDLE LOCALIZATION AND AXILLARY SENTINEL LYMPH NODE BX Right 04/16/2018   Procedure: PARTIAL MASTECTOMY WITH NEEDLE LOCALIZATION AND AXILLARY SENTINEL LYMPH NODE BX;  Surgeon: Benjamine Sprague, DO;  Location: [facility] ORS;  Service: General;  Laterality: Right;  . PARTIAL MASTECTOMY WITH NEEDLE LOCALIZATION AND AXILLARY SENTINEL LYMPH NODE BX Left 07/03/2018   Procedure: PARTIAL MASTECTOMY WITH NEEDLE LOCALIZATION AND SENTINEL LYMPH NODE BX;  Surgeon: Benjamine Sprague, DO;  Location: [facility] ORS;  Service: General;  Laterality: Left;   Social History   Tobacco Use  . Smoking status: Former Smoker  Types: Cigarettes    Last attempt to quit: 03/30/1993    Years since quitting: 25.4  . Smokeless tobacco: Never Used  Substance Use Topics  . Alcohol use: No  . Drug use: Never   Family History  Problem Relation Age of Onset  . Clotting disorder Mother   . Melanoma Mother        deceased 69  . Lung cancer Sister        deceased 21s; smoker  . Alcohol abuse Father        deceased 29  . Cervical cancer Other        neice    Allergies:  Allergies  Allergen Reactions  . Sulfa Antibiotics Itching    Current  antibiotics: Antibiotics Given (last 72 hours)    Date/Time Action Medication Dose Rate   08/24/18 2004 New Bag/Given   cefTRIAXone (ROCEPHIN) 1 g in sodium chloride 0.9 % 100 mL IVPB 1 g 200 mL/hr   08/25/18 0858 Given   hydroxychloroquine (PLAQUENIL) tablet 400 mg 400 mg       MEDICATIONS: . azelastine  1 spray Each Nare BID  . clotrimazole  1 Applicatorful Vaginal QHS  . enoxaparin (LOVENOX) injection  40 mg Subcutaneous Q24H  . folic acid  1 mg Oral Daily  . glipiZIDE  5 mg Oral QAC breakfast  . hydroxychloroquine  400 mg Oral Daily  . insulin aspart  0-9 Units Subcutaneous TID WC  . levothyroxine  50 mcg Oral Q0600  . mometasone-formoterol  2 puff Inhalation BID  . pantoprazole  40 mg Oral Daily    Review of Systems - 11 systems reviewed and negative per HPI   OBJECTIVE: Temp:  [98.1 F (36.7 C)-98.9 F (37.2 C)] 98.4 F (36.9 C) (12/17 0804) Pulse Rate:  [67-104] 67 (12/17 0804) Resp:  [16-20] 20 (12/17 0804) BP: (110-151)/(47-113) 120/63 (12/17 0804) SpO2:  [94 %-98 %] 98 % (12/17 0804) Weight:  [70 kg] 70 kg (12/16 1900) Physical Exam  Constitutional:  oriented to person, place, and time.  HENT: [place]/AT, PERRLA, no scleral icterus Mouth/Throat: Oropharynx is clear and moist. No oropharyngeal exudate.  Cardiovascular: Normal rate, regular rhythm and normal heart sounds. Exam reveals no gallop and no friction rub.  Breast exam- R breast with mild induration, and erythema, no draiange.  There is scar above the nipple from prior surgery. Pulmonary/Chest: Effort normal and breath sounds normal. No respiratory distress.  has no wheezes.  Neck = supple, no nuchal rigidity Abdominal: Soft. Bowel sounds are normal.  exhibits no distension. There is no tenderness.  Lymphadenopathy: no cervical adenopathy. No axillary adenopathy Neurological: alert and oriented to person, place, and time.  Skin: Skin is warm and dry. No rash noted. No erythema.  Psychiatric: a normal mood  and affect.  behavior is normal.    LABS: Results for orders placed or performed during the hospital encounter of 08/24/18 (from the past 48 hour(s))  Glucose, capillary     Status: Abnormal   Collection Time: 08/24/18  5:07 PM  Result Value Ref Range   Glucose-Capillary 220 (H) 70 - 99 mg/dL  Glucose, capillary     Status: Abnormal   Collection Time: 08/24/18  9:14 PM  Result Value Ref Range   Glucose-Capillary 63 (L) 70 - 99 mg/dL   Comment 1 Notify RN   Glucose, capillary     Status: Abnormal   Collection Time: 08/24/18  9:59 PM  Result Value Ref Range   Glucose-Capillary 145 (H) 70 -  99 mg/dL  Basic metabolic panel     Status: Abnormal   Collection Time: 08/25/18  5:02 AM  Result Value Ref Range   Sodium 140 135 - 145 mmol/L   Potassium 3.6 3.5 - 5.1 mmol/L   Chloride 103 98 - 111 mmol/L   CO2 32 22 - 32 mmol/L   Glucose, Bld 116 (H) 70 - 99 mg/dL   BUN 9 8 - 23 mg/dL   Creatinine, Ser 0.52 0.44 - 1.00 mg/dL   Calcium 8.6 (L) 8.9 - 10.3 mg/dL   GFR calc non Af Amer >60 >60 mL/min   GFR calc Af Amer >60 >60 mL/min   Anion gap 5 5 - 15    Comment: Performed at [facility], [address] [place], [place] [address]  Magnesium     Status: None   Collection Time: 08/25/18  5:02 AM  Result Value Ref Range   Magnesium 2.3 1.7 - 2.4 mg/dL    Comment: Performed at [facility], [address] [place], [place] [address]  Phosphorus     Status: None   Collection Time: 08/25/18  5:02 AM  Result Value Ref Range   Phosphorus 3.7 2.5 - 4.6 mg/dL    Comment: Performed at [facility], [address] [place], [place] [address]  CBC     Status: Abnormal   Collection Time: 08/25/18  5:02 AM  Result Value Ref Range   WBC 6.1 4.0 - 10.5 K/uL   RBC 4.18 3.87 - 5.11 MIL/uL   Hemoglobin 11.4 (L) 12.0 - 15.0 g/dL   HCT 37.5 36.0 - 46.0 %   MCV 89.7 80.0 - 100.0 fL   MCH 27.3 26.0 - 34.0 pg   MCHC 30.4 30.0 - 36.0 g/dL   RDW 13.2 11.5 - 15.5 %    Platelets 301 150 - 400 K/uL   nRBC 0.0 0.0 - 0.2 %    Comment: Performed at [facility], [address] [place], [place] [address]  Glucose, capillary     Status: None   Collection Time: 08/25/18  7:43 AM  Result Value Ref Range   Glucose-Capillary 99 70 - 99 mg/dL   No components found for: ESR, C REACTIVE PROTEIN MICRO: Recent Results (from the past 720 hour(s))  Aerobic/Anaerobic Culture (surgical/deep wound)     Status: None   Collection Time: 08/13/18  9:34 AM  Result Value Ref Range Status   Specimen Description   Final    ABSCESS BREAST RIGHT Performed at [facility], [place] [address], [place], [place] [address]    Special Requests   Final    Normal Performed at [facility], [address] [place], [place] [address]    Gram Stain   Final    FEW WBC PRESENT, PREDOMINANTLY PMN NO ORGANISMS SEEN    Culture   Final    FEW SERRATIA MARCESCENS NO ANAEROBES ISOLATED Performed at [facility], [place] [address] [place], [place] [address]    Report Status 08/18/2018 FINAL  Final   Organism ID, Bacteria SERRATIA MARCESCENS  Final      Susceptibility   Serratia marcescens - MIC*    CEFAZOLIN >=64 RESISTANT Resistant     CEFEPIME <=1 SENSITIVE Sensitive     CEFTAZIDIME <=1 SENSITIVE Sensitive     CEFTRIAXONE <=1 SENSITIVE Sensitive     CIPROFLOXACIN <=0.25 SENSITIVE Sensitive     GENTAMICIN <=1 SENSITIVE Sensitive     TRIMETH/SULFA <=20 SENSITIVE  Sensitive     * FEW SERRATIA MARCESCENS    IMAGING: US Breast Ltd Uni Right Inc Axilla  Result Date: 08/24/2018 CLINICAL DATA:  Recent history of bilateral breast cancer. Post malignant right lumpectomy November 2019 with subsequent complex fluid collection at the lumpectomy site post aspiration 2 weeks ago. Previous ultrasound 08/14/2018 demonstrates persistent 6.6 cm complex fluid collection. Patient is nearing the end of a 12 day course of antibiotics. EXAM: ULTRASOUND OF THE RIGHT BREAST  COMPARISON:  Previous exam(s). FINDINGS: On physical exam, there is erythema and induration over the upper central right breast. Targeted ultrasound is performed, showing persistent complex fluid collection at the 12 o'clock position of the right breast 2 cm from the nipple measuring 2.1 x 3.4 x 5.3 cm which is slightly smaller. IMPRESSION: Persistent but slightly smaller complex fluid collection at the 12 o'clock position of the right breast measuring 2.1 x 3.4 x 5.3 cm. This likely represents an infected postoperative fluid collection. RECOMMENDATION: Recommend aspiration of this probable infected postoperative right breast fluid collection and completion of patient's current antibiotic course. Recommend follow-up ultrasound in 2 weeks. I have discussed the findings and recommendations with the patient. Results were also provided in writing at the conclusion of the visit. If applicable, a reminder letter will be sent to the patient regarding the next appointment. BI-RADS CATEGORY  2: Benign. Right breast aspiration will be done today. Electronically Signed   By: Marin Olp M.D.   On: 08/24/2018 13:17   US Breast Ltd Uni Right Inc Axilla  Result Date: 08/14/2018 CLINICAL DATA:  Patient has a recent history of bilateral breast cancer. Patient status post lumpectomy of the right breast in November. Patient presents with diffuse redness of the right breast. Ultrasound is requested through the emergency room to assess for abscess. EXAM: ULTRASOUND OF THE RIGHT BREAST COMPARISON:  Previous exam(s). FINDINGS: On physical exam, there is diffuse redness induration of the entire right breast. Targeted ultrasound is performed, showing minimal complicated fluid collection at the upper right breast measuring at least 6.6 cm. This is probably a surgical seroma. No other fluid collection is identified throughout right breast. IMPRESSION: Benign findings. RECOMMENDATION: Ultrasound findings are consistent most consistent  with postsurgical seroma. There is diffuse induration and redness of the entire right breast. Antibiotic treatment is recommended. I have discussed the findings and recommendations with the patient. Results were also provided in writing at the conclusion of the visit. If applicable, a reminder letter will be sent to the patient regarding the next appointment. BI-RADS CATEGORY  2: Benign. Electronically Signed   By: Abelardo Diesel M.D.   On: 08/14/2018 16:49   US Breast Aspiration Right  Result Date: 08/24/2018 CLINICAL DATA:  Patient presents for aspiration of presumed infected postop fluid collection over the 12 o'clock position of the right breast. EXAM: ULTRASOUND GUIDED RIGHT BREAST CYST ASPIRATION COMPARISON:  Previous exams. PROCEDURE: Using sterile technique, 1% lidocaine, under direct ultrasound visualization, needle aspiration of the complex fluid collection at the 12 o'clock position was performed. 14 mL of cloudy sanguinous fluid was removed with suggestion of slight purulent material in the last 2-3 mL of aspirate. IMPRESSION: Ultrasound-guided aspiration of presumed infected postoperative fluid collection upper central right breast. No apparent complications. RECOMMENDATIONS: Recommend completion of patient's antibiotic course and follow-up ultrasound in 2 weeks. Electronically Signed   By: Marin Olp M.D.   On: 08/24/2018 13:19    Assessment:   TANELLE LANZO is a 77 y.o. female with recurrent  R breast cellulitis and small abscess sp drainage x 2 with cultures growing serratia. . She had surgery on the breast in August without incident.  She was treated for a few days with IV CTX then dced on oral cipro (for Serratia) and keflex for staph and strep.  However symptoms of pain and redness recurred after about 3-4 days.  US showed reaccumulation of fluid and drained 15 cc of serosanginous fluid.  I think overall this recurrence is less about antibiotic effectiveness and more about the  recurrence of the fluid into the potential space and inability to sterilize that fluid collection.     Recommendations Cont ceftriaxone - can Place picc if needed for IV abx but I think main issue is the need to find a permanent solution to the fluid collection either surgically or with IR placement of drain. Will discuss with surgery.  Thank you very much for allowing me to participate in the care of this patient. Please call with questions.   Cheral Marker. Ola Spurr, MD

## 2018-08-26 ENCOUNTER — Inpatient Hospital Stay: Payer: PPO

## 2018-08-26 ENCOUNTER — Inpatient Hospital Stay: Payer: Self-pay

## 2018-08-26 ENCOUNTER — Inpatient Hospital Stay: Payer: PPO | Admitting: Oncology

## 2018-08-26 ENCOUNTER — Ambulatory Visit: Payer: PPO

## 2018-08-26 LAB — GLUCOSE, CAPILLARY
Glucose-Capillary: 115 mg/dL — ABNORMAL HIGH (ref 70–99)
Glucose-Capillary: 81 mg/dL (ref 70–99)
Glucose-Capillary: 104 mg/dL — ABNORMAL HIGH (ref 70–99)
Glucose-Capillary: 176 mg/dL — ABNORMAL HIGH (ref 70–99)

## 2018-08-26 LAB — CBC
HCT: 37.8 % (ref 36.0–46.0)
Hemoglobin: 11.5 g/dL — ABNORMAL LOW (ref 12.0–15.0)
MCH: 27.4 pg (ref 26.0–34.0)
MCHC: 30.4 g/dL (ref 30.0–36.0)
MCV: 90.2 fL (ref 80.0–100.0)
NRBC: 0 % (ref 0.0–0.2)
Platelets: 324 10*3/uL (ref 150–400)
RBC: 4.19 MIL/uL (ref 3.87–5.11)
RDW: 13.2 % (ref 11.5–15.5)
WBC: 6.3 10*3/uL (ref 4.0–10.5)

## 2018-08-26 LAB — BASIC METABOLIC PANEL
Anion gap: 7 (ref 5–15)
BUN: 8 mg/dL (ref 8–23)
CO2: 30 mmol/L (ref 22–32)
Calcium: 8.2 mg/dL — ABNORMAL LOW (ref 8.9–10.3)
Chloride: 97 mmol/L — ABNORMAL LOW (ref 98–111)
Creatinine, Ser: 0.6 mg/dL (ref 0.44–1.00)
GFR calc Af Amer: >60 mL/min (ref >60)
GFR calc non Af Amer: >60 mL/min (ref >60)
Glucose, Bld: 138 mg/dL — ABNORMAL HIGH (ref 70–99)
Potassium: 3.3 mmol/L — ABNORMAL LOW (ref 3.5–5.1)
Sodium: 134 mmol/L — ABNORMAL LOW (ref 135–145)

## 2018-08-26 LAB — PHOSPHORUS: PHOSPHORUS: 3.3 mg/dL (ref 2.5–4.6)

## 2018-08-26 LAB — MAGNESIUM: Magnesium: 2.4 mg/dL (ref 1.7–2.4)

## 2018-08-26 MED ORDER — POTASSIUM CHLORIDE CRYS ER 20 MEQ PO TBCR
20.0000 meq | EXTENDED_RELEASE_TABLET | Freq: Once | ORAL | Status: AC
  Administered 2018-08-26: 20 meq via ORAL
  Filled 2018-08-26: qty 1

## 2018-08-26 MED ORDER — SODIUM CHLORIDE 0.9% FLUSH: 10.0000 mL | INTRAVENOUS | Status: DC | PRN

## 2018-08-26 MED ORDER — SODIUM CHLORIDE 0.9% FLUSH
10.0000 mL | Freq: Two times a day (BID) | INTRAVENOUS | Status: DC
  Administered 2018-08-26 – 2018-08-27 (×2): 10 mL

## 2018-08-26 NOTE — Progress Notes (Signed)
[facility] INFECTIOUS DISEASE PROGRESS NOTE Date of Admission:  08/24/2018     ID: Amy Woodard is a 77 y.o. female with  Recurrent breast abscess Active Problems:   Abscess of right breast   Breast abscess   Subjective: Feels a little better, less redness at breast site and less pain.  No fevers  ROS  Eleven systems are reviewed and negative except per hpi  Medications:  Antibiotics Given (last 72 hours)    Date/Time Action Medication Dose Rate   08/24/18 2004 New Bag/Given   cefTRIAXone (ROCEPHIN) 1 g in sodium chloride 0.9 % 100 mL IVPB 1 g 200 mL/hr   08/25/18 0858 Given   hydroxychloroquine (PLAQUENIL) tablet 400 mg 400 mg    08/25/18 1716 New Bag/Given   cefTRIAXone (ROCEPHIN) 1 g in sodium chloride 0.9 % 100 mL IVPB 1 g 200 mL/hr   08/26/18 0813 Given   hydroxychloroquine (PLAQUENIL) tablet 400 mg 400 mg      . azelastine  1 spray Each Nare BID  . clotrimazole  1 Applicatorful Vaginal QHS  . enoxaparin (LOVENOX) injection  40 mg Subcutaneous Q24H  . folic acid  1 mg Oral Daily  . glipiZIDE  5 mg Oral QAC breakfast  . hydroxychloroquine  400 mg Oral Daily  . insulin aspart  0-9 Units Subcutaneous TID WC  . levothyroxine  50 mcg Oral Q0600  . mometasone-formoterol  2 puff Inhalation BID  . pantoprazole  40 mg Oral Daily    Objective: Vital signs in last 24 hours: Temp:  [98 F (36.7 C)-99.1 F (37.3 C)] 99.1 F (37.3 C) (12/18 1148) Pulse Rate:  [77-86] 79 (12/18 1148) Resp:  [16-20] 16 (12/18 1148) BP: (130-159)/(58-68) 140/61 (12/18 1148) SpO2:  [97 %-98 %] 98 % (12/18 1148) Constitutional:  oriented to person, place, and time.  HENT: [place]/AT, PERRLA, no scleral icterus Mouth/Throat: Oropharynx is clear and moist. No oropharyngeal exudate.  Cardiovascular: Normal rate, regular rhythm and normal heart sounds. Exam reveals no gallop and no friction rub.  Breast exam- R breast with mild induration, very minimal erythema, no drainage.  There is scar with  some induration above the nipple from prior surgery. Pulmonary/Chest: Effort normal and breath sounds normal. No respiratory distress.  has no wheezes.  Neck = supple, no nuchal rigidity Abdominal: Soft. Bowel sounds are normal.  exhibits no distension. There is no tenderness.  Lymphadenopathy: no cervical adenopathy. No axillary adenopathy Neurological: alert and oriented to person, place, and time.  Skin: Skin is warm and dry. No rash noted except as breast exam above Psychiatric: a normal mood and affect.  behavior is normal.   Lab Results Recent Labs    08/25/18 0502 08/26/18 0352  WBC 6.1 6.3  HGB 11.4* 11.5*  HCT 37.5 37.8  NA 140 134*  K 3.6 3.3*  CL 103 97*  CO2 32 30  BUN 9 8  CREATININE 0.52 0.60    Microbiology: Results for orders placed or performed during the hospital encounter of 08/13/18  Aerobic/Anaerobic Culture (surgical/deep wound)     Status: None   Collection Time: 08/13/18  9:34 AM  Result Value Ref Range Status   Specimen Description   Final    ABSCESS BREAST RIGHT Performed at [facility], [place] [address] [place], [place] [address]    Special Requests   Final    Normal Performed at [facility], [address] [place], [place] [address]    Gram Stain  Final    FEW WBC PRESENT, PREDOMINANTLY PMN NO ORGANISMS SEEN    Culture   Final    FEW SERRATIA MARCESCENS NO ANAEROBES ISOLATED Performed at [facility], [place] [address] [place], [place] [address]    Report Status 08/18/2018 FINAL  Final   Organism ID, Bacteria SERRATIA MARCESCENS  Final      Susceptibility   Serratia marcescens - MIC*    CEFAZOLIN >=64 RESISTANT Resistant     CEFEPIME <=1 SENSITIVE Sensitive     CEFTAZIDIME <=1 SENSITIVE Sensitive     CEFTRIAXONE <=1 SENSITIVE Sensitive     CIPROFLOXACIN <=0.25 SENSITIVE Sensitive     GENTAMICIN <=1 SENSITIVE Sensitive     TRIMETH/SULFA <=20 SENSITIVE Sensitive     * FEW SERRATIA MARCESCENS     Studies/Results: US Breast Ltd Uni Right Inc Axilla  Result Date: 08/24/2018 CLINICAL DATA:  Recent history of bilateral breast cancer. Post malignant right lumpectomy November 2019 with subsequent complex fluid collection at the lumpectomy site post aspiration 2 weeks ago. Previous ultrasound 08/14/2018 demonstrates persistent 6.6 cm complex fluid collection. Patient is nearing the end of a 12 day course of antibiotics. EXAM: ULTRASOUND OF THE RIGHT BREAST COMPARISON:  Previous exam(s). FINDINGS: On physical exam, there is erythema and induration over the upper central right breast. Targeted ultrasound is performed, showing persistent complex fluid collection at the 12 o'clock position of the right breast 2 cm from the nipple measuring 2.1 x 3.4 x 5.3 cm which is slightly smaller. IMPRESSION: Persistent but slightly smaller complex fluid collection at the 12 o'clock position of the right breast measuring 2.1 x 3.4 x 5.3 cm. This likely represents an infected postoperative fluid collection. RECOMMENDATION: Recommend aspiration of this probable infected postoperative right breast fluid collection and completion of patient's current antibiotic course. Recommend follow-up ultrasound in 2 weeks. I have discussed the findings and recommendations with the patient. Results were also provided in writing at the conclusion of the visit. If applicable, a reminder letter will be sent to the patient regarding the next appointment. BI-RADS CATEGORY  2: Benign. Right breast aspiration will be done today. Electronically Signed   By: Marin Olp M.D.   On: 08/24/2018 13:17   US Breast Aspiration Right  Result Date: 08/24/2018 CLINICAL DATA:  Patient presents for aspiration of presumed infected postop fluid collection over the 12 o'clock position of the right breast. EXAM: ULTRASOUND GUIDED RIGHT BREAST CYST ASPIRATION COMPARISON:  Previous exams. PROCEDURE: Using sterile technique, 1% lidocaine, under direct  ultrasound visualization, needle aspiration of the complex fluid collection at the 12 o'clock position was performed. 14 mL of cloudy sanguinous fluid was removed with suggestion of slight purulent material in the last 2-3 mL of aspirate. IMPRESSION: Ultrasound-guided aspiration of presumed infected postoperative fluid collection upper central right breast. No apparent complications. RECOMMENDATIONS: Recommend completion of patient's antibiotic course and follow-up ultrasound in 2 weeks. Electronically Signed   By: Marin Olp M.D.   On: 08/24/2018 13:19    Assessment/Plan: Amy Woodard is a 77 y.o. female with recurrent R breast cellulitis and small abscess sp drainage x 2 with cultures growing serratia. . She had surgery on the breast in August without incident.  She was treated for a few days with IV CTX then dced on oral cipro (for Serratia) and keflex for staph and strep.  However symptoms of pain and redness recurred after about 3-4 days.  US showed reaccumulation of fluid and drained 15 cc of serosanginous fluid.  I think overall this recurrence is less about antibiotic effectiveness and more about the recurrence of the fluid into the potential space and inability to sterilize that fluid collection.    12/18- doing better with less redness and tenderness but still some induration above nipple, no fevers  Recommendations Cont ceftriaxone - can Place picc if needed for IV abx but I think main issue is the need to find a permanent solution to the fluid collection either surgically or with IR placement of drain. Will discuss with surgery. Thank you very much for the consult. Will follow with you.  Leonel Ramsay   08/26/2018, 12:10 PM

## 2018-08-26 NOTE — Progress Notes (Signed)
Peripherally Inserted Central Catheter/Midline Placement  The IV Nurse has discussed with the patient and/or persons authorized to consent for the patient, the purpose of this procedure and the potential benefits and risks involved with this procedure.  The benefits include less needle sticks, lab draws from the catheter, and the patient may be discharged home with the catheter. Risks include, but not limited to, infection, bleeding, blood clot (thrombus formation), and puncture of an artery; nerve damage and irregular heartbeat and possibility to perform a PICC exchange if needed/ordered by physician.  Alternatives to this procedure were also discussed.  [facility] Power PICC patient education guide, fact sheet on infection prevention and patient information card has been provided to patient /or left at bedside.    PICC/Midline Placement Documentation        Amy Woodard 08/26/2018, 3:46 PM

## 2018-08-26 NOTE — Progress Notes (Signed)
Subjective:  CC:  Amy Woodard is a 77 y.o. female  Hospital stay day 2,   right breast cellulitis  HPI: No issues overnight.  No pain.  ROS:  General: Denies weight loss, weight gain, fatigue, fevers, chills, and night sweats. Heart: Denies chest pain, palpitations, racing heart, irregular heartbeat, leg pain or swelling, and decreased activity tolerance. Respiratory: Denies breathing difficulty, shortness of breath, wheezing, cough, and sputum. GI: Denies change in appetite, heartburn, nausea, vomiting, constipation, diarrhea, and blood in stool. GU: Denies difficulty urinating, pain with urinating, urgency, frequency, blood in urine, and heavy menstrual bleeding.   Objective:      Temp:  [98.3 F (36.8 C)-99.1 F (37.3 C)] 99.1 F (37.3 C) (12/18 1148) Pulse Rate:  [79-86] 79 (12/18 1148) Resp:  [16-20] 16 (12/18 1148) BP: (130-140)/(61-68) 140/61 (12/18 1148) SpO2:  [97 %-98 %] 98 % (12/18 1148)     Height: 5\' 6"  (167.6 cm) Weight: 70 kg BMI (Calculated): 24.92   Intake/Output this shift:   Intake/Output Summary (Last 24 hours) at 08/26/2018 1432 Last data filed at 08/26/2018 1300 Gross per 24 hour  Intake 480 ml  Output 2 ml  Net 478 ml     Constitutional :  alert, cooperative, appears stated age and no distress  Skin: Cool and moist. Right breast erythema resolved, no palpable recurrence of seroma and non-tender.  Psychiatric: Normal affect, non-agitated, not confused       LABS:  CMP Latest Ref Rng & Units 08/26/2018 08/25/2018 08/17/2018  Glucose 70 - 99 mg/dL 138(H) 116(H) 122(H)  BUN 8 - 23 mg/dL 8 9 6(L)  Creatinine 0.44 - 1.00 mg/dL 0.60 0.52 0.44  Sodium 135 - 145 mmol/L 134(L) 140 141  Potassium 3.5 - 5.1 mmol/L 3.3(L) 3.6 4.1  Chloride 98 - 111 mmol/L 97(L) 103 102  CO2 22 - 32 mmol/L 30 32 31  Calcium 8.9 - 10.3 mg/dL 8.2(L) 8.6(L) 8.8(L)  Total Protein 6.5 - 8.1 g/dL - - -  Total Bilirubin 0.3 - 1.2 mg/dL - - -  Alkaline Phos 38 - 126 U/L - -  -  AST 15 - 41 U/L - - -  ALT 0 - 44 U/L - - -   CBC Latest Ref Rng & Units 08/26/2018 08/25/2018 08/17/2018  WBC 4.0 - 10.5 K/uL 6.3 6.1 6.7  Hemoglobin 12.0 - 15.0 g/dL 11.5(L) 11.4(L) 11.6(L)  Hematocrit 36.0 - 46.0 % 37.8 37.5 39.2  Platelets 150 - 400 K/uL 324 301 235    RADS: n/a Assessment:   Right breast celllulitis.  Improved. Appreciate ID consult.  Will continue to monitor for fluid reaccumulation and place PICC for outpt abx treatment for definitive coverage as needed.  Likely d/c home once PICC in place and IV abx arranged

## 2018-08-26 NOTE — Care Management (Signed)
RNCM consult for "Pt is sole caregiver for disabled husband. Needs assistance in home"  Reported that patient is independent of her on care. RNCM will follow up with patient and provide resources for her husband

## 2018-08-27 DIAGNOSIS — N611 Abscess of the breast and nipple: Secondary | ICD-10-CM

## 2018-08-27 DIAGNOSIS — C50911 Malignant neoplasm of unspecified site of right female breast: Secondary | ICD-10-CM

## 2018-08-27 DIAGNOSIS — C50912 Malignant neoplasm of unspecified site of left female breast: Secondary | ICD-10-CM

## 2018-08-27 DIAGNOSIS — Z170 Estrogen receptor positive status [ER+]: Secondary | ICD-10-CM

## 2018-08-27 LAB — BASIC METABOLIC PANEL
Anion gap: 4 — ABNORMAL LOW (ref 5–15)
BUN: 9 mg/dL (ref 8–23)
CO2: 34 mmol/L — ABNORMAL HIGH (ref 22–32)
Calcium: 8.8 mg/dL — ABNORMAL LOW (ref 8.9–10.3)
Chloride: 101 mmol/L (ref 98–111)
Creatinine, Ser: 0.55 mg/dL (ref 0.44–1.00)
GFR calc Af Amer: >60 mL/min (ref >60)
GFR calc non Af Amer: >60 mL/min (ref >60)
Glucose, Bld: 115 mg/dL — ABNORMAL HIGH (ref 70–99)
Potassium: 4.1 mmol/L (ref 3.5–5.1)
Sodium: 139 mmol/L (ref 135–145)

## 2018-08-27 LAB — CBC
HCT: 38.6 % (ref 36.0–46.0)
HEMOGLOBIN: 11.9 g/dL — AB (ref 12.0–15.0)
MCH: 27.7 pg (ref 26.0–34.0)
MCHC: 30.8 g/dL (ref 30.0–36.0)
MCV: 90 fL (ref 80.0–100.0)
PLATELETS: 308 10*3/uL (ref 150–400)
RBC: 4.29 MIL/uL (ref 3.87–5.11)
RDW: 13.1 % (ref 11.5–15.5)
WBC: 5.9 10*3/uL (ref 4.0–10.5)
nRBC: 0 % (ref 0.0–0.2)

## 2018-08-27 LAB — GLUCOSE, CAPILLARY
Glucose-Capillary: 122 mg/dL — ABNORMAL HIGH (ref 70–99)
Glucose-Capillary: 113 mg/dL — ABNORMAL HIGH (ref 70–99)

## 2018-08-27 LAB — MAGNESIUM: MAGNESIUM: 2.4 mg/dL (ref 1.7–2.4)

## 2018-08-27 LAB — PHOSPHORUS: PHOSPHORUS: 3.5 mg/dL (ref 2.5–4.6)

## 2018-08-27 MED ORDER — SODIUM CHLORIDE 0.9 % IV SOLN: 1.0000 g | INTRAVENOUS | 0 refills | Status: AC

## 2018-08-27 NOTE — Care Management Important Message (Signed)
Copy of signed IM left with patient in room.  

## 2018-08-27 NOTE — Progress Notes (Addendum)
Infectious Disease Long Term IV Antibiotic Orders EMBERLI BALLESTER Mar 30, 1941  Diagnosis: recurrent  Breast abscess  Culture results Serratia  LABS Lab Results  Component Value Date   CREATININE 0.55 08/27/2018   Lab Results  Component Value Date   WBC 5.9 08/27/2018   HGB 11.9 (L) 08/27/2018   HCT 38.6 08/27/2018   MCV 90.0 08/27/2018   PLT 308 08/27/2018   No results found for: ESRSEDRATE, POCTSEDRATE No results found for: CRP  Allergies:  Allergies  Allergen Reactions  . Sulfa Antibiotics Itching    Discharge antibiotics  Ceftriaxone 1 gram every       24        hours  PICC Care per protocol Labs weekly while on IV antibiotics -FAX weekly labs to [phone] CBC w diff   BUN and Cr LFTs  Planned duration of antibiotics 2 weeks  Stop date 09/11/18 Follow up clinic date Prior to abx stop date   Leonel Ramsay, MD

## 2018-08-27 NOTE — Consult Note (Signed)
Hematology/Oncology Consult note [facility] [phone] [phone]  Patient Care Team: Baxter Hire, MD as PCP - General (Internal Medicine)   Name of the patient: Amy Woodard  [id number]  12-09-1940   Date of visit: 08/28/18 REASON FOR COSULTATION:  Breast cancer History of presenting illness-  77 y.o. female with PMH listed at below who is currently admitted for management of recurrent breast infection/cellulitis. Patient has a history of breast cancer, status post lumpectomy.  Oncotype DX score 31, adjuvant chemotherapy was recommended.  She is in the process of getting Mediport placed. She has had a recent breast cellulitis which was treated  oral Augmentin antibiotics, not effective.  Admitted and treated with IV ceftriaxone and discharged on oral Cipro and Keflex.Culture grew serratia  .  Noted drainage again with increased erythema.  Currently admitted.  Status post ultrasound and aspiration of 15 cc fluid with some purulent towards the end of the aspiration.  Admitted and on IV ceftriaxone.  Denies any fever or chills. She has been evaluated by infectious disease Dr. Ola Spurr.  Was recommended to continue IV antibiotics for 2 weeks.  Oncology was consulted for discussion of adjuvant chemotherapy.   Review of Systems  Constitutional: Positive for fatigue. Negative for appetite change, chills and fever.  HENT:   Negative for hearing loss and voice change.   Eyes: Negative for eye problems.  Respiratory: Negative for chest tightness and cough.        Chronic shortness of breath  Cardiovascular: Negative for chest pain.  Gastrointestinal: Negative for abdominal distention, abdominal pain and blood in stool.  Endocrine: Negative for hot flashes.  Genitourinary: Negative for difficulty urinating and frequency.   Musculoskeletal: Negative for arthralgias.  Skin: Negative for itching and rash.  Neurological: Negative for  extremity weakness.  Hematological: Negative for adenopathy.  Psychiatric/Behavioral: Negative for confusion.    Allergies  Allergen Reactions  . Sulfa Antibiotics Itching    Patient Active Problem List   Diagnosis Date Noted  . Breast abscess 08/24/2018  . Abscess of right breast   . Cellulitis of right breast 08/14/2018  . Anemia 07/29/2018  . Hematoma, chest wall 07/10/2018  . Breast cancer ([place]) 07/03/2018  . Adenomatous polyp of descending colon 11/13/2017  . Nephrolithiasis 01/12/2015  . Hypothyroidism due to acquired atrophy of thyroid 12/22/2014  . RLS (restless legs syndrome) 12/22/2014  . Hypercholesterolemia 09/22/2014  . COPD (chronic obstructive pulmonary disease) ([place]) 03/24/2014  . Diabetes mellitus type 2, uncomplicated ([place]) 34/28/7681  . Rheumatoid arthritis, unspecified ([place]) 01/08/2014     Past Medical History:  Diagnosis Date  . Allergy   . Arthritis    rheumatoid arthritis  . Breast cancer ([place]) 05/2018   Right breast found 1st, then left breast with ductal ca  . Cellulitis of breast 05/2018   right breast cellulitis after first biopsy,  antibiotics prescribed  . COPD (chronic obstructive pulmonary disease) ([place])   . Diabetes mellitus without complication ([place])   . Dyspnea   . GERD (gastroesophageal reflux disease)    occasionally  . History of kidney stones    has one but not a problem  . Hypothyroidism   . Oxygen dependent    2liter nasal prong continuously     Past Surgical History:  Procedure Laterality Date  . APPENDECTOMY    . BACK SURGERY     lumbar. herniated disc . no metal  . BREAST BIOPSY Right 03/19/2018   INVASIVE MAMMARY CARCINOMA, NO SPECIAL TYPE  .  BREAST BIOPSY Left 06/01/2018   MRI bx, grade III invasive ductal carcinoma  . BREAST BIOPSY Right 06/01/2018   MRI bx of enhancing mass, path showed FOREIGN BODY GRANULOMATOUS RESPONSE   . 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 BREAST LUMPECTOMY  Right 07/03/2018   Procedure: RE-EXCISION OF RIGHT BREAST TISSUE MASS;  Surgeon: Benjamine Sprague, DO;  Location: [facility] ORS;  Service: General;  Laterality: Right;  . IRRIGATION AND DEBRIDEMENT HEMATOMA Left 07/10/2018   Procedure: IRRIGATION AND DEBRIDEMENT HEMATOMA LEFT AXILLARY;  Surgeon: Benjamine Sprague, DO;  Location: [facility] ORS;  Service: General;  Laterality: Left;  . PARTIAL MASTECTOMY WITH NEEDLE LOCALIZATION AND AXILLARY SENTINEL LYMPH NODE BX Right 04/16/2018   Procedure: PARTIAL MASTECTOMY WITH NEEDLE LOCALIZATION AND AXILLARY SENTINEL LYMPH NODE BX;  Surgeon: Benjamine Sprague, DO;  Location: [facility] ORS;  Service: General;  Laterality: Right;  . PARTIAL MASTECTOMY WITH NEEDLE LOCALIZATION AND AXILLARY SENTINEL LYMPH NODE BX Left 07/03/2018   Procedure: PARTIAL MASTECTOMY WITH NEEDLE LOCALIZATION AND SENTINEL LYMPH NODE BX;  Surgeon: Benjamine Sprague, DO;  Location: [facility] ORS;  Service: General;  Laterality: Left;    Social History   Socioeconomic History  . Marital status: Married    Spouse name: richard..disabled  . Number of children: Not on file  . Years of education: Not on file  . Highest education level: Not on file  Occupational History  . 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 Financial resource strain: Not hard at all  . Food insecurity:    Worry: Never true    Inability: Never true  . Transportation needs:    Medical: No    Non-medical: No  Tobacco Use  . Smoking status: Former Smoker    Types: Cigarettes    Last attempt to quit: 03/30/1993    Years since quitting: 25.4  . Smokeless tobacco: Never Used  Substance and Sexual Activity  . Alcohol use: No  . Drug use: Never  . Sexual activity: Not Currently  Lifestyle  . Physical activity:    Days per week: 7 days    Minutes per session: 30 min  . Stress: Not at all  Relationships  . Social connections:    Talks on phone: More than three times a week    Gets together: More than three times a  week    Attends religious service: 1 to 4 times per year    Active member of club or organization: No    Attends meetings of clubs or organizations: Never    Relationship status: Married  . Intimate partner violence:    Fear of current or ex partner: No    Emotionally abused: No    Physically abused: No    Forced sexual activity: No  Other Topics Concern  . Not on file  Social History Narrative  . Not on file     Family History  Problem Relation Age of Onset  . Clotting disorder Mother   . Melanoma Mother        deceased 28  . Lung cancer Sister        deceased 41s; smoker  . Alcohol abuse Father        deceased 62  . Cervical cancer Other        neice    No current facility-administered medications for this encounter.   Current Outpatient Medications:  .  anastrozole (ARIMIDEX) 1 MG tablet, Take 1 tablet (  1 mg total) by mouth daily., Disp: 30 tablet, Rfl: 3 .  azelastine (ASTELIN) 0.1 % nasal spray, Place 1 spray into both nostrils 2 (two) times daily. , Disp: , Rfl:  .  budesonide-formoterol (SYMBICORT) 160-4.5 MCG/ACT inhaler, Inhale 2 puffs into the lungs 2 (two) times daily. , Disp: , Rfl:  .  cefTRIAXone 1 g in sodium chloride 0.9 % 100 mL, Inject 1 g into the vein daily for 14 days., Disp: 14 Doses/Fill, Rfl: 0 .  folic acid (FOLVITE) 1 MG tablet, Take 1 mg by mouth daily., Disp: , Rfl:  .  glipiZIDE (GLUCOTROL) 5 MG tablet, Take 5 mg by mouth daily before breakfast., Disp: , Rfl:  .  hydroxychloroquine (PLAQUENIL) 200 MG tablet, Take 400 mg by mouth daily. , Disp: , Rfl:  .  levothyroxine (SYNTHROID, LEVOTHROID) 50 MCG tablet, Take 50 mcg by mouth daily before breakfast., Disp: , Rfl:  .  miconazole (MICONAZOLE 7) 2 % vaginal cream, Place 1 Applicatorful vaginally at bedtime., Disp: 45 g, Rfl: 0 .  omeprazole (PRILOSEC) 20 MG capsule, Take 20 mg by mouth daily as needed. , Disp: , Rfl:    Physical exam: ECOG  Vitals:   08/26/18 1148 08/26/18 2141 08/27/18 0523  08/27/18 1230  BP: 140/61 (!) 119/49 139/67 (!) 143/63  Pulse: 79 77 71 82  Resp: 16 18 18 16   Temp: 99.1 F (37.3 C) 98.3 F (36.8 C) 98.3 F (36.8 C) 97.7 F (36.5 C)  TempSrc: Oral  Oral Oral  SpO2: 98% 97% 98% 98%  Weight:      Height:       Physical Exam  Constitutional: She is oriented to person, place, and time. No distress.  HENT:  Head: Normocephalic and atraumatic.  Mouth/Throat: No oropharyngeal exudate.  Eyes: Pupils are equal, round, and reactive to light. EOM are normal. No scleral icterus.  Neck: Normal range of motion. Neck supple.  Cardiovascular: Normal rate and regular rhythm.  No murmur heard. Pulmonary/Chest: Effort normal. No respiratory distress. She has no rales. She exhibits no tenderness.  Breathing comfortably on nasal cannula oxygen.  Abdominal: Soft. She exhibits no distension. There is no abdominal tenderness.  Musculoskeletal: Normal range of motion.        General: No edema.  Neurological: She is alert and oriented to person, place, and time. No cranial nerve deficit. She exhibits normal muscle tone. Coordination normal.  Skin: Skin is warm and dry. No erythema.  Psychiatric: Affect normal.  Breast exam, right breast with mild induration minimal erythema.  No drainage.     CMP Latest Ref Rng & Units 08/27/2018  Glucose 70 - 99 mg/dL 115(H)  BUN 8 - 23 mg/dL 9  Creatinine 0.44 - 1.00 mg/dL 0.55  Sodium 135 - 145 mmol/L 139  Potassium 3.5 - 5.1 mmol/L 4.1  Chloride 98 - 111 mmol/L 101  CO2 22 - 32 mmol/L 34(H)  Calcium 8.9 - 10.3 mg/dL 8.8(L)  Total Protein 6.5 - 8.1 g/dL -  Total Bilirubin 0.3 - 1.2 mg/dL -  Alkaline Phos 38 - 126 U/L -  AST 15 - 41 U/L -  ALT 0 - 44 U/L -   CBC Latest Ref Rng & Units 08/27/2018  WBC 4.0 - 10.5 K/uL 5.9  Hemoglobin 12.0 - 15.0 g/dL 11.9(L)  Hematocrit 36.0 - 46.0 % 38.6  Platelets 150 - 400 K/uL 308   RADIOGRAPHIC STUDIES: I have personally reviewed the radiological images as listed and agreed  with the findings  in the report.  US Breast Ltd Uni Right Inc Axilla  Result Date: 08/24/2018 CLINICAL DATA:  Recent history of bilateral breast cancer. Post malignant right lumpectomy November 2019 with subsequent complex fluid collection at the lumpectomy site post aspiration 2 weeks ago. Previous ultrasound 08/14/2018 demonstrates persistent 6.6 cm complex fluid collection. Patient is nearing the end of a 12 day course of antibiotics. EXAM: ULTRASOUND OF THE RIGHT BREAST COMPARISON:  Previous exam(s). FINDINGS: On physical exam, there is erythema and induration over the upper central right breast. Targeted ultrasound is performed, showing persistent complex fluid collection at the 12 o'clock position of the right breast 2 cm from the nipple measuring 2.1 x 3.4 x 5.3 cm which is slightly smaller. IMPRESSION: Persistent but slightly smaller complex fluid collection at the 12 o'clock position of the right breast measuring 2.1 x 3.4 x 5.3 cm. This likely represents an infected postoperative fluid collection. RECOMMENDATION: Recommend aspiration of this probable infected postoperative right breast fluid collection and completion of patient's current antibiotic course. Recommend follow-up ultrasound in 2 weeks. I have discussed the findings and recommendations with the patient. Results were also provided in writing at the conclusion of the visit. If applicable, a reminder letter will be sent to the patient regarding the next appointment. BI-RADS CATEGORY  2: Benign. Right breast aspiration will be done today. Electronically Signed   By: Marin Olp M.D.   On: 08/24/2018 13:17   US Breast Ltd Uni Right Inc Axilla  Result Date: 08/14/2018 CLINICAL DATA:  Patient has a recent history of bilateral breast cancer. Patient status post lumpectomy of the right breast in November. Patient presents with diffuse redness of the right breast. Ultrasound is requested through the emergency room to assess for abscess. EXAM:  ULTRASOUND OF THE RIGHT BREAST COMPARISON:  Previous exam(s). FINDINGS: On physical exam, there is diffuse redness induration of the entire right breast. Targeted ultrasound is performed, showing minimal complicated fluid collection at the upper right breast measuring at least 6.6 cm. This is probably a surgical seroma. No other fluid collection is identified throughout right breast. IMPRESSION: Benign findings. RECOMMENDATION: Ultrasound findings are consistent most consistent with postsurgical seroma. There is diffuse induration and redness of the entire right breast. Antibiotic treatment is recommended. I have discussed the findings and recommendations with the patient. Results were also provided in writing at the conclusion of the visit. If applicable, a reminder letter will be sent to the patient regarding the next appointment. BI-RADS CATEGORY  2: Benign. Electronically Signed   By: Abelardo Diesel M.D.   On: 08/14/2018 16:49   US Breast Aspiration Right  Result Date: 08/24/2018 CLINICAL DATA:  Patient presents for aspiration of presumed infected postop fluid collection over the 12 o'clock position of the right breast. EXAM: ULTRASOUND GUIDED RIGHT BREAST CYST ASPIRATION COMPARISON:  Previous exams. PROCEDURE: Using sterile technique, 1% lidocaine, under direct ultrasound visualization, needle aspiration of the complex fluid collection at the 12 o'clock position was performed. 14 mL of cloudy sanguinous fluid was removed with suggestion of slight purulent material in the last 2-3 mL of aspirate. IMPRESSION: Ultrasound-guided aspiration of presumed infected postoperative fluid collection upper central right breast. No apparent complications. RECOMMENDATIONS: Recommend completion of patient's antibiotic course and follow-up ultrasound in 2 weeks. Electronically Signed   By: Marin Olp M.D.   On: 08/24/2018 13:19   [place] Ekg Site Rite  Result Date: 08/26/2018 If Site Rite image not attached, placement  could not be confirmed due to current cardiac  rhythm.  [place] Ekg Site Rite  Result Date: 08/26/2018 If Site Rite image not attached, placement could not be confirmed due to current cardiac rhythm.   Assessment and plan- Patient is a 77 y.o. 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  #Recurrent breast cellulitis, continue IV antibiotics.  ID recommendation noted. #Stage I breast cancer, ER PR positive, HER-2 negative, discussed with patient that although Oncotype DX showed recurrence rate of 31, ideally adjuvant chemotherapy should be done to decrease recurrence rate.  However, with multiple events happened postoperatively, including additional surgeries, recurrent breast infection, need of long-term antibiotics, patient is pretty much out of the window for adjuvant chemotherapy.  She has not received adjuvant radiation yet. Discussed with patient about plan of continuing antibiotics and focus on healing for now.  I will start patient on Arimidex 1 mg daily for adjuvant antiestrogen treatment.  This should not affect her wound healing or infection.  Rationale of aromatase inhibitor Arimidex and side effects including but not limited to hot flashes, bone pain, joint pain, mood swing, pathological fracture, etc. discussed with patient.  She can follow-up outpatient for further discussion.  She voices understanding and willing to proceed with starting adjuvant anti-estrogen treatment.  Obtain outpatient DEXA   Thank you for allowing me to participate in the care of this patient.  Total face to face encounter time for this patient visit was 70 min. >50% of the time was  spent in counseling and coordination of  care.    Earlie Server, MD, PhD Hematology Oncology [facility] at [facility] Pager- [phone] 08/28/2018

## 2018-08-27 NOTE — Progress Notes (Addendum)
Discharge order received. Patient is alert and oriented. Vital signs stable . No signs of acute distress. PICC line remains in placed.Discharge instructions given. Patient verbalized understanding. No other issues noted at this time.

## 2018-08-27 NOTE — Care Management Note (Signed)
Case Management Note  Patient Details  Name: Amy Woodard MRN: [id number] Date of Birth: 05/14/1941   Patient to discharge home today with IV antibiotics.  Patient lives at home with her husband.  She is is primary care giver and has multiple medical comorbidites . Patient provided PCS list as resources for care of her husband.  Patient states that her daughter and son live locally for support.  Patient normally is able to driver herself when she is feeling will, but states that she will most likely rely on her daughter at discharge.   Patient to discharge home with IV antibiotics.  Patient states that she uses [facility] for her home o2, and would like to use them for home health.  CMS Medicare.gov Compare Post Acute Care list reviewed with with patient.  Referral made to [facility] with [place].  Patient to receive todays dose in this hospital and start of care will be tomorrow with [place].  Subjective/Objective:                    Action/Plan:   Expected Discharge Date:  08/27/18               Expected Discharge Plan:  [place]  In-House Referral:     Discharge planning Services     Post Acute Care Choice:  Durable Medical Equipment, Home Health Choice offered to:  Patient  DME Arranged:  IV pump/equipment DME Agency:  [place] Arranged:  RN [facility] Agency:  [place]  Status of Service:  Completed, signed off  If discussed at [place] of Stay Meetings, dates discussed:    Additional Comments:  Beverly Sessions, RN 08/27/2018, 5:09 PM

## 2018-08-27 NOTE — Progress Notes (Signed)
[place] INFECTIOUS DISEASE PROGRESS NOTE Date of Admission:  08/24/2018     ID: Amy Woodard is a 77 y.o. female with  Recurrent breast abscess Active Problems:   Abscess of right breast   Breast abscess   Subjective: improving, less redness at breast site and less pain.  No fevers  ROS  Eleven systems are reviewed and negative except per hpi  Medications:  Antibiotics Given (last 72 hours)    Date/Time Action Medication Dose Rate   08/24/18 2004 New Bag/Given   cefTRIAXone (ROCEPHIN) 1 g in sodium chloride 0.9 % 100 mL IVPB 1 g 200 mL/hr   08/25/18 0858 Given   hydroxychloroquine (PLAQUENIL) tablet 400 mg 400 mg    08/25/18 1716 New Bag/Given   cefTRIAXone (ROCEPHIN) 1 g in sodium chloride 0.9 % 100 mL IVPB 1 g 200 mL/hr   08/26/18 0813 Given   hydroxychloroquine (PLAQUENIL) tablet 400 mg 400 mg    08/26/18 1813 New Bag/Given   cefTRIAXone (ROCEPHIN) 1 g in sodium chloride 0.9 % 100 mL IVPB 1 g 200 mL/hr   08/27/18 0830 Given   hydroxychloroquine (PLAQUENIL) tablet 400 mg 400 mg      . azelastine  1 spray Each Nare BID  . clotrimazole  1 Applicatorful Vaginal QHS  . enoxaparin (LOVENOX) injection  40 mg Subcutaneous Q24H  . folic acid  1 mg Oral Daily  . glipiZIDE  5 mg Oral QAC breakfast  . hydroxychloroquine  400 mg Oral Daily  . insulin aspart  0-9 Units Subcutaneous TID WC  . levothyroxine  50 mcg Oral Q0600  . mometasone-formoterol  2 puff Inhalation BID  . pantoprazole  40 mg Oral Daily  . sodium chloride flush  10-40 mL Intracatheter Q12H    Objective: Vital signs in last 24 hours: Temp:  [97.7 F (36.5 C)-98.3 F (36.8 C)] 97.7 F (36.5 C) (12/19 1230) Pulse Rate:  [71-82] 82 (12/19 1230) Resp:  [16-18] 16 (12/19 1230) BP: (119-143)/(49-67) 143/63 (12/19 1230) SpO2:  [97 %-98 %] 98 % (12/19 1230) Constitutional:  oriented to person, place, and time.  HENT: [place]/AT, PERRLA, no scleral icterus Mouth/Throat: Oropharynx is clear and moist. No  oropharyngeal exudate.  Cardiovascular: Normal rate, regular rhythm and normal heart sounds. Exam reveals no gallop and no friction rub.  Breast exam- R breast with mild induration, very minimal erythema, no drainage.  There is scar with some induration above the nipple from prior surgery. Pulmonary/Chest: Effort normal and breath sounds normal. No respiratory distress.  has no wheezes.  Neck = supple, no nuchal rigidity Abdominal: Soft. Bowel sounds are normal.  exhibits no distension. There is no tenderness.  Lymphadenopathy: no cervical adenopathy. No axillary adenopathy Neurological: alert and oriented to person, place, and time.  Skin: Skin is warm and dry. No rash noted except as breast exam above Psychiatric: a normal mood and affect.  behavior is normal.   Lab Results Recent Labs    08/26/18 0352 08/27/18 0553  WBC 6.3 5.9  HGB 11.5* 11.9*  HCT 37.8 38.6  NA 134* 139  K 3.3* 4.1  CL 97* 101  CO2 30 34*  BUN 8 9  CREATININE 0.60 0.55    Microbiology: Results for orders placed or performed during the hospital encounter of 08/13/18  Aerobic/Anaerobic Culture (surgical/deep wound)     Status: None   Collection Time: 08/13/18  9:34 AM  Result Value Ref Range Status   Specimen Description   Final    ABSCESS  BREAST RIGHT Performed at [facility], [place] [address], [place], [place] [address]    Special Requests   Final    Normal Performed at [facility], [address] [place], [place] [address]    Gram Stain   Final    FEW WBC PRESENT, PREDOMINANTLY PMN NO ORGANISMS SEEN    Culture   Final    FEW SERRATIA MARCESCENS NO ANAEROBES ISOLATED Performed at [facility], [place] [address], [place], [place] [address]    Report Status 08/18/2018 FINAL  Final   Organism ID, Bacteria SERRATIA MARCESCENS  Final      Susceptibility   Serratia marcescens - MIC*    CEFAZOLIN >=64 RESISTANT Resistant     CEFEPIME <=1 SENSITIVE Sensitive     CEFTAZIDIME <=1  SENSITIVE Sensitive     CEFTRIAXONE <=1 SENSITIVE Sensitive     CIPROFLOXACIN <=0.25 SENSITIVE Sensitive     GENTAMICIN <=1 SENSITIVE Sensitive     TRIMETH/SULFA <=20 SENSITIVE Sensitive     * FEW SERRATIA MARCESCENS    Studies/Results: [place] Ekg Site Rite  Result Date: 08/26/2018 If Site Rite image not attached, placement could not be confirmed due to current cardiac rhythm.  [place] Ekg Site Rite  Result Date: 08/26/2018 If Site Rite image not attached, placement could not be confirmed due to current cardiac rhythm.   Assessment/Plan: [name] is a 77 y.o. female with recurrent R breast cellulitis and small abscess sp drainage x 2 with cultures growing serratia. . She had surgery on the breast in August without incident.  She was treated for a few days with IV CTX then dced on oral cipro (for Serratia) and keflex for staph and strep.  However symptoms of pain and redness recurred after about 3-4 days.  US showed reaccumulation of fluid and drained 15 cc of serosanginous fluid.  I think overall this recurrence is less about antibiotic effectiveness and more about the recurrence of the fluid into the potential space and inability to sterilize that fluid collection.    12/18- doing better with less redness and tenderness but still some induration above nipple, no fevers 12/19 - continue to improve. PICC placed Recommendations Cont ceftriaxone for 2 weeks Discussed with surgery how to find a permanent solution to the fluid collection. They will monitor with USS as otpt and place drain if recurs.  For DC Today Thank you very much for the consult. Will follow with you.  Leonel Ramsay   08/27/2018, 12:55 PM

## 2018-08-28 ENCOUNTER — Other Ambulatory Visit: Payer: Self-pay | Admitting: Surgery

## 2018-08-28 ENCOUNTER — Other Ambulatory Visit: Payer: Self-pay

## 2018-08-28 ENCOUNTER — Other Ambulatory Visit: Payer: Self-pay | Admitting: Oncology

## 2018-08-28 ENCOUNTER — Inpatient Hospital Stay: Payer: PPO

## 2018-08-28 DIAGNOSIS — Z452 Encounter for adjustment and management of vascular access device: Secondary | ICD-10-CM | POA: Diagnosis not present

## 2018-08-28 DIAGNOSIS — Z9181 History of falling: Secondary | ICD-10-CM | POA: Diagnosis not present

## 2018-08-28 DIAGNOSIS — N611 Abscess of the breast and nipple: Secondary | ICD-10-CM

## 2018-08-28 DIAGNOSIS — Z792 Long term (current) use of antibiotics: Secondary | ICD-10-CM | POA: Diagnosis not present

## 2018-08-28 DIAGNOSIS — E119 Type 2 diabetes mellitus without complications: Secondary | ICD-10-CM | POA: Diagnosis not present

## 2018-08-28 DIAGNOSIS — Z9981 Dependence on supplemental oxygen: Secondary | ICD-10-CM | POA: Diagnosis not present

## 2018-08-28 MED ORDER — ANASTROZOLE 1 MG PO TABS: 1.0000 mg | ORAL_TABLET | Freq: Every day | ORAL | 3 refills | Status: DC

## 2018-08-28 NOTE — Discharge Summary (Signed)
Physician Discharge Summary  Patient ID: Amy Woodard MRN: [id number] DOB/AGE: [birth year] 76 y.o.  Admit date: 08/24/2018 Discharge date: 08/28/2018  Admission Diagnoses: Recurrent breast abscess right  Discharge Diagnoses:  Same as above  Discharged Condition: good  Hospital Course: Patient presented to clinic with recurrent right breast abscess.  Due to complicated hospital course previously, deemed it appropriate for patient to be readmitted for further IV antibiotic treatment.  Underwent IR guided drainage of recurrent breast abscess, infectious disease consulted and recommended long-term IV antibiotics.  She continued IV antibiotic therapy until PICC line can be placed and outpatient IV antibiotic arrangements were made.  Lab work remained steady and no additional signs of infection noted throughout her hospital stay.  At time of discharge the erythema and swelling has resolved patient was tolerating regular diet and no issues with pain control.  Consults: ID and IR  Discharge Exam: Blood pressure (!) 143/63, pulse 82, temperature 97.7 F (36.5 C), temperature source Oral, resp. rate 16, height 5\' 6"  (1.676 m), weight 70 kg, SpO2 98 %. General appearance: alert, cooperative and no distress Breasts: normal appearance, no masses or tenderness erythema and swelling prior to admission resolved  Disposition:  Discharge disposition: 01-Home or Self Care       Discharge Instructions    Discharge patient   Complete by:  As directed    Ok to do d/c once IV abx infusion and PICC line care is setup by home health   Discharge disposition:  01-Home or Self Care   Discharge patient date:  08/27/2018     Allergies as of 08/27/2018      Reactions   Sulfa Antibiotics Itching      Medication List    STOP taking these medications   acetaminophen 650 MG CR tablet Commonly known as:  TYLENOL   cephALEXin 500 MG capsule Commonly known as:  KEFLEX   ciprofloxacin 500 MG  tablet Commonly known as:  CIPRO     TAKE these medications   azelastine 0.1 % nasal spray Commonly known as:  ASTELIN Place 1 spray into both nostrils 2 (two) times daily.   cefTRIAXone 1 g in sodium chloride 0.9 % 100 mL Inject 1 g into the vein daily for 14 days.   folic acid 1 MG tablet Commonly known as:  FOLVITE Take 1 mg by mouth daily.   glipiZIDE 5 MG tablet Commonly known as:  GLUCOTROL Take 5 mg by mouth daily before breakfast.   hydroxychloroquine 200 MG tablet Commonly known as:  PLAQUENIL Take 400 mg by mouth daily.   levothyroxine 50 MCG tablet Commonly known as:  SYNTHROID, LEVOTHROID Take 50 mcg by mouth daily before breakfast.   miconazole 2 % vaginal cream Commonly known as:  MICONAZOLE 7 Place 1 Applicatorful vaginally at bedtime.   omeprazole 20 MG capsule Commonly known as:  PRILOSEC Take 20 mg by mouth daily as needed.   SYMBICORT 160-4.5 MCG/ACT inhaler Generic drug:  budesonide-formoterol Inhale 2 puffs into the lungs 2 (two) times daily.      Follow-up Information    [place], Raquell Richer, DO. Go on 09/11/2018.   Specialty:  Surgery Why:  Friday Jan 3rd at 10:30am for a follow-up Appointment  Contact information: [place] [address] [phone]            Total time spent arranging discharge was >51min. Signed: Benjamine Sprague 08/28/2018, 2:18 PM

## 2018-08-31 DIAGNOSIS — Z7689 Persons encountering health services in other specified circumstances: Secondary | ICD-10-CM | POA: Diagnosis not present

## 2018-09-03 ENCOUNTER — Other Ambulatory Visit: Payer: Self-pay

## 2018-09-03 NOTE — Patient Outreach (Signed)
[facility]) Care Management  09/03/2018  Amy Woodard Oct 16, 1940 [id number]   EMMI- General Discharge RED ON EMMI ALERT Day # 4 Date: 09/01/18 Red Alert Reason:  Sad/hopeless/anxious/empty? Yes    Outreach attempt: spoke with patient.  She reports she is doing well. Addressed red alert with patient. She states that it must have been a mistake. She denies any problems with feeling sad/hopeless/anxious/empty or feeling depressed.  Patient states she has a follow up appointment on Monday to have her breat checked and a follow up with her physician in January.  Patient denies any problems with transportation and has all medications.  Patient declines any needs at this time.    Plan: RN CM will close case.    Jone Baseman, RN, MSN [facility] Care Management Care Management Coordinator Direct Line [phone] Toll Free: [phone]  Fax: [phone]

## 2018-09-07 ENCOUNTER — Ambulatory Visit
Admission: RE | Admit: 2018-09-07 | Discharge: 2018-09-07 | Disposition: A | Discharge status: Home / Self Care | Payer: PPO | Source: Ambulatory Visit | Attending: Surgery | Admitting: Surgery

## 2018-09-07 DIAGNOSIS — N611 Abscess of the breast and nipple: Secondary | ICD-10-CM | POA: Diagnosis not present

## 2018-09-07 DIAGNOSIS — L7682 Other postprocedural complications of skin and subcutaneous tissue: Secondary | ICD-10-CM | POA: Diagnosis not present

## 2018-09-07 DIAGNOSIS — E119 Type 2 diabetes mellitus without complications: Secondary | ICD-10-CM | POA: Diagnosis not present

## 2018-09-08 ENCOUNTER — Ambulatory Visit: Payer: PPO | Attending: Infectious Diseases | Admitting: Infectious Diseases

## 2018-09-08 ENCOUNTER — Encounter: Payer: Self-pay | Admitting: Infectious Diseases

## 2018-09-08 VITALS — BP 159/85 | HR 88 | Temp 97.4°F | Wt 158.0 lb

## 2018-09-08 DIAGNOSIS — C50811 Malignant neoplasm of overlapping sites of right female breast: Secondary | ICD-10-CM

## 2018-09-08 DIAGNOSIS — Z79890 Hormone replacement therapy (postmenopausal): Secondary | ICD-10-CM

## 2018-09-08 DIAGNOSIS — B9689 Other specified bacterial agents as the cause of diseases classified elsewhere: Secondary | ICD-10-CM | POA: Diagnosis not present

## 2018-09-08 DIAGNOSIS — Z95828 Presence of other vascular implants and grafts: Secondary | ICD-10-CM | POA: Diagnosis not present

## 2018-09-08 DIAGNOSIS — E039 Hypothyroidism, unspecified: Secondary | ICD-10-CM | POA: Diagnosis not present

## 2018-09-08 DIAGNOSIS — J449 Chronic obstructive pulmonary disease, unspecified: Secondary | ICD-10-CM | POA: Diagnosis not present

## 2018-09-08 DIAGNOSIS — K219 Gastro-esophageal reflux disease without esophagitis: Secondary | ICD-10-CM | POA: Diagnosis not present

## 2018-09-08 DIAGNOSIS — C50312 Malignant neoplasm of lower-inner quadrant of left female breast: Secondary | ICD-10-CM | POA: Diagnosis not present

## 2018-09-08 DIAGNOSIS — E119 Type 2 diabetes mellitus without complications: Secondary | ICD-10-CM | POA: Diagnosis not present

## 2018-09-08 DIAGNOSIS — Z9981 Dependence on supplemental oxygen: Secondary | ICD-10-CM

## 2018-09-08 DIAGNOSIS — N611 Abscess of the breast and nipple: Secondary | ICD-10-CM

## 2018-09-08 DIAGNOSIS — M069 Rheumatoid arthritis, unspecified: Secondary | ICD-10-CM

## 2018-09-08 DIAGNOSIS — Z7984 Long term (current) use of oral hypoglycemic drugs: Secondary | ICD-10-CM

## 2018-09-08 DIAGNOSIS — Z170 Estrogen receptor positive status [ER+]: Secondary | ICD-10-CM

## 2018-09-08 DIAGNOSIS — Z87891 Personal history of nicotine dependence: Secondary | ICD-10-CM

## 2018-09-08 NOTE — Patient Instructions (Addendum)
You are here for follow up of a breast abscess due to serratia and you are on IV ceftriaxone which you will complete on 09/11/18. The repeat US done on 09/07/18 of the rt breast showed almost complete resolution of the fluid. Plan is to stop Iv antibiotic on 13/20 and then maybe repeat another Ultrasound in 1-2 weeks As you no longer are going to get chemotherapy the port placement is no longer an issue. If Port placement is being considered we definitely need to make sure that the infection is completely resolved as the risk of infecting the port is high. I Will discuss with Your oncologist and surgeon. As you have not started taking the medicine ( arimidex) given by your oncologist and you have concerns about the side effects it is best to discuss with her

## 2018-09-08 NOTE — Progress Notes (Signed)
NAME: Amy Woodard  DOB: [birth year]  MRN: [id number]  Date/Time: 09/08/2018 9:16 AM Subjective:  REASON FOR CONSULT: Follow-up after hospital discharge ? Amy Woodard is a 77 y.o. female with a history of COPD, GERD, rheumatoid arthritis recently diagnosed bilateral breast cancer followed by lumpectomy, infection at the site of the right breast surgery with Serratia marcescens on IV ceftriaxone is here for follow-up after hospital discharge.   Patient had a routine mammogram in July 2019 in which a mass was identified in the right breast she had also felt a sore spot there.  Then she underwent an ultrasound-guided core biopsy of the right breast mass on 03/19/2018 by Dr. Jimmye Norman.  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  This was followed by a surgery to the right breast on 04/16/2018 by Dr. Lysle Pearl.  It was a right needle localized breast lumpectomy with right axillary sentinel lymph node biopsy.  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  There was also a 3 mm nodule of invasive carcinoma located at the deep edge 1 mm away from the main tumor mass.  Suspect that the breast.  On 05/14/2018 she underwent an MRI of both breasts and there was some mass identified in the left breast lower inner quadrant of 1.2 cm x 1.4 cm.  A seroma of 7.1 cm x 3.3 cm was also noted in the right breast.  She underwent an MRI guided biopsy on the left side on 06/01/2018 and was an invasive ductal carcinoma. She had a second surgery on 07/03/2018 with needle localized left breast partial mastectomy and left axillary sentinel lymph node biopsy and reexcision of the deep margin of the right partial mastectomy site by Dr. Lysle Pearl.  The sentinel node biopsy was negative for malignancy on both sides the pathology of the left breast was 07/03/18 1)needle-localized left breast partial mastectomy.  2)left Axillary Sentinel Lymph  node biopsy 3)Reexcision of deep margin on right partial mastectomy site She then had a hematoma on the left axillary area and it was drained on 07/10/18 Incision and drainage of left axillary hematoma  She then went to [facility] for clearance for PORT on 12/4 and she was cleared- on 12/5 she went for port placement and they found that the rt breast was red and she then had aspiration of the fluid in the rt breast and was admitted- was started on ceftriaxone and later discharged on cipro and keflex as culture positive for serratia. She was seen by Dr.Fitzgerald then. After discharge on 12/9 she saw Dr.Sakai in his office on 12/16 and he noted redness of the breast and admitted her again. She had an [place] which showed Persistent but slightly smaller complex fluid collection at the 12 o'clock position of the right breast measuring 2.1 x 3.4 x 5.3 cm.. It was drained by IR and 15 cc of fluid was removed She was seen by Dr.Fitzgerald ID again and discharged home on Iv ceftriaxone to complete 2 weeks until 09/11/18.  Plan was to place a drain if the fluid re-accumulated again in the dead space A repeat US has been done on 12/30 and it shows near resolution of the fluid.  PT is no longer eligible for chemo and is not getting the port. Also radiation is a concern as per her pulmonologist. She was prescribed arimidex but has not started the medicine in fear of the side  effects. Her oncologist is not aware of her decision. Pt is doing well, with no pain or redness rt breast  no fever or chills. PICc site is okay  Past Medical History:  Diagnosis Date  . Allergy   . Arthritis    rheumatoid arthritis  . Breast cancer ([place]) 05/2018   Right breast found 1st, then left breast with ductal ca  . Cellulitis of breast 05/2018   right breast cellulitis after first biopsy,  antibiotics prescribed  . COPD (chronic obstructive pulmonary disease) ([place])   . Diabetes mellitus without complication ([place])   . Dyspnea   . GERD  (gastroesophageal reflux disease)    occasionally  . History of kidney stones    has one but not a problem  . Hypothyroidism   . Oxygen dependent    2liter nasal prong continuously    Past Surgical History:  Procedure Laterality Date  . APPENDECTOMY    . BACK SURGERY     lumbar. herniated disc . no metal  . BREAST BIOPSY Right 03/19/2018   INVASIVE MAMMARY CARCINOMA, NO SPECIAL TYPE  . BREAST BIOPSY Left 06/01/2018   MRI bx, grade III invasive ductal carcinoma  . BREAST BIOPSY Right 06/01/2018   MRI bx of enhancing mass, path showed FOREIGN BODY GRANULOMATOUS RESPONSE   . 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 BREAST LUMPECTOMY Right 07/03/2018   Procedure: RE-EXCISION OF RIGHT BREAST TISSUE MASS;  Surgeon: Benjamine Sprague, DO;  Location: [facility] ORS;  Service: General;  Laterality: Right;  . IRRIGATION AND DEBRIDEMENT HEMATOMA Left 07/10/2018   Procedure: IRRIGATION AND DEBRIDEMENT HEMATOMA LEFT AXILLARY;  Surgeon: Benjamine Sprague, DO;  Location: [facility] ORS;  Service: General;  Laterality: Left;  . PARTIAL MASTECTOMY WITH NEEDLE LOCALIZATION AND AXILLARY SENTINEL LYMPH NODE BX Right 04/16/2018   Procedure: PARTIAL MASTECTOMY WITH NEEDLE LOCALIZATION AND AXILLARY SENTINEL LYMPH NODE BX;  Surgeon: Benjamine Sprague, DO;  Location: [facility] ORS;  Service: General;  Laterality: Right;  . PARTIAL MASTECTOMY WITH NEEDLE LOCALIZATION AND AXILLARY SENTINEL LYMPH NODE BX Left 07/03/2018   Procedure: PARTIAL MASTECTOMY WITH NEEDLE LOCALIZATION AND SENTINEL LYMPH NODE BX;  Surgeon: Benjamine Sprague, DO;  Location: [facility] ORS;  Service: General;  Laterality: Left;    Social History   Socioeconomic History  . Marital status: Married    Spouse name: richard..disabled  . Number of children: Not on file  . Years of education: Not on file  . Highest education level: Not on file  Occupational History  . 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 Financial resource  strain: Not hard at all  . Food insecurity:    Worry: Never true    Inability: Never true  . Transportation needs:    Medical: No    Non-medical: No  Tobacco Use  . Smoking status: Former Smoker    Types: Cigarettes    Last attempt to quit: 03/30/1993    Years since quitting: 25.4  . Smokeless tobacco: Never Used  Substance and Sexual Activity  . Alcohol use: No  . Drug use: Never  . Sexual activity: Not Currently  Lifestyle  . Physical activity:    Days per week: 7 days    Minutes per session: 30 min  . Stress: Not at all  Relationships  . Social connections:    Talks on phone: More than three times a week    Gets together: More than three times a week  Attends religious service: 1 to 4 times per year    Active member of club or organization: No    Attends meetings of clubs or organizations: Never    Relationship status: Married  . Intimate partner violence:    Fear of current or ex partner: No    Emotionally abused: No    Physically abused: No    Forced sexual activity: No  Other Topics Concern  . Not on file  Social History Narrative  . Not on file    Family History  Problem Relation Age of Onset  . Clotting disorder Mother   . Melanoma Mother        deceased 3  . Lung cancer Sister        deceased 88s; smoker  . Alcohol abuse Father        deceased 4  . Cervical cancer Other        neice   Allergies  Allergen Reactions  . Sulfa Antibiotics Itching   ? Current Outpatient Medications  Medication Sig Dispense Refill  . anastrozole (ARIMIDEX) 1 MG tablet Take 1 tablet (1 mg total) by mouth daily. 30 tablet 3  . azelastine (ASTELIN) 0.1 % nasal spray Place 1 spray into both nostrils 2 (two) times daily.     . budesonide-formoterol (SYMBICORT) 160-4.5 MCG/ACT inhaler Inhale 2 puffs into the lungs 2 (two) times daily.     . cefTRIAXone 1 g in sodium chloride 0.9 % 100 mL Inject 1 g into the vein daily for 14 days. 14 Doses/Fill 0  . folic acid (FOLVITE)  1 MG tablet Take 1 mg by mouth daily.    [name] glipiZIDE (GLUCOTROL) 5 MG tablet Take 5 mg by mouth daily before breakfast.    . hydroxychloroquine (PLAQUENIL) 200 MG tablet Take 400 mg by mouth daily.     [name] levothyroxine (SYNTHROID, LEVOTHROID) 50 MCG tablet Take 50 mcg by mouth daily before breakfast.    . miconazole (MICONAZOLE 7) 2 % vaginal cream Place 1 Applicatorful vaginally at bedtime. 45 g 0  . omeprazole (PRILOSEC) 20 MG capsule Take 20 mg by mouth daily as needed.      No current facility-administered medications for this visit.      Abtx:  Anti-infectives (From admission, onward)   None      REVIEW OF SYSTEMS:  Const: negative fever, negative chills, negative weight loss Eyes: negative diplopia or visual changes, negative eye pain ENT: negative coryza, negative sore throat Resp: negative cough, hemoptysis, dyspnea, o2 dependent Cards: negative for chest pain, palpitations, lower extremity edema GU: negative for frequency, dysuria and hematuria GI: Negative for abdominal pain, diarrhea, bleeding, constipation Skin: negative for rash and pruritus Heme: negative for easy bruising and gum/nose bleeding MS: negative for myalgias, arthralgias, back pain and muscle weakness Neurolo:negative for headaches, dizziness, vertigo, memory problems  Psych: negative for feelings of anxiety, depression  Endocrine: negative for thyroid, diabetes Allergy/Immunology- negative for any medication or food allergies ?  Objective:  VITALS:  BP (!) 159/85 (BP Location: Right Leg, Patient Position: Sitting, Cuff Size: Normal)   Pulse 88   Temp (!) 97.4 F (36.3 C) (Oral)   Wt 158 lb (71.7 kg)   SpO2 92% Comment: on 2 liters of o2  BMI 25.50 kg/m  PHYSICAL EXAM:  General: Alert, cooperative, no distress, appears stated age. Nasal cannula with o2 Head: Normocephalic, without obvious abnormality, atraumatic. Eyes: Conjunctivae clear, anicteric sclerae. Pupils are equal ENT Nares normal. No  drainage or sinus  tenderness. Lips, mucosa, and tongue normal. No Thrush Neck: Supple, symmetrical, no adenopathy, thyroid: non tender no carotid bruit and no JVD. Back: No CVA tenderness. Lungs: b/l air entry Heart: Regular rate and rhythm, no murmur, rub or gallop. Abdomen: Soft, non-tender,not distended. Bowel sounds normal. No masses Extremities: atraumatic, no cyanosis. No edema. No clubbing. Left PICC clean Skin: No rashes or lesions. Or bruising Lymph: Cervical, supraclavicular normal. Neurologic: Grossly non-focal Breast- rt breast - retraction of skin and soft tissue at the scar site- no redness o tenderness- some induration due to underlying scar tissue- Axillary nodes not felt and no tenderness Left breast scar- no erythema or tenderness Left breast    Rt breast    Pertinent Labs Lab Results CBC    Component Value Date/Time   WBC 5.9 08/27/2018 0553   RBC 4.29 08/27/2018 0553   HGB 11.9 (L) 08/27/2018 0553   HGB 12.8 09/14/2014 0412   HCT 38.6 08/27/2018 0553   HCT 39.6 09/14/2014 0412   PLT 308 08/27/2018 0553   PLT 227 09/14/2014 0412   MCV 90.0 08/27/2018 0553   MCV 95 09/14/2014 0412   MCH 27.7 08/27/2018 0553   MCHC 30.8 08/27/2018 0553   RDW 13.1 08/27/2018 0553   RDW 15.9 (H) 09/14/2014 0412   LYMPHSABS 1.7 08/14/2018 1514   LYMPHSABS 0.9 (L) 09/13/2014 1910   MONOABS 0.7 08/14/2018 1514   MONOABS 2.0 (H) 09/13/2014 1910   EOSABS 0.4 08/14/2018 1514   EOSABS 0.0 09/13/2014 1910   BASOSABS 0.1 08/14/2018 1514   BASOSABS 0.0 09/13/2014 1910    CMP Latest Ref Rng & Units 08/27/2018 08/26/2018 08/25/2018  Glucose 70 - 99 mg/dL 115(H) 138(H) 116(H)  BUN 8 - 23 mg/dL 9 8 9   Creatinine 0.44 - 1.00 mg/dL 0.55 0.60 0.52  Sodium 135 - 145 mmol/L 139 134(L) 140  Potassium 3.5 - 5.1 mmol/L 4.1 3.3(L) 3.6  Chloride 98 - 111 mmol/L 101 97(L) 103  CO2 22 - 32 mmol/L 34(H) 30 32  Calcium 8.9 - 10.3 mg/dL 8.8(L) 8.2(L) 8.6(L)  Total Protein 6.5 - 8.1 g/dL  - - -  Total Bilirubin 0.3 - 1.2 mg/dL - - -  Alkaline Phos 38 - 126 U/L - - -  AST 15 - 41 U/L - - -  ALT 0 - 44 U/L - - -      Microbiology: Serratia marcescens culture in the wound  IMAGING RESULTS: ?reviewed [place] from 09/07/18 Impression/Recommendation ?77 y.o. female with a history of COPD, GERD, rheumatoid arthritis recently diagnosed bilateral breast cancer followed by lumpectomy, infection at the site of the right breast surgery with Serratia marcescens on IV ceftriaxone is here for follow-up after hospital discharge.    Bilateral Breast cancer  s/p lumpectomy - complicated by right  seroma which was infected with Serratia- pt never had a drain post surgery and likely the reason for repeated collection. Currently on Ceftriaxone and will complete 14 days on 09/11/18. Repeat ultrasound from 09/07/18 shows near resolution of the fluid. She will finish IV on 09/11/18. Will repeat another ultrasound in 1 week to make sure it has resolved completely. Will not place PORT unless the infection /seroma is completely resolved as high risk of port contamination with serratia. Anyway she says PORT and chemo is not in the picture anymore as she is out of the window for neoadjuvant chemo. She was given Arimidex as she is ER+ and PR+ but she has not taken it because of fear of the  side effects- I told her to talk to her oncologist and if that would make her  comfortable to get another opinion. ? ?COPD on home oxygen  DM- on glucotrol  Hypothyroidism on synthroid   ___________________________________________________ Discussed with patient in great detail  Note:  This document was prepared using Dragon voice recognition software and may include unintentional dictation errors.

## 2018-09-11 ENCOUNTER — Inpatient Hospital Stay: Payer: PPO | Admitting: Oncology

## 2018-09-11 ENCOUNTER — Inpatient Hospital Stay: Payer: PPO

## 2018-09-11 DIAGNOSIS — Z452 Encounter for adjustment and management of vascular access device: Secondary | ICD-10-CM | POA: Diagnosis not present

## 2018-09-11 DIAGNOSIS — Z9181 History of falling: Secondary | ICD-10-CM | POA: Diagnosis not present

## 2018-09-11 DIAGNOSIS — E119 Type 2 diabetes mellitus without complications: Secondary | ICD-10-CM | POA: Diagnosis not present

## 2018-09-11 DIAGNOSIS — N611 Abscess of the breast and nipple: Secondary | ICD-10-CM | POA: Diagnosis not present

## 2018-09-11 DIAGNOSIS — Z792 Long term (current) use of antibiotics: Secondary | ICD-10-CM | POA: Diagnosis not present

## 2018-09-11 DIAGNOSIS — D0510 Intraductal carcinoma in situ of unspecified breast: Secondary | ICD-10-CM | POA: Insufficient documentation

## 2018-09-11 DIAGNOSIS — Z9981 Dependence on supplemental oxygen: Secondary | ICD-10-CM | POA: Diagnosis not present

## 2018-09-15 DIAGNOSIS — E119 Type 2 diabetes mellitus without complications: Secondary | ICD-10-CM | POA: Diagnosis not present

## 2018-09-16 ENCOUNTER — Other Ambulatory Visit: Payer: Self-pay | Admitting: Pharmacist

## 2018-09-16 NOTE — Patient Outreach (Signed)
[facility]) Care Management  [place]   09/16/2018  CHANTAY WHITELOCK 1941-01-21 [id number]  Reason for referral: Medication Reconciliation Post Discharge  Referral source: [employer]  PMHx includes but not limited to: T2DM, COPD, RA, breast cancer  Outreach:  Successful telephone call with patient, however, she states that now is not a good time for a medication review. Scheduled phone call on Friday, 1/10 around 10 am.   Catie Darnelle Maffucci, PharmD PGY2 Ambulatory Care Pharmacy Resident, [place] Phone: [phone]

## 2018-09-17 ENCOUNTER — Inpatient Hospital Stay: Payer: PPO | Attending: Oncology

## 2018-09-17 ENCOUNTER — Inpatient Hospital Stay (HOSPITAL_BASED_OUTPATIENT_CLINIC_OR_DEPARTMENT_OTHER): Payer: PPO | Admitting: Oncology

## 2018-09-17 ENCOUNTER — Encounter: Payer: Self-pay | Admitting: Oncology

## 2018-09-17 ENCOUNTER — Other Ambulatory Visit: Payer: Self-pay

## 2018-09-17 VITALS — BP 167/81 | HR 82 | Temp 98.0°F | Resp 18 | Wt 160.3 lb

## 2018-09-17 DIAGNOSIS — C50811 Malignant neoplasm of overlapping sites of right female breast: Secondary | ICD-10-CM | POA: Insufficient documentation

## 2018-09-17 DIAGNOSIS — Z79811 Long term (current) use of aromatase inhibitors: Secondary | ICD-10-CM | POA: Diagnosis not present

## 2018-09-17 DIAGNOSIS — Z170 Estrogen receptor positive status [ER+]: Secondary | ICD-10-CM | POA: Diagnosis not present

## 2018-09-17 DIAGNOSIS — D0512 Intraductal carcinoma in situ of left breast: Secondary | ICD-10-CM | POA: Insufficient documentation

## 2018-09-17 DIAGNOSIS — C50919 Malignant neoplasm of unspecified site of unspecified female breast: Secondary | ICD-10-CM

## 2018-09-17 LAB — CBC WITH DIFFERENTIAL/PLATELET
Abs Immature Granulocytes: 0.02 10*3/uL (ref 0.00–0.07)
BASOS ABS: 0.1 10*3/uL (ref 0.0–0.1)
Basophils Relative: 2 %
Eosinophils Absolute: 0.4 10*3/uL (ref 0.0–0.5)
Eosinophils Relative: 6 %
HEMATOCRIT: 41.1 % (ref 36.0–46.0)
Hemoglobin: 12.5 g/dL (ref 12.0–15.0)
Immature Granulocytes: 0 %
LYMPHS ABS: 2.2 10*3/uL (ref 0.7–4.0)
Lymphocytes Relative: 30 %
MCH: 26.5 pg (ref 26.0–34.0)
MCHC: 30.4 g/dL (ref 30.0–36.0)
MCV: 87.1 fL (ref 80.0–100.0)
Monocytes Absolute: 0.5 10*3/uL (ref 0.1–1.0)
Monocytes Relative: 7 %
NRBC: 0 % (ref 0.0–0.2)
Neutro Abs: 4.1 10*3/uL (ref 1.7–7.7)
Neutrophils Relative %: 55 %
Platelets: 252 10*3/uL (ref 150–400)
RBC: 4.72 MIL/uL (ref 3.87–5.11)
RDW: 12.9 % (ref 11.5–15.5)
WBC: 7.4 10*3/uL (ref 4.0–10.5)

## 2018-09-17 LAB — COMPREHENSIVE METABOLIC PANEL
ALT: 17 U/L (ref 0–44)
AST: 19 U/L (ref 15–41)
Albumin: 4.2 g/dL (ref 3.5–5.0)
Alkaline Phosphatase: 50 U/L (ref 38–126)
Anion gap: 8 (ref 5–15)
BUN: 12 mg/dL (ref 8–23)
CO2: 35 mmol/L — ABNORMAL HIGH (ref 22–32)
Calcium: 9.4 mg/dL (ref 8.9–10.3)
Chloride: 98 mmol/L (ref 98–111)
Creatinine, Ser: 0.73 mg/dL (ref 0.44–1.00)
GFR calc Af Amer: >60 mL/min (ref >60)
GFR calc non Af Amer: >60 mL/min (ref >60)
Glucose, Bld: 121 mg/dL — ABNORMAL HIGH (ref 70–99)
Potassium: 4 mmol/L (ref 3.5–5.1)
Sodium: 141 mmol/L (ref 135–145)
Total Bilirubin: 0.4 mg/dL (ref 0.3–1.2)
Total Protein: 7.2 g/dL (ref 6.5–8.1)

## 2018-09-17 NOTE — Progress Notes (Signed)
Hematology/Oncology follow up  note [facility] Telephone:([phone]) [phone] [phone]   Patient Care Team: Baxter Hire, MD as PCP - General (Internal Medicine) De Hollingshead, [facility] as [place] Management (Pharmacist) Breast Surgeon Dr.Sakai.   CHIEF COMPLAINTS/REASON FOR VISIT:  Evaluation of breast cancer  HISTORY OF PRESENTING ILLNESS:  Amy Woodard is a  78 y.o.  female with PMH listed below who was referred to me for evaluation of newly diagnosed breast cancer. Patient had mammogram and ultrasound on 03/19/2018 which showed right breast 12:00 1.6 x 1.3 x 1.5 cm mass, no radiographically enlarged adenopathy.  Patient also report feeling a mass for the past couple of months. Biopsy pathology showed: Invasive mammary carcinoma, no special type, grade 3, DCIS present, lymphovascular invasion not identified, ER PR 90% positive, HER-2 negative  Nipple discharge: Denies Family history: Mother had melanoma, passed away at age of 63, sister had cervical cancer.  Sister with colon cancer, maternal grandmother sister OCP use: denies.  Estrogen and progesterone therapy: denies History of radiation to chest: denies.  Previous breast surgery: Denies  She has a complicated course of rest cancer diagnosis and surgery. # 04/16/2018 status post right breast mass lumpectomy and sentinel lymph node biopsy.  Pathology showed invasive mammary carcinoma 1.7 cm, DCIS, high-grade with extensive involvement of lobules.  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  Sentinel lymph node biopsy negative. pT1c pN0. ER >90%, PR >90%, HER 2 negative.   05/14/2018 patient underwent MRI breast bilaterally to determined extent of positive deep margin/lobular type breast cancer. MRI breast showed an irregular 1.4 cm mass in the lower inner quadrant of the left breast. Abnormal non-mass enhancement in the upper outer right  breast, axillary tail region could be secondary to postsurgical changes, malignancy cannot be excluded. 06/05/2018 left breast mass biopsy showed invasive ductal carcinoma, grade 3, this [facility] NJaquita Folds ER 100%, PR 50%, not enough tissue for HER 2 analysis.  07/03/2018 patient underwent right breast deep margin lumpectomy/reexcision: Pathology showed negative for residual malignancy. Left breast needle localized lumpectomy showed negative for residual malignancy.  Sentinel lymph node negative In addition the outer slides on this patient's left breast biopsy were requested and reviewed by Dr. RReuel Derby  Dr. RReuel Derbyfeels the patient's biopsy showed high-grade DCIS with focal areas suspicious for invasion.  Definitive invasive carcinoma is not identified.  DCIS is present in both outside blocks and slides from biopsy and measures 7 mm and 6 mm respectively.  pTis pN0 Patient case have been discussed on breast tumor board multiple times with reviewing of images and pathology findings. Left breast MRI did show a 1.4 cm irregularity, and pathology only 7 mm and a 6 mm DCIS were found at the initial left breast biopsy.  Final left breast lumpectomy did not contain clips which was placed with MRI guidance at biopsy..  # 07/03/2018-07/06/2018 admitted for pain control, sentinel lymph node biopsy site hematoma,  #07/10/2018 - 07/14/2018 Patient admitted due to acute on chronic anemia due to hematoma.  She was admitted for evacuation of postoperative left axillary sentinel lymph node biopsy site hematoma.  Status post PRBC transfusion during her admission. # 08/14/2018-08/17/2018 readmitted due to breast cellulitis despite outpatient drainage and oral antibiotics.  # 08/24/2018- 08/28/2018 readmitted due to recurrent right breast abscess. She was placed on IV antibiotics with Ceftriaxone via picc line.  #A repeat [place]  been done on 09/07/2018 and it shows near  resolution of the fluid. # #Finished IV Ceftriaxone on 09/11/2018.    Patient has a history of chronic respiratory failure/ COPD, on home oxygen.  INTERVAL HISTORY Amy Woodard is a 78 y.o. female who has above history reviewed by me today presents for follow up visit for management of breast cancer.  Reports feel better today. During her last admission,I discussed with her about omitting chemotherapy due to delay secondary to her complicated post- surgery course. I recommend her to start anti estrogen treatment with Arimidex 6m daily. Rationale ans side effects were discussed with her and Rx was sent to her pharmacy.  Patient completed IV antibiotic course last week. Today she has no breast concerns.  She did not start anti estrogen treatment and wants to complete antibiotics first.   She follows up with Dr. FRaul Del Per patient, Dr.Fleming did not clear her for RT due to severe COPD.   Review of Systems  Constitutional: Positive for malaise/fatigue. Negative for chills, fever and weight loss.  HENT: Negative for nosebleeds and sore throat.   Eyes: Negative for double vision, photophobia and redness.  Respiratory: Positive for shortness of breath. Negative for cough and wheezing.   Cardiovascular: Negative for chest pain, palpitations, orthopnea and leg swelling.  Gastrointestinal: Negative for abdominal pain, blood in stool, nausea and vomiting.  Genitourinary: Negative for dysuria.  Musculoskeletal: Negative for myalgias.       Left axillary soreness  Skin: Negative for itching and rash.  Neurological: Negative for dizziness, tingling and tremors.  Endo/Heme/Allergies: Negative for environmental allergies. Does not bruise/bleed easily.  Psychiatric/Behavioral: Negative for depression and hallucinations.    MEDICAL HISTORY:  Past Medical History:  Diagnosis Date  . Allergy   . Arthritis    rheumatoid arthritis  . Breast cancer ([place] [employer] 05/[place]   Right breast found 1st, then  left breast with ductal ca  . Cellulitis of breast 05/2018   right breast cellulitis after first biopsy,  antibiotics prescribed  . COPD (chronic obstructive pulmonary disease) ([place]   . [place] mellitus [place] complication [place]   . Dyspnea   . GERD (gastroesophageal reflux disease)    occasionally  . History of kidney stones    has one but not a problem  . Hypothyroidism   . Oxygen dependent    2liter nasal prong continuously    SURGICAL HISTORY: Past Surgical History:  Procedure Laterality Date  . APPENDECTOMY    . BACK SURGERY     lumbar. herniated disc . no metal  . BREAST BIOPSY Right 03/19/2018   INVASIVE MAMMARY CARCINOMA, NO SPECIAL TYPE  . BREAST BIOPSY Left 06/01/2018   MRI bx, grade III invasive ductal carcinoma  . BREAST BIOPSY Right 06/01/2018   MRI bx of enhancing mass, path showed FOREIGN BODY GRANULOMATOUS RESPONSE   . 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 BREAST LUMPECTOMY Right 07/03/2018   Procedure: RE-EXCISION OF RIGHT BREAST TISSUE MASS;  Surgeon: SBenjamine Sprague DO;  Location: [facility] ORS;  Service: General;  Laterality: Right;  . IRRIGATION AND DEBRIDEMENT HEMATOMA Left 07/10/2018   Procedure: IRRIGATION AND DEBRIDEMENT HEMATOMA LEFT AXILLARY;  Surgeon: SBenjamine Sprague DO;  Location: [facility] ORS;  Service: General;  Laterality: Left;  . PARTIAL MASTECTOMY WITH NEEDLE LOCALIZATION AND AXILLARY SENTINEL LYMPH NODE BX Right 04/16/2018   Procedure: PARTIAL MASTECTOMY WITH NEEDLE LOCALIZATION AND AXILLARY SENTINEL LYMPH NODE BX;  Surgeon: SBenjamine Sprague DO;  Location: [facility] ORS;  Service: General;  Laterality: Right;  . PARTIAL MASTECTOMY WITH NEEDLE LOCALIZATION AND AXILLARY SENTINEL LYMPH NODE BX Left 07/03/2018   Procedure: PARTIAL MASTECTOMY WITH NEEDLE LOCALIZATION AND SENTINEL LYMPH NODE BX;  Surgeon: Benjamine Sprague, DO;  Location: [facility] ORS;  Service: General;  Laterality: Left;    SOCIAL HISTORY: Social History   Socioeconomic History  .  Marital status: Married    Spouse name: richard..disabled  . Number of children: Not on file  . Years of education: Not on file  . Highest education level: Not on file  Occupational History  . 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 Financial resource strain: Not hard at all  . Food insecurity:    Worry: Never true    Inability: Never true  . Transportation needs:    Medical: No    Non-medical: No  Tobacco Use  . Smoking status: Former Smoker    Types: Cigarettes    Last attempt to quit: 03/30/1993    Years since quitting: 25.4  . Smokeless tobacco: Never Used  Substance and Sexual Activity  . Alcohol use: No  . Drug use: Never  . Sexual activity: Not Currently  Lifestyle  . Physical activity:    Days per week: 7 days    Minutes per session: 30 min  . Stress: Not at all  Relationships  . Social connections:    Talks on phone: More than three times a week    Gets together: More than three times a week    Attends religious service: 1 to 4 times per year    Active member of club or organization: No    Attends meetings of clubs or organizations: Never    Relationship status: Married  . Intimate partner violence:    Fear of current or ex partner: No    Emotionally abused: No    Physically abused: No    Forced sexual activity: No  Other Topics Concern  . Not on file  Social History Narrative  . Not on file    FAMILY HISTORY: Family History  Problem Relation Age of Onset  . Clotting disorder Mother   . Melanoma Mother        deceased 9  . Lung cancer Sister        deceased 54s; smoker  . Alcohol abuse Father        deceased 68  . Cervical cancer Other        neice    ALLERGIES:  is allergic to sulfa antibiotics.  MEDICATIONS:  Current Outpatient Medications  Medication Sig Dispense Refill  . azelastine (ASTELIN) 0.1 % nasal spray Place 1 spray into both nostrils 2 (two) times daily.     . budesonide-formoterol (SYMBICORT)  160-4.5 MCG/ACT inhaler Inhale 2 puffs into the lungs 2 (two) times daily.     . folic acid (FOLVITE) 1 MG tablet Take 1 mg by mouth daily.    [name] glipiZIDE (GLUCOTROL) 5 MG tablet Take 5 mg by mouth daily before breakfast.    . hydroxychloroquine (PLAQUENIL) 200 MG tablet Take 400 mg by mouth daily.     [name] levothyroxine (SYNTHROID, LEVOTHROID) 50 MCG tablet Take 50 mcg by mouth daily before breakfast.    . omeprazole (PRILOSEC) 20 MG capsule Take 20 mg by mouth daily as needed.     [name] anastrozole (ARIMIDEX) 1 MG tablet Take 1 tablet (1 mg total) by mouth daily. (Patient not taking: Reported on 09/17/2018)  30 tablet 3  . miconazole (MICONAZOLE 7) 2 % vaginal cream Place 1 Applicatorful vaginally at bedtime. (Patient not taking: Reported on 09/17/2018) 45 g 0   No current facility-administered medications for this visit.      PHYSICAL EXAMINATION: ECOG PERFORMANCE STATUS: 1 - Symptomatic but completely ambulatory Vitals:   09/17/18 1054  BP: (!) 167/81  Pulse: 82  Resp: 18  Temp: 98 F (36.7 C)  SpO2: 94%   Filed Weights   09/17/18 1054  Weight: 160 lb 4.8 oz (72.7 kg)    Physical Exam Constitutional:      General: She is not in acute distress.    Comments: On home oxygen  HENT:     Head: Normocephalic and atraumatic.  Eyes:     General: No scleral icterus.    Conjunctiva/sclera: Conjunctivae normal.     Pupils: Pupils are equal, round, and reactive to light.  Neck:     Musculoskeletal: Normal range of motion and neck supple.  Cardiovascular:     Rate and Rhythm: Normal rate and regular rhythm.     Heart sounds: Normal heart sounds.  Pulmonary:     Effort: Pulmonary effort is normal. No respiratory distress.     Breath sounds: No wheezing.  Abdominal:     General: Bowel sounds are normal. There is no distension.     Palpations: Abdomen is soft. There is no mass.     Tenderness: There is no abdominal tenderness.  Musculoskeletal: Normal range of motion.        General: No  deformity.  Lymphadenopathy:     Cervical: No cervical adenopathy.  Skin:    General: Skin is warm and dry.     Findings: No erythema or rash.  Neurological:     Mental Status: She is alert and oriented to person, place, and time.     Cranial Nerves: No cranial nerve deficit.     Coordination: Coordination normal.  Psychiatric:        Behavior: Behavior normal.        Thought Content: Thought content normal.   .     LABORATORY DATA:  I have reviewed the data as listed Lab Results  Component Value Date   WBC 7.4 09/17/2018   HGB 12.5 09/17/2018   HCT 41.1 09/17/2018   MCV 87.1 09/17/2018   PLT 252 09/17/2018   Recent Labs    03/30/18 1551  08/26/18 0352 08/27/18 0553 09/17/18 1032  NA 142   < > 134* 139 141  K 4.2   < > 3.3* 4.1 4.0  CL 99   < > 97* 101 98  CO2 37*   < > 30 34* 35*  GLUCOSE 100*   < > 138* 115* 121*  BUN 15   < > 8 9 12   CREATININE 0.56   < > 0.60 0.55 0.73  CALCIUM 9.6   < > 8.2* 8.8* 9.4  GFRNONAA >60   < > >60 >60 >60  GFRAA >60   < > >60 >60 >60  PROT 7.8  --   --   --  7.2  ALBUMIN 4.5  --   --   --  4.2  AST 21  --   --   --  19  ALT 19  --   --   --  17  ALKPHOS 51  --   --   --  50  BILITOT 0.4  --   --   --  0.4   < > =  values in this interval not displayed.   Iron/TIBC/Ferritin/ %Sat No results found for: IRON, TIBC, FERRITIN, IRONPCTSAT      ASSESSMENT & PLAN:  1. Invasive carcinoma of breast ([place])   2. Aromatase inhibitor use   3. Estrogen receptor positive status (ER+)   4. Ductal carcinoma in situ (DCIS) of left breast    #Right breast pT1c pN0, reexcision did not reveal any residual disease.. Oncotype DX showed recurrence rate of 31, patient has 15% benefit from adjuvant chemotherapy.  However, with multiple events happened postoperatively, including additional surgeries, recurrent breast infection, need of weeks of antibiotics, benefit of starting delayed ajuvant chemotherapy is not clear.  Again, I had a discussion  about the rationale of anti-estrogen treatment with Arimidex.  Rationale of using aromatase inhibitor -Arimidex  discussed with patient.  Side effects of Arimidex including but not limited to hot flush, joint pain, fatigue, mood swing, osteoporosis discussed with patient.Arimidex should not interfere with  Patient voices understanding and willing to proceed.   #obtain bone density for evaluation.  #Left breast high-grade DCIS, Per discussion with pathology Dr.Rubinas, in her opinion,  left breast mass biopsy actually showed high grade DCIS, definitive invasive carcinoma is not identified. Left lumpectomy did not show any residual disease, however clips were not found in the specimen.  Per my discussion with surgeon Dr.Sakai, even clips are found on repeat image, re-excision is not feasible and the next step will be mastectomy.  Will allow patient to completely heal and plan obtain mammogram at next visit.   # Anemia, due to hematoma, completely recovered.  We spent sufficient time to discuss many aspect of care, questions were answered to patient's satisfaction.  Orders Placed This Encounter  Procedures  . DG Bone Density    Standing Status:   Future    Standing Expiration Date:   11/17/2019    Order Specific Question:   Reason for Exam (SYMPTOM  OR DIAGNOSIS REQUIRED)    Answer:   aromatase inhibitor use    Order Specific Question:   Preferred imaging location?    Answer:   [facility]  . CBC with Differential/Platelet    Standing Status:   Future    Standing Expiration Date:   09/18/2019  . Comprehensive metabolic panel    Standing Status:   Future    Standing Expiration Date:   09/18/2019    All questions were answered. The patient knows to call the clinic with any problems questions or concerns.  Return of visit: 1 month to assess tolerability on Arimidex. Total face to face encounter time for this patient visit was 25 min. >50% of the time was  spent in counseling and  coordination of care.      Earlie Server, MD, PhD Hematology Oncology [facility] Pager- [phone] 09/17/2018

## 2018-09-17 NOTE — Progress Notes (Signed)
Patient here for follow up. Completed antibiotic course last week.

## 2018-09-18 ENCOUNTER — Ambulatory Visit: Payer: Self-pay | Admitting: Pharmacist

## 2018-09-22 ENCOUNTER — Other Ambulatory Visit: Payer: Self-pay | Admitting: Pharmacist

## 2018-09-22 DIAGNOSIS — E7800 Pure hypercholesterolemia, unspecified: Secondary | ICD-10-CM | POA: Diagnosis not present

## 2018-09-22 DIAGNOSIS — J9611 Chronic respiratory failure with hypoxia: Secondary | ICD-10-CM | POA: Diagnosis not present

## 2018-09-22 DIAGNOSIS — E119 Type 2 diabetes mellitus without complications: Secondary | ICD-10-CM | POA: Diagnosis not present

## 2018-09-22 DIAGNOSIS — E034 Atrophy of thyroid (acquired): Secondary | ICD-10-CM | POA: Diagnosis not present

## 2018-09-22 DIAGNOSIS — J449 Chronic obstructive pulmonary disease, unspecified: Secondary | ICD-10-CM | POA: Diagnosis not present

## 2018-09-22 DIAGNOSIS — I10 Essential (primary) hypertension: Secondary | ICD-10-CM | POA: Diagnosis not present

## 2018-09-22 NOTE — Patient Outreach (Signed)
[facility]) Care Management  [place]   09/22/2018  Amy Woodard 1941/07/22 [id number]  Reason for referral: Medication Reconciliation Post Discharge  Referral source: [employer]  PMHx includes but not limited to: COPD, RA, breast cancer, T2Dm  Outreach:  Successful home visit with patient.  HIPAA identifiers verified.   Subjective:  Patient recent admitted to [facility] for recurrent right breast abscess. Discharged with IV antibiotic therapy that was completed last week. She was to start on anastrazole, but oncology note states that she wanted to complete antibiotic therapy first before starting.   Today, she denies any concerns and denies any questions about her medications.   She notes that her glucometer is not currently working, but that she is working on getting a new one and does not need assistance. She notes that her blood sugars had been "elevated" when she was last checking, but this was during her infection. She denies any issues with low blood sugars.    Objective: Lab Results  Component Value Date   CREATININE 0.73 09/17/2018   CREATININE 0.55 08/27/2018   CREATININE 0.60 08/26/2018    A1c: 6.0% (09/15/2018, CareEverywhere)   Lipid Panel  Total Cholesterol: 170 TG: 124 HDL 27.5 LDL 118 (08/11/2017, CareEverywhere)  BP Readings from Last 3 Encounters:  09/17/18 (!) 167/81  09/08/18 (!) 159/85  08/27/18 (!) 143/63    Allergies  Allergen Reactions  . Sulfa Antibiotics Itching    Medications Reviewed Today    Reviewed by De Hollingshead, [facility] (Pharmacist) on 09/22/18 at 1043  Med List Status: <None>  Medication Order Taking? Sig Documenting Provider Last Dose Status Informant  anastrozole (ARIMIDEX) 1 MG tablet [id number] Yes Take 1 tablet (1 mg total) by mouth daily. Earlie Server, MD Taking Active   azelastine (ASTELIN) 0.1 % nasal spray [id number] Yes Place 1 spray into both nostrils 2 (two) times daily.  [provider] Taking Active Self  budesonide-formoterol (SYMBICORT) 160-4.5 MCG/ACT inhaler [id number] Yes Inhale 2 puffs into the lungs 2 (two) times daily.  [provider] Taking Active Self           Med Note (Hart   Tue Sep 22, 2018 10:40 AM) Using as needed  calcium-vitamin D (OSCAL WITH D) 250-125 MG-UNIT tablet [id number] Yes Take 1 tablet by mouth daily. [provider] Taking Active   folic acid (FOLVITE) 1 MG tablet [id number] Yes Take 1 mg by mouth daily. [provider] Taking Active Self  glipiZIDE (GLUCOTROL) 5 MG tablet [id number] Yes Take 5 mg by mouth daily before breakfast. [provider] Taking Active Self  hydroxychloroquine (PLAQUENIL) 200 MG tablet [id number] Yes Take 400 mg by mouth daily.  [provider] Taking Active Self           Med Note (MENDENHALL, Donalynn Furlong   Wed Aug 19, 2018  2:13 PM)    levothyroxine (SYNTHROID, LEVOTHROID) 50 MCG tablet [id number] Yes Take 50 mcg by mouth daily before breakfast. [provider] Taking Active Self  omeprazole (PRILOSEC) 20 MG capsule [id number] Yes Take 20 mg by mouth daily as needed.  [provider] Taking Active           Assessment: Date Discharged from Hospital: 08/27/2018 Date Medication Reconciliation Performed: 09/22/2018  Medications:  New at Discharge: . Ceftriaxone (completed)  Discontinued at Discharge:   Acetaminophen  Ciprofloxacin  Cephalexin  Patient was recently discharged from hospital and all medications have been reviewed.  Drugs sorted  by system:  Pulmonary/Allergy: Symbicort, azelastine nasal spray  Gastrointestinal: omeprazole  Endocrine: levothyroxine, glipizide  Infectious Diseases:  Oncology: anastrazole  Vitamins/Minerals/Supplements: calcium + vitamin D, folic acid  Miscellaneous: hydroxychloroquine  Medication Findings: - Counseled patient to continue to check BG once meter is fixed and contact  provider with any development of hypoglycemia. Given patient's extremely well controlled A1c, glipizide therapy may eventually need to be decreased or discontinued to prevent hypoglycemia.  Plan: - Will route message to patient's PCP for notification - Patient has my contact information for any future questions or concerns  Catie Darnelle Maffucci, PharmD PGY2 Ambulatory Care Pharmacy Resident, [place] Phone: [phone]

## 2018-10-02 ENCOUNTER — Other Ambulatory Visit: Payer: Self-pay | Admitting: Pharmacist

## 2018-10-02 NOTE — Patient Outreach (Signed)
[facility]) Care Management  10/02/2018  ELYSIA Woodard December 23, 1940 [id number]   Outreach call to Amy Woodard regarding her request for follow up from the [facility] Medication Adherence Campaign, which had outreached to her regarding adherence to glipizide ER. Speak with patient, but she reports that now is not a good time to talk as she is currently baking a cake.    PLAN  Will call to follow up with patient next week as requested.  Harlow Asa, PharmD, [place] Management [phone]

## 2018-10-05 ENCOUNTER — Other Ambulatory Visit: Payer: Self-pay | Admitting: Pharmacist

## 2018-10-05 NOTE — Patient Outreach (Signed)
Amy Woodard) Care Management  10/05/2018  Amy Woodard 08-04-1941 [id number]   Outreach call to [name] regarding her request for follow up from the [facility] Medication Adherence Campaign. HIPAA identifiers verified and verbal consent received.  [name] reports that she has been taking her glipizide ER 5 mg once daily as directed. Denies any missed doses or barriers to adherence. Note that per chart, patient requested and PCP sent in a new 90 day prescription for this medication on 09/28/2018. Patient reports that she uses a weekly pillbox to organize her medications.   Patient reports that she has not been checking her blood sugar recently because her meter needs a new battery. Note that [place] Resident Catie Darnelle Maffucci spoke with patient about this issue with her meter on 09/22/2018 and counseled about signs of hypoglycemia. Offer to call patient's PCP today to request a new prescription for a new One Touch meter. Patient declines and states that she will go to the pharmacy today to get a new battery and, if the meter does not work with the new battery, she will call her PCP to request the new meter and supplies prescriptions.  Patient denies any further medication questions or concerns at this time.  Will close pharmacy episode.  Harlow Asa, PharmD, [place] Management [phone]

## 2018-10-15 ENCOUNTER — Inpatient Hospital Stay: Payer: PPO | Attending: Oncology

## 2018-10-15 ENCOUNTER — Other Ambulatory Visit: Payer: Self-pay

## 2018-10-15 ENCOUNTER — Encounter: Payer: Self-pay | Admitting: Oncology

## 2018-10-15 ENCOUNTER — Inpatient Hospital Stay (HOSPITAL_BASED_OUTPATIENT_CLINIC_OR_DEPARTMENT_OTHER): Payer: PPO | Admitting: Oncology

## 2018-10-15 VITALS — BP 175/84 | HR 90 | Temp 98.6°F | Resp 16 | Ht 69.0 in | Wt 158.4 lb

## 2018-10-15 DIAGNOSIS — Z170 Estrogen receptor positive status [ER+]: Secondary | ICD-10-CM

## 2018-10-15 DIAGNOSIS — C50811 Malignant neoplasm of overlapping sites of right female breast: Secondary | ICD-10-CM | POA: Insufficient documentation

## 2018-10-15 DIAGNOSIS — D0512 Intraductal carcinoma in situ of left breast: Secondary | ICD-10-CM

## 2018-10-15 DIAGNOSIS — Z79811 Long term (current) use of aromatase inhibitors: Secondary | ICD-10-CM | POA: Insufficient documentation

## 2018-10-15 DIAGNOSIS — C50919 Malignant neoplasm of unspecified site of unspecified female breast: Secondary | ICD-10-CM

## 2018-10-15 LAB — COMPREHENSIVE METABOLIC PANEL
ALT: 22 U/L (ref 0–44)
AST: 21 U/L (ref 15–41)
Albumin: 4.2 g/dL (ref 3.5–5.0)
Alkaline Phosphatase: 54 U/L (ref 38–126)
Anion gap: 5 (ref 5–15)
BUN: 12 mg/dL (ref 8–23)
CO2: 36 mmol/L — ABNORMAL HIGH (ref 22–32)
Calcium: 9.3 mg/dL (ref 8.9–10.3)
Chloride: 99 mmol/L (ref 98–111)
Creatinine, Ser: 0.56 mg/dL (ref 0.44–1.00)
GFR calc non Af Amer: >60 mL/min (ref >60)
Glucose, Bld: 134 mg/dL — ABNORMAL HIGH (ref 70–99)
Potassium: 3.9 mmol/L (ref 3.5–5.1)
Sodium: 140 mmol/L (ref 135–145)
Total Bilirubin: 0.4 mg/dL (ref 0.3–1.2)
Total Protein: 7.4 g/dL (ref 6.5–8.1)

## 2018-10-15 LAB — CBC WITH DIFFERENTIAL/PLATELET
Abs Immature Granulocytes: 0.01 10*3/uL (ref 0.00–0.07)
BASOS ABS: 0.1 10*3/uL (ref 0.0–0.1)
Basophils Relative: 1 %
Eosinophils Absolute: 0.4 10*3/uL (ref 0.0–0.5)
Eosinophils Relative: 5 %
HCT: 43.3 % (ref 36.0–46.0)
Hemoglobin: 13.2 g/dL (ref 12.0–15.0)
Immature Granulocytes: 0 %
Lymphocytes Relative: 31 %
Lymphs Abs: 2.3 10*3/uL (ref 0.7–4.0)
MCH: 26.3 pg (ref 26.0–34.0)
MCHC: 30.5 g/dL (ref 30.0–36.0)
MCV: 86.3 fL (ref 80.0–100.0)
Monocytes Absolute: 0.7 10*3/uL (ref 0.1–1.0)
Monocytes Relative: 9 %
Neutro Abs: 4 10*3/uL (ref 1.7–7.7)
Neutrophils Relative %: 54 %
Platelets: 255 10*3/uL (ref 150–400)
RBC: 5.02 MIL/uL (ref 3.87–5.11)
RDW: 12.9 % (ref 11.5–15.5)
WBC: 7.5 10*3/uL (ref 4.0–10.5)
nRBC: 0 % (ref 0.0–0.2)

## 2018-10-15 NOTE — Progress Notes (Signed)
Patient here for follow  Up no changes since her last appointment. BP elevated today patient states she was started on Losartan two weeks ago.

## 2018-10-16 NOTE — Progress Notes (Signed)
Hematology/Oncology follow up  note [facility] [phone] Fax:([phone]   Patient Care Team: Baxter Hire, MD as PCP - General (Internal Medicine) Breast Surgeon Dr.Sakai.   CHIEF COMPLAINTS/REASON FOR VISIT:  Evaluation of breast cancer  HISTORY OF PRESENTING ILLNESS:  Amy Woodard is a  78 y.o.  female with PMH listed below who was referred to me for evaluation of newly diagnosed breast cancer. Patient had mammogram and ultrasound on 03/19/2018 which showed right breast 12:00 1.6 x 1.3 x 1.5 cm mass, no radiographically enlarged adenopathy.  Patient also report feeling a mass for the past couple of months. Biopsy pathology showed: Invasive mammary carcinoma, no special type, grade 3, DCIS present, lymphovascular invasion not identified, ER PR 90% positive, HER-2 negative  Nipple discharge: Denies Family history: Mother had melanoma, passed away at age of 15, sister had cervical cancer.  Sister with colon cancer, maternal grandmother sister OCP use: denies.  Estrogen and progesterone therapy: denies History of radiation to chest: denies.  Previous breast surgery: Denies  She has a complicated course of rest cancer diagnosis and surgery. # 04/16/2018 status post right breast mass lumpectomy and sentinel lymph node biopsy.  Pathology showed invasive mammary carcinoma 1.7 cm, DCIS, high-grade with extensive involvement of lobules.  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  Sentinel lymph node biopsy negative. pT1c pN0. ER >90%, PR >90%, HER 2 negative.   05/14/2018 patient underwent MRI breast bilaterally to determined extent of positive deep margin/lobular type breast cancer. MRI breast showed an irregular 1.4 cm mass in the lower inner quadrant of the left breast. Abnormal non-mass enhancement in the upper outer right breast, axillary tail region could be secondary to postsurgical changes, malignancy  cannot be excluded. 06/05/2018 left breast mass biopsy showed invasive ductal carcinoma, grade 3, [facility] ER 100%, PR 50%, not enough tissue for HER 2 analysis.  07/03/2018 patient underwent right breast deep margin lumpectomy/reexcision: Pathology showed negative for residual malignancy. Left breast needle localized lumpectomy showed negative for residual malignancy.  Sentinel lymph node negative In addition the outer slides on this patient's left breast biopsy were requested and reviewed by Dr. RReuel Derby  Dr. RReuel Derbyfeels the patient's biopsy showed high-grade DCIS with focal areas suspicious for invasion.  Definitive invasive carcinoma is not identified.  DCIS is present in both outside blocks and slides from biopsy and measures 7 mm and 6 mm respectively.  pTis pN0 Patient case have been discussed on breast tumor board multiple times with reviewing of images and pathology findings. Left breast MRI did show a 1.4 cm irregularity, and pathology only 7 mm and a 6 mm DCIS were found at the initial left breast biopsy.  Final left breast lumpectomy did not contain clips which was placed with MRI guidance at biopsy.. Per my discussion with surgeon Dr.Sakai, even clips are found on repeat image, re-excision is not feasible and the next step will be mastectomy.   # 07/03/2018-07/06/2018 admitted for pain control, sentinel lymph node biopsy site hematoma,  #07/10/2018 - 07/14/2018 Patient admitted due to acute on chronic anemia due to hematoma.  She was admitted for evacuation of postoperative left axillary sentinel lymph node biopsy site hematoma.  Status post PRBC transfusion during her admission. # 08/14/2018-08/17/2018 readmitted due to breast cellulitis despite outpatient drainage and oral antibiotics.  # 08/24/2018- 08/28/2018 readmitted due to recurrent right breast abscess. She was  placed on IV antibiotics with Ceftriaxone via picc line.  #A  repeat US has been done on 09/07/2018 and it shows near resolution of the fluid. # #Finished IV Ceftriaxone on 09/11/2018.   Patient has a history of chronic respiratory failure/ COPD, on home oxygen.  #Previously decided to omit chemotherapy due to severe delay secondary to complicated post surgery course, slow recovery from surgery, borderline performance status, comorbidities including chronic respiratory failure. #Started on ArimideX1/05/2019  INTERVAL HISTORY Amy Woodard is a 78 y.o. female who has above history reviewed by me today presents for follow up visit for management of breast cancer. #Reports feeling better since last visit.  Feels finally fully recovered to her baseline. Started Arimidex, reports tolerating well. Mild chronic joint achiness.  Manageable hot flash No new complaints today.  She has no complaints of her breast as well.  Wound has healed well.  No tenderness, swelling of breast.  Review of Systems  Constitutional: Negative for chills, fever, malaise/fatigue and weight loss.  HENT: Negative for nosebleeds and sore throat.   Eyes: Negative for double vision, photophobia and redness.  Respiratory: Positive for shortness of breath. Negative for cough and wheezing.   Cardiovascular: Negative for chest pain, palpitations, orthopnea and leg swelling.  Gastrointestinal: Negative for abdominal pain, blood in stool, nausea and vomiting.  Genitourinary: Negative for dysuria.  Musculoskeletal: Negative for myalgias.       Left axillary soreness  Skin: Negative for itching and rash.  Neurological: Negative for dizziness, tingling and tremors.  Endo/Heme/Allergies: Negative for environmental allergies. Does not bruise/bleed easily.  Psychiatric/Behavioral: Negative for depression and hallucinations.    MEDICAL HISTORY:  Past Medical History:  Diagnosis Date  . Allergy   . Arthritis    rheumatoid arthritis  . Breast cancer ([place]) 05/2018   Right breast found 1st,  then left breast with ductal ca  . Cellulitis of breast 05/2018   right breast cellulitis after first biopsy,  antibiotics prescribed  . COPD (chronic obstructive pulmonary disease) ([place])   . Diabetes mellitus without complication ([place])   . Dyspnea   . GERD (gastroesophageal reflux disease)    occasionally  . History of kidney stones    has one but not a problem  . Hypothyroidism   . Oxygen dependent    2liter nasal prong continuously    SURGICAL HISTORY: Past Surgical History:  Procedure Laterality Date  . APPENDECTOMY    . BACK SURGERY     lumbar. herniated disc . no metal  . BREAST BIOPSY Right 03/19/2018   INVASIVE MAMMARY CARCINOMA, NO SPECIAL TYPE  . BREAST BIOPSY Left 06/01/2018   MRI bx, grade III invasive ductal carcinoma  . BREAST BIOPSY Right 06/01/2018   MRI bx of enhancing mass, path showed FOREIGN BODY GRANULOMATOUS RESPONSE   . 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 BREAST LUMPECTOMY Right 07/03/2018   Procedure: RE-EXCISION OF RIGHT BREAST TISSUE MASS;  Surgeon: Benjamine Sprague, DO;  Location: [facility] ORS;  Service: General;  Laterality: Right;  . IRRIGATION AND DEBRIDEMENT HEMATOMA Left 07/10/2018   Procedure: IRRIGATION AND DEBRIDEMENT HEMATOMA LEFT AXILLARY;  Surgeon: Benjamine Sprague, DO;  Location: [facility] ORS;  Service: General;  Laterality: Left;  . PARTIAL MASTECTOMY WITH NEEDLE LOCALIZATION AND AXILLARY SENTINEL LYMPH NODE BX Right 04/16/2018   Procedure: PARTIAL MASTECTOMY WITH NEEDLE LOCALIZATION AND AXILLARY SENTINEL LYMPH NODE BX;  Surgeon: Benjamine Sprague, DO;  Location: [facility] ORS;  Service: General;  Laterality: Right;  . PARTIAL  MASTECTOMY WITH NEEDLE LOCALIZATION AND AXILLARY SENTINEL LYMPH NODE BX Left 07/03/2018   Procedure: PARTIAL MASTECTOMY WITH NEEDLE LOCALIZATION AND SENTINEL LYMPH NODE BX;  Surgeon: Benjamine Sprague, DO;  Location: [facility] ORS;  Service: General;  Laterality: Left;    SOCIAL HISTORY: Social History   Socioeconomic  History  . Marital status: Married    Spouse name: richard..disabled  . Number of children: Not on file  . Years of education: Not on file  . Highest education level: Not on file  Occupational History  . 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 Financial resource strain: Not hard at all  . Food insecurity:    Worry: Never true    Inability: Never true  . Transportation needs:    Medical: No    Non-medical: No  Tobacco Use  . Smoking status: Former Smoker    Types: Cigarettes    Last attempt to quit: 03/30/1993    Years since quitting: 25.5  . Smokeless tobacco: Never Used  Substance and Sexual Activity  . Alcohol use: No  . Drug use: Never  . Sexual activity: Not Currently  Lifestyle  . Physical activity:    Days per week: 7 days    Minutes per session: 30 min  . Stress: Not at all  Relationships  . Social connections:    Talks on phone: More than three times a week    Gets together: More than three times a week    Attends religious service: 1 to 4 times per year    Active member of club or organization: No    Attends meetings of clubs or organizations: Never    Relationship status: Married  . Intimate partner violence:    Fear of current or ex partner: No    Emotionally abused: No    Physically abused: No    Forced sexual activity: No  Other Topics Concern  . Not on file  Social History Narrative  . Not on file    FAMILY HISTORY: Family History  Problem Relation Age of Onset  . Clotting disorder Mother   . Melanoma Mother        deceased 52  . Lung cancer Sister        deceased 65s; smoker  . Alcohol abuse Father        deceased 61  . Cervical cancer Other        neice    ALLERGIES:  is allergic to sulfa antibiotics.  MEDICATIONS:  Current Outpatient Medications  Medication Sig Dispense Refill  . anastrozole (ARIMIDEX) 1 MG tablet Take 1 tablet (1 mg total) by mouth daily. 30 tablet 3  . azelastine (ASTELIN) 0.1 %  nasal spray Place 1 spray into both nostrils 2 (two) times daily.     . budesonide-formoterol (SYMBICORT) 160-4.5 MCG/ACT inhaler Inhale 2 puffs into the lungs 2 (two) times daily.     . calcium-vitamin D (OSCAL WITH D) 250-125 MG-UNIT tablet Take 1 tablet by mouth daily.    . folic acid (FOLVITE) 1 MG tablet Take 1 mg by mouth daily.    [name] glipiZIDE (GLUCOTROL XL) 5 MG 24 hr tablet Take 1 tablet by mouth daily.    . hydroxychloroquine (PLAQUENIL) 200 MG tablet Take 400 mg by mouth daily.     [name] levothyroxine (SYNTHROID, LEVOTHROID) 50 MCG tablet Take 50 mcg by mouth daily before breakfast.    . omeprazole (PRILOSEC) 20 MG capsule Take 20 mg  by mouth daily as needed.     [name] losartan (COZAAR) 25 MG tablet Take 1 tablet by mouth daily.     No current facility-administered medications for this visit.      PHYSICAL EXAMINATION: ECOG PERFORMANCE STATUS: 1 - Symptomatic but completely ambulatory Vitals:   10/15/18 0940  BP: (!) 175/84  Pulse: 90  Resp: 16  Temp: 98.6 F (37 C)  SpO2: 96%   Filed Weights   10/15/18 0940  Weight: 158 lb 6.4 oz (71.8 kg)    Physical Exam Constitutional:      General: She is not in acute distress.    Comments: On home oxygen  HENT:     Head: Normocephalic and atraumatic.  Eyes:     General: No scleral icterus.    Conjunctiva/sclera: Conjunctivae normal.     Pupils: Pupils are equal, round, and reactive to light.  Neck:     Musculoskeletal: Normal range of motion and neck supple.  Cardiovascular:     Rate and Rhythm: Normal rate and regular rhythm.     Heart sounds: Normal heart sounds.  Pulmonary:     Effort: Pulmonary effort is normal. No respiratory distress.     Breath sounds: No wheezing.  Abdominal:     General: Bowel sounds are normal. There is no distension.     Palpations: Abdomen is soft. There is no mass.     Tenderness: There is no abdominal tenderness.  Musculoskeletal: Normal range of motion.        General: No deformity.    Lymphadenopathy:     Cervical: No cervical adenopathy.  Skin:    General: Skin is warm and dry.     Findings: No erythema or rash.  Neurological:     Mental Status: She is alert and oriented to person, place, and time.     Cranial Nerves: No cranial nerve deficit.     Coordination: Coordination normal.  Psychiatric:        Behavior: Behavior normal.        Thought Content: Thought content normal.   .     LABORATORY DATA:  I have reviewed the data as listed Lab Results  Component Value Date   WBC 7.5 10/15/2018   HGB 13.2 10/15/2018   HCT 43.3 10/15/2018   MCV 86.3 10/15/2018   PLT 255 10/15/2018   Recent Labs    03/30/18 1551  08/27/18 0553 09/17/18 1032 10/15/18 0929  NA 142   < > 139 141 140  K 4.2   < > 4.1 4.0 3.9  CL 99   < > 101 98 99  CO2 37*   < > 34* 35* 36*  GLUCOSE 100*   < > 115* 121* 134*  BUN 15   < > _0 CREATININE 0.56   < > 0.55 0.73 0.56  CALCIUM 9.6   < > 8.8* 9.4 9.3  GFRNONAA >60   < > >60 >60 >60  GFRAA >60   < > >60 >60 >60  PROT 7.8  --   --  7.2 7.4  ALBUMIN 4.5  --   --  4.2 4.2  AST 21  --   --  19 21  ALT 19  --   --  17 22  ALKPHOS 51  --   --  50 54  BILITOT 0.4  --   --  0.4 0.4   < > = values in this interval not displayed.   Iron/TIBC/Ferritin/ %Sat  No results found for: IRON, TIBC, FERRITIN, IRONPCTSAT      ASSESSMENT & PLAN:  1. Invasive carcinoma of breast ([place])   2. Aromatase inhibitor use   3. Estrogen receptor positive status (ER+)   4. Ductal carcinoma in situ (DCIS) of left breast    #Right breast pT1c pN0, reexcision did not reveal any residual disease.. Oncotype DX showed recurrence rate of 31, patient has 15% benefit from adjuvant chemotherapy.  Decision was made not to proceed with adjuvant chemotherapy due to significant delays secondary to surgical complication, slow recovery from surgery, borderline performance status and comorbidities.  Patient declined radiation due to severe lung condition and is  afraid of lung toxicities.. Left breast high-grade DCIS She has been tolerating antiestrogen treatment with Arimidex very well.  Manageable side effects. We will also need to repeat a diagnostic left mammogram as left breast biopsy clip was not found in the specimen. Continue Arimidex, tolerates well.   Bone density has been scheduled to be done next week. Advised patient to take calcium 1000 mg and vitamin D 800 units. Discussed about obtaining dental clearance for bisphosphonate treatments.  # Anemia, due to hematoma, completely recovered.  We spent sufficient time to discuss many aspect of care, questions were answered to patient's satisfaction.  Orders Placed This Encounter  Procedures  . CBC with Differential/Platelet    Standing Status:   Future    Standing Expiration Date:   10/15/2019  . Comprehensive metabolic panel    Standing Status:   Future    Standing Expiration Date:   10/15/2019    All questions were answered. The patient knows to call the clinic with any problems questions or concerns.  Return of visit: 3 months.  Total face to face encounter time for this patient visit was 25 min. >50% of the time was  spent in counseling and coordination of care.     Earlie Server, MD, PhD Hematology Oncology [facility] Pager- [phone] 10/16/2018

## 2018-10-19 ENCOUNTER — Other Ambulatory Visit: Payer: Self-pay

## 2018-10-19 DIAGNOSIS — C50919 Malignant neoplasm of unspecified site of unspecified female breast: Secondary | ICD-10-CM

## 2018-10-23 DIAGNOSIS — J449 Chronic obstructive pulmonary disease, unspecified: Secondary | ICD-10-CM | POA: Diagnosis not present

## 2018-10-27 ENCOUNTER — Ambulatory Visit
Admission: RE | Admit: 2018-10-27 | Discharge: 2018-10-27 | Disposition: A | Discharge status: Home / Self Care | Payer: PPO | Source: Ambulatory Visit | Attending: Oncology | Admitting: Oncology

## 2018-10-27 DIAGNOSIS — Z853 Personal history of malignant neoplasm of breast: Secondary | ICD-10-CM | POA: Diagnosis not present

## 2018-10-27 DIAGNOSIS — M8588 Other specified disorders of bone density and structure, other site: Secondary | ICD-10-CM | POA: Insufficient documentation

## 2018-10-27 DIAGNOSIS — M85852 Other specified disorders of bone density and structure, left thigh: Secondary | ICD-10-CM | POA: Diagnosis not present

## 2018-10-27 DIAGNOSIS — Z79811 Long term (current) use of aromatase inhibitors: Secondary | ICD-10-CM | POA: Diagnosis not present

## 2018-10-27 DIAGNOSIS — C50919 Malignant neoplasm of unspecified site of unspecified female breast: Secondary | ICD-10-CM

## 2018-10-28 ENCOUNTER — Other Ambulatory Visit: Payer: PPO

## 2018-11-16 ENCOUNTER — Telehealth: Payer: Self-pay | Admitting: Oncology

## 2018-11-16 NOTE — Telephone Encounter (Signed)
Left mammogram results was discussed with patient.  Clip was seen in left breast. I called the patient and update her the mammogram results.  Per my previous discussion with Dr. Lysle Pearl, the only surgical option at this point is left mastectomy.  Discussed with patient that there is chance of remaining cancer in her left breast in the area where the clip was placed. Patient is not interested in additional breast surgeries.  Continue antiestrogen treatments and surveillance mammogram.

## 2018-11-18 DIAGNOSIS — Z9981 Dependence on supplemental oxygen: Secondary | ICD-10-CM | POA: Diagnosis not present

## 2018-11-18 DIAGNOSIS — R0602 Shortness of breath: Secondary | ICD-10-CM | POA: Diagnosis not present

## 2018-11-18 DIAGNOSIS — J449 Chronic obstructive pulmonary disease, unspecified: Secondary | ICD-10-CM | POA: Diagnosis not present

## 2018-11-25 ENCOUNTER — Encounter: Payer: Self-pay | Admitting: *Deleted

## 2018-12-04 ENCOUNTER — Encounter: Payer: Self-pay | Admitting: *Deleted

## 2018-12-04 ENCOUNTER — Other Ambulatory Visit: Payer: Self-pay | Admitting: *Deleted

## 2018-12-04 NOTE — Patient Outreach (Signed)
HTA outreach to discuss advanced directives. Mrs. Harm informs me that she is doing well now, recovered from her breast ca surgeries and is stable. She shares that she does have COPD and is on oxygen continuously. She sees her pulmonologist regularly at the [facility], Dr. Raul Del. She has a rescue inhaler and control inhaler (albuterol and symbicort).  She admits she does not have any advanced directives. We discussed what these documents are and their benefits.  She does say that she and her husband have discussed these decisions previously. I emphasized how important it is to complete the documents, get them notarized, and copies given to their MDs, and family members and to discuss their wishes with their family. She would like to have 2 sets, one for herself and one for her husband.  Gave her information about [facility] Care Management which she is familiar with having had discussions with our pharmacy team in the past.  Emphasized she can contact me with any questions in the future.  Sending a successful outreach letter and Advanced Directive documents.  Eulah Pont. Amy Neither, MSN, [facility] Gerontological Nurse Practitioner [facility] Care Management [phone]

## 2018-12-16 DIAGNOSIS — E119 Type 2 diabetes mellitus without complications: Secondary | ICD-10-CM | POA: Diagnosis not present

## 2018-12-21 ENCOUNTER — Other Ambulatory Visit: Payer: Self-pay

## 2018-12-22 ENCOUNTER — Other Ambulatory Visit: Payer: Self-pay | Admitting: Oncology

## 2018-12-22 ENCOUNTER — Inpatient Hospital Stay: Payer: PPO | Admitting: Oncology

## 2018-12-22 ENCOUNTER — Other Ambulatory Visit: Payer: Self-pay

## 2018-12-22 ENCOUNTER — Inpatient Hospital Stay: Payer: PPO | Attending: Oncology

## 2018-12-22 DIAGNOSIS — Z8049 Family history of malignant neoplasm of other genital organs: Secondary | ICD-10-CM | POA: Diagnosis not present

## 2018-12-22 DIAGNOSIS — D0512 Intraductal carcinoma in situ of left breast: Secondary | ICD-10-CM | POA: Diagnosis not present

## 2018-12-22 DIAGNOSIS — Z79811 Long term (current) use of aromatase inhibitors: Secondary | ICD-10-CM | POA: Diagnosis not present

## 2018-12-22 DIAGNOSIS — Z9981 Dependence on supplemental oxygen: Secondary | ICD-10-CM | POA: Diagnosis not present

## 2018-12-22 DIAGNOSIS — C7981 Secondary malignant neoplasm of breast: Secondary | ICD-10-CM | POA: Insufficient documentation

## 2018-12-22 DIAGNOSIS — E039 Hypothyroidism, unspecified: Secondary | ICD-10-CM | POA: Insufficient documentation

## 2018-12-22 DIAGNOSIS — Z801 Family history of malignant neoplasm of trachea, bronchus and lung: Secondary | ICD-10-CM | POA: Diagnosis not present

## 2018-12-22 DIAGNOSIS — M8580 Other specified disorders of bone density and structure, unspecified site: Secondary | ICD-10-CM | POA: Diagnosis not present

## 2018-12-22 DIAGNOSIS — Z882 Allergy status to sulfonamides status: Secondary | ICD-10-CM | POA: Insufficient documentation

## 2018-12-22 DIAGNOSIS — D649 Anemia, unspecified: Secondary | ICD-10-CM | POA: Insufficient documentation

## 2018-12-22 DIAGNOSIS — Z79899 Other long term (current) drug therapy: Secondary | ICD-10-CM | POA: Diagnosis not present

## 2018-12-22 DIAGNOSIS — J449 Chronic obstructive pulmonary disease, unspecified: Secondary | ICD-10-CM | POA: Insufficient documentation

## 2018-12-22 DIAGNOSIS — K219 Gastro-esophageal reflux disease without esophagitis: Secondary | ICD-10-CM | POA: Diagnosis not present

## 2018-12-22 DIAGNOSIS — Z7984 Long term (current) use of oral hypoglycemic drugs: Secondary | ICD-10-CM | POA: Insufficient documentation

## 2018-12-22 DIAGNOSIS — M069 Rheumatoid arthritis, unspecified: Secondary | ICD-10-CM | POA: Insufficient documentation

## 2018-12-22 DIAGNOSIS — Z87891 Personal history of nicotine dependence: Secondary | ICD-10-CM | POA: Diagnosis not present

## 2018-12-22 DIAGNOSIS — E119 Type 2 diabetes mellitus without complications: Secondary | ICD-10-CM | POA: Insufficient documentation

## 2018-12-22 LAB — CBC WITH DIFFERENTIAL/PLATELET
Abs Immature Granulocytes: 0.01 10*3/uL (ref 0.00–0.07)
Basophils Absolute: 0.1 10*3/uL (ref 0.0–0.1)
Basophils Relative: 1 %
Eosinophils Absolute: 0.5 10*3/uL (ref 0.0–0.5)
Eosinophils Relative: 6 %
HCT: 38.1 % (ref 36.0–46.0)
Hemoglobin: 11.9 g/dL — ABNORMAL LOW (ref 12.0–15.0)
Immature Granulocytes: 0 %
Lymphocytes Relative: 29 %
Lymphs Abs: 2.3 10*3/uL (ref 0.7–4.0)
MCH: 26.7 pg (ref 26.0–34.0)
MCHC: 31.2 g/dL (ref 30.0–36.0)
MCV: 85.6 fL (ref 80.0–100.0)
Monocytes Absolute: 0.7 10*3/uL (ref 0.1–1.0)
Monocytes Relative: 9 %
Neutro Abs: 4.3 10*3/uL (ref 1.7–7.7)
Neutrophils Relative %: 55 %
Platelets: 271 10*3/uL (ref 150–400)
RBC: 4.45 MIL/uL (ref 3.87–5.11)
RDW: 14.2 % (ref 11.5–15.5)
WBC: 7.9 10*3/uL (ref 4.0–10.5)
nRBC: 0 % (ref 0.0–0.2)

## 2018-12-22 LAB — COMPREHENSIVE METABOLIC PANEL
ALT: 17 U/L (ref 0–44)
AST: 18 U/L (ref 15–41)
Albumin: 4 g/dL (ref 3.5–5.0)
Alkaline Phosphatase: 48 U/L (ref 38–126)
Anion gap: 4 — ABNORMAL LOW (ref 5–15)
BUN: 14 mg/dL (ref 8–23)
CO2: 34 mmol/L — ABNORMAL HIGH (ref 22–32)
Calcium: 9.1 mg/dL (ref 8.9–10.3)
Chloride: 101 mmol/L (ref 98–111)
Creatinine, Ser: 0.65 mg/dL (ref 0.44–1.00)
GFR calc Af Amer: >60 mL/min (ref >60)
GFR calc non Af Amer: >60 mL/min (ref >60)
Glucose, Bld: 136 mg/dL — ABNORMAL HIGH (ref 70–99)
Potassium: 4.1 mmol/L (ref 3.5–5.1)
Sodium: 139 mmol/L (ref 135–145)
Total Bilirubin: 0.5 mg/dL (ref 0.3–1.2)
Total Protein: 7 g/dL (ref 6.5–8.1)

## 2018-12-23 ENCOUNTER — Encounter: Payer: Self-pay | Admitting: Oncology

## 2018-12-23 ENCOUNTER — Other Ambulatory Visit: Payer: Self-pay

## 2018-12-23 ENCOUNTER — Inpatient Hospital Stay (HOSPITAL_BASED_OUTPATIENT_CLINIC_OR_DEPARTMENT_OTHER): Payer: PPO | Admitting: Oncology

## 2018-12-23 DIAGNOSIS — Z79811 Long term (current) use of aromatase inhibitors: Secondary | ICD-10-CM

## 2018-12-23 DIAGNOSIS — J9611 Chronic respiratory failure with hypoxia: Secondary | ICD-10-CM | POA: Diagnosis not present

## 2018-12-23 DIAGNOSIS — E119 Type 2 diabetes mellitus without complications: Secondary | ICD-10-CM | POA: Diagnosis not present

## 2018-12-23 DIAGNOSIS — Z0000 Encounter for general adult medical examination without abnormal findings: Secondary | ICD-10-CM | POA: Diagnosis not present

## 2018-12-23 DIAGNOSIS — E039 Hypothyroidism, unspecified: Secondary | ICD-10-CM | POA: Diagnosis not present

## 2018-12-23 DIAGNOSIS — I10 Essential (primary) hypertension: Secondary | ICD-10-CM | POA: Diagnosis not present

## 2018-12-23 DIAGNOSIS — D0512 Intraductal carcinoma in situ of left breast: Secondary | ICD-10-CM | POA: Diagnosis not present

## 2018-12-23 DIAGNOSIS — C50919 Malignant neoplasm of unspecified site of unspecified female breast: Secondary | ICD-10-CM

## 2018-12-23 DIAGNOSIS — E034 Atrophy of thyroid (acquired): Secondary | ICD-10-CM | POA: Diagnosis not present

## 2018-12-23 DIAGNOSIS — M8580 Other specified disorders of bone density and structure, unspecified site: Secondary | ICD-10-CM | POA: Diagnosis not present

## 2018-12-23 DIAGNOSIS — E7800 Pure hypercholesterolemia, unspecified: Secondary | ICD-10-CM | POA: Diagnosis not present

## 2018-12-23 DIAGNOSIS — Z87891 Personal history of nicotine dependence: Secondary | ICD-10-CM

## 2018-12-23 DIAGNOSIS — D649 Anemia, unspecified: Secondary | ICD-10-CM | POA: Diagnosis not present

## 2018-12-23 DIAGNOSIS — Z79899 Other long term (current) drug therapy: Secondary | ICD-10-CM | POA: Diagnosis not present

## 2018-12-23 DIAGNOSIS — Z170 Estrogen receptor positive status [ER+]: Secondary | ICD-10-CM

## 2018-12-23 DIAGNOSIS — J449 Chronic obstructive pulmonary disease, unspecified: Secondary | ICD-10-CM | POA: Diagnosis not present

## 2018-12-23 MED ORDER — CALCIUM 1200 1200-1000 MG-UNIT PO CHEW: 1.0000 | CHEWABLE_TABLET | Freq: Every day | ORAL | 1 refills | Status: DC

## 2018-12-23 NOTE — Progress Notes (Signed)
HEMATOLOGY-ONCOLOGY TeleHEALTH VISIT PROGRESS NOTE  I connected with Amy Woodard on 12/23/18 at  9:30 AM EDT by telephone visit and verified that I am speaking with the correct person using two identifiers. I discussed the limitations, risks, security and privacy concerns of performing an evaluation and management service by telemedicine and the availability of in-person appointments. I also discussed with the patient that there may be a patient responsible charge related to this service. The patient expressed understanding and agreed to proceed.   Other persons participating in the visit and their role in the encounter:  Geraldine Solar, [place], check in patient     Patient's location: Home  Provider's location: Home  Chief Complaint:  Follow up for management of right breast cancer pT1c pN0, and left breast high grade DCIS  INTERVAL HISTORY Amy Woodard is a 78 y.o. female who has above history reviewed by me today presents for follow up visit for management of right breast cancer pT1c pN0, and left breast high grade DCIS Problems and complaints are listed below:  Reports feeling well. She takes Arimidex 1mg  daily. Reports tolerating well, denies significant side effects.  Denies body aches, hot flush, joint pain.  No new complaints today.  She denies any concerns about her breast.   Review of Systems  Constitutional: Negative for appetite change, chills, fatigue and fever.  HENT:   Negative for hearing loss and voice change.   Eyes: Negative for eye problems.  Respiratory: Negative for chest tightness and cough.   Cardiovascular: Negative for chest pain.  Gastrointestinal: Negative for abdominal distention, abdominal pain and blood in stool.  Endocrine: Negative for hot flashes.  Genitourinary: Negative for difficulty urinating and frequency.   Musculoskeletal: Negative for arthralgias.  Skin: Negative for itching and rash.  Neurological: Negative for extremity weakness.   Hematological: Negative for adenopathy.  Psychiatric/Behavioral: Negative for confusion.    Past Medical History:  Diagnosis Date  . Allergy   . Arthritis    rheumatoid arthritis  . Breast cancer ([place]) 05/2018   Right breast found 1st, then left breast with ductal ca  . Cellulitis of breast 05/2018   right breast cellulitis after first biopsy,  antibiotics prescribed  . COPD (chronic obstructive pulmonary disease) ([place])   . Diabetes mellitus without complication ([place])   . Dyspnea   . GERD (gastroesophageal reflux disease)    occasionally  . History of kidney stones    has one but not a problem  . Hypothyroidism   . Oxygen dependent    2liter nasal prong continuously   Past Surgical History:  Procedure Laterality Date  . APPENDECTOMY    . BACK SURGERY     lumbar. herniated disc . no metal  . BREAST BIOPSY Right 03/19/2018   INVASIVE MAMMARY CARCINOMA, NO SPECIAL TYPE  . BREAST BIOPSY Left 06/01/2018   MRI bx, grade III invasive ductal carcinoma  . BREAST BIOPSY Right 06/01/2018   MRI bx of enhancing mass, path showed FOREIGN BODY GRANULOMATOUS RESPONSE   . 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 BREAST LUMPECTOMY Right 07/03/2018   Procedure: RE-EXCISION OF RIGHT BREAST TISSUE MASS;  Surgeon: Benjamine Sprague, DO;  Location: [facility] ORS;  Service: General;  Laterality: Right;  . IRRIGATION AND DEBRIDEMENT HEMATOMA Left 07/10/2018   Procedure: IRRIGATION AND DEBRIDEMENT HEMATOMA LEFT AXILLARY;  Surgeon: Benjamine Sprague, DO;  Location: [facility] OR[facility];  Service: General;  Laterality: Left;  . PARTIAL MASTECTOMY WITH NEEDLE LOCALIZATION AND AXILLARY SENTINEL  LYMPH NODE BX Right 04/16/2018   Procedure: PARTIAL MASTECTOMY WITH NEEDLE LOCALIZATION AND AXILLARY SENTINEL LYMPH NODE BX;  Surgeon: Benjamine Sprague, DO;  Location: [facility];  Service: General;  Laterality: Right;  . PARTIAL MASTECTOMY WITH NEEDLE LOCALIZATION AND AXILLARY SENTINEL LYMPH NODE BX Left 07/03/2018    Procedure: PARTIAL MASTECTOMY WITH NEEDLE LOCALIZATION AND SENTINEL LYMPH NODE BX;  Surgeon: Benjamine Sprague, DO;  Location: [facility];  Service: General;  Laterality: Left;    Family History  Problem Relation Age of Onset  . Clotting disorder Mother   . Melanoma Mother        deceased 33  . Lung cancer Sister        deceased 62s; smoker  . Alcohol abuse Father        deceased 64  . Cervical cancer Other        neice    Social History   Socioeconomic History  . Marital status: Married    Spouse name: richard..disabled  . Number of children: Not on file  . Years of education: Not on file  . Highest education level: Not on file  Occupational History  . 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 Financial resource strain: Not hard at all  . Food insecurity:    Worry: Never true    Inability: Never true  . Transportation needs:    Medical: No    Non-medical: No  Tobacco Use  . Smoking status: Former Smoker    Types: Cigarettes    Last attempt to quit: 03/30/1993    Years since quitting: 25.7  . Smokeless tobacco: Never Used  Substance and Sexual Activity  . Alcohol use: No  . Drug use: Never  . Sexual activity: Not Currently  Lifestyle  . Physical activity:    Days per week: 7 days    Minutes per session: 30 min  . Stress: Not at all  Relationships  . Social connections:    Talks on phone: More than three times a week    Gets together: More than three times a week    Attends religious service: 1 to 4 times per year    Active member of club or organization: No    Attends meetings of clubs or organizations: Never    Relationship status: Married  . Intimate partner violence:    Fear of current or ex partner: No    Emotionally abused: No    Physically abused: No    Forced sexual activity: No  Other Topics Concern  . Not on file  Social History Narrative  . Not on file    Current Outpatient Medications on File Prior to Visit   Medication Sig Dispense Refill  . anastrozole (ARIMIDEX) 1 MG tablet TAKE 1 TABLET BY MOUTH EVERY DAY 90 tablet 1  . azelastine (ASTELIN) 0.1 % nasal spray Place 1 spray into both nostrils 2 (two) times daily.     . folic acid (FOLVITE) 1 MG tablet Take 1 mg by mouth daily.    [name] glipiZIDE (GLUCOTROL XL) 5 MG 24 hr tablet Take 1 tablet by mouth daily.    . hydroxychloroquine (PLAQUENIL) 200 MG tablet Take 400 mg by mouth daily.     [name] levothyroxine (SYNTHROID, LEVOTHROID) 50 MCG tablet Take 50 mcg by mouth daily before breakfast.    . losartan (COZAAR) 25 MG tablet Take 1 tablet by mouth daily.    [name] omeprazole (PRILOSEC) 20 MG capsule  Take 20 mg by mouth daily as needed.     . budesonide-formoterol (SYMBICORT) 160-4.5 MCG/ACT inhaler Inhale 2 puffs into the lungs 2 (two) times daily.      No current facility-administered medications on file prior to visit.     Allergies  Allergen Reactions  . Sulfa Antibiotics Itching       Observations/Objective: Today's Vitals   12/23/18 0938  PainSc: 0-No pain   There is no height or weight on file to calculate BMI.  Pain level 0  CBC    Component Value Date/Time   WBC 7.9 12/22/2018 1006   RBC 4.45 12/22/2018 1006   HGB 11.9 (L) 12/22/2018 1006   HGB 12.8 09/14/2014 0412   HCT 38.1 12/22/2018 1006   HCT 39.6 09/14/2014 0412   PLT 271 12/22/2018 1006   PLT 227 09/14/2014 0412   MCV 85.6 12/22/2018 1006   MCV 95 09/14/2014 0412   MCH 26.7 12/22/2018 1006   MCHC 31.2 12/22/2018 1006   RDW 14.2 12/22/2018 1006   RDW 15.9 (H) 09/14/2014 0412   LYMPHSABS 2.3 12/22/2018 1006   LYMPHSABS 0.9 (L) 09/13/2014 1910   MONOABS 0.7 12/22/2018 1006   MONOABS 2.0 (H) 09/13/2014 1910   EOSABS 0.5 12/22/2018 1006   EOSABS 0.0 09/13/2014 1910   BASOSABS 0.1 12/22/2018 1006   BASOSABS 0.0 09/13/2014 1910    CMP     Component Value Date/Time   NA 139 12/22/2018 1006   NA 142 09/14/2014 0412   K 4.1 12/22/2018 1006   K 4.2 09/14/2014 0412    CL 101 12/22/2018 1006   CL 102 09/14/2014 0412   CO2 34 (H) 12/22/2018 1006   CO2 34 (H) 09/14/2014 0412   GLUCOSE 136 (H) 12/22/2018 1006   GLUCOSE 118 (H) 09/14/2014 0412   BUN 14 12/22/2018 1006   BUN 8 09/14/2014 0412   CREATININE 0.65 12/22/2018 1006   CREATININE 0.67 09/14/2014 0412   CALCIUM 9.1 12/22/2018 1006   CALCIUM 8.3 (L) 09/14/2014 0412   PROT 7.0 12/22/2018 1006   ALBUMIN 4.0 12/22/2018 1006   AST 18 12/22/2018 1006   ALT 17 12/22/2018 1006   ALKPHOS 48 12/22/2018 1006   BILITOT 0.5 12/22/2018 1006   GFRNONAA >60 12/22/2018 1006   GFRNONAA >60 09/14/2014 0412   GFRAA >60 12/22/2018 1006   GFRAA >60 09/14/2014 0412     Assessment and Plan: 1. Aromatase inhibitor use   2. Invasive carcinoma of breast ([place])   3. Ductal carcinoma in situ (DCIS) of left breast   4. Anemia, unspecified type   5. Osteopenia, unspecified location     # Labs are reviewed and discussed with patient.  Anemia, chronic. Hemoglobin at her baseline between 11-12 Right Breast Cancer StageIA, left high grade DCIS.  Tolerating Arimidex well.  Continue Arimidex daily, plan 5-10 years.  Osteopenia,i reviewed her bone density results and explained to her about osteopenia.  I recommend her to increased the dose of calcium and vitamin D supplementation. Rx sent to pharmacy.  Recommend bisphosphonate treatment. Rationale and side effects were discussed.  Advise patient to obtain dental clearance.  Repeat Mammogram in 6 months- August 2020 Follow Up Instructions: 2 months.    I discussed the assessment and treatment plan with the patient. The patient was provided an opportunity to ask questions and all were answered. The patient agreed with the plan and demonstrated an understanding of the instructions.  The patient was advised to call back or seek an in-person  evaluation if the symptoms worsen or if the condition fails to improve as anticipated.   I provided 21 minutes of non face-to-face  telephone visit time during this encounter, and > 50% was spent counseling as documented under my assessment & plan.  Earlie Server, MD 12/23/2018 4:02 PM

## 2018-12-23 NOTE — Progress Notes (Signed)
Called patient for Televisit.  Patient states no new concerns today  

## 2019-01-21 DIAGNOSIS — J449 Chronic obstructive pulmonary disease, unspecified: Secondary | ICD-10-CM | POA: Diagnosis not present

## 2019-01-21 DIAGNOSIS — C50512 Malignant neoplasm of lower-outer quadrant of left female breast: Secondary | ICD-10-CM | POA: Diagnosis not present

## 2019-01-21 DIAGNOSIS — M79645 Pain in left finger(s): Secondary | ICD-10-CM | POA: Diagnosis not present

## 2019-01-21 DIAGNOSIS — M0579 Rheumatoid arthritis with rheumatoid factor of multiple sites without organ or systems involvement: Secondary | ICD-10-CM | POA: Diagnosis not present

## 2019-02-19 ENCOUNTER — Other Ambulatory Visit: Payer: Self-pay

## 2019-02-19 DIAGNOSIS — C50919 Malignant neoplasm of unspecified site of unspecified female breast: Secondary | ICD-10-CM

## 2019-02-21 DIAGNOSIS — J449 Chronic obstructive pulmonary disease, unspecified: Secondary | ICD-10-CM | POA: Diagnosis not present

## 2019-02-22 ENCOUNTER — Encounter: Payer: Self-pay | Admitting: Oncology

## 2019-02-22 ENCOUNTER — Inpatient Hospital Stay (HOSPITAL_BASED_OUTPATIENT_CLINIC_OR_DEPARTMENT_OTHER): Payer: PPO | Admitting: Oncology

## 2019-02-22 ENCOUNTER — Other Ambulatory Visit: Payer: Self-pay

## 2019-02-22 ENCOUNTER — Inpatient Hospital Stay: Payer: PPO | Attending: Oncology

## 2019-02-22 VITALS — BP 172/99 | HR 86 | Temp 97.8°F | Wt 162.4 lb

## 2019-02-22 DIAGNOSIS — C50919 Malignant neoplasm of unspecified site of unspecified female breast: Secondary | ICD-10-CM

## 2019-02-22 DIAGNOSIS — M8580 Other specified disorders of bone density and structure, unspecified site: Secondary | ICD-10-CM | POA: Diagnosis not present

## 2019-02-22 DIAGNOSIS — Z79811 Long term (current) use of aromatase inhibitors: Secondary | ICD-10-CM | POA: Insufficient documentation

## 2019-02-22 DIAGNOSIS — J449 Chronic obstructive pulmonary disease, unspecified: Secondary | ICD-10-CM | POA: Diagnosis not present

## 2019-02-22 DIAGNOSIS — C50811 Malignant neoplasm of overlapping sites of right female breast: Secondary | ICD-10-CM

## 2019-02-22 DIAGNOSIS — Z170 Estrogen receptor positive status [ER+]: Secondary | ICD-10-CM

## 2019-02-22 DIAGNOSIS — D0512 Intraductal carcinoma in situ of left breast: Secondary | ICD-10-CM

## 2019-02-22 LAB — CBC WITH DIFFERENTIAL/PLATELET
Abs Immature Granulocytes: 0.02 10*3/uL (ref 0.00–0.07)
Basophils Absolute: 0.1 10*3/uL (ref 0.0–0.1)
Basophils Relative: 1 %
Eosinophils Absolute: 0.5 10*3/uL (ref 0.0–0.5)
Eosinophils Relative: 5 %
HCT: 38.2 % (ref 36.0–46.0)
Hemoglobin: 11.9 g/dL — ABNORMAL LOW (ref 12.0–15.0)
Immature Granulocytes: 0 %
Lymphocytes Relative: 27 %
Lymphs Abs: 2.3 10*3/uL (ref 0.7–4.0)
MCH: 27.6 pg (ref 26.0–34.0)
MCHC: 31.2 g/dL (ref 30.0–36.0)
MCV: 88.6 fL (ref 80.0–100.0)
Monocytes Absolute: 0.7 10*3/uL (ref 0.1–1.0)
Monocytes Relative: 9 %
Neutro Abs: 4.9 10*3/uL (ref 1.7–7.7)
Neutrophils Relative %: 58 %
Platelets: 250 10*3/uL (ref 150–400)
RBC: 4.31 MIL/uL (ref 3.87–5.11)
RDW: 13.1 % (ref 11.5–15.5)
WBC: 8.4 10*3/uL (ref 4.0–10.5)
nRBC: 0 % (ref 0.0–0.2)

## 2019-02-22 LAB — COMPREHENSIVE METABOLIC PANEL
ALT: 15 U/L (ref 0–44)
AST: 17 U/L (ref 15–41)
Albumin: 4.2 g/dL (ref 3.5–5.0)
Alkaline Phosphatase: 47 U/L (ref 38–126)
Anion gap: 9 (ref 5–15)
BUN: 15 mg/dL (ref 8–23)
CO2: 32 mmol/L (ref 22–32)
Calcium: 9 mg/dL (ref 8.9–10.3)
Chloride: 99 mmol/L (ref 98–111)
Creatinine, Ser: 1.09 mg/dL — ABNORMAL HIGH (ref 0.44–1.00)
GFR calc Af Amer: 57 mL/min — ABNORMAL LOW (ref >60)
GFR calc non Af Amer: 49 mL/min — ABNORMAL LOW (ref >60)
Glucose, Bld: 127 mg/dL — ABNORMAL HIGH (ref 70–99)
Potassium: 4.2 mmol/L (ref 3.5–5.1)
Sodium: 140 mmol/L (ref 135–145)
Total Bilirubin: 0.4 mg/dL (ref 0.3–1.2)
Total Protein: 7 g/dL (ref 6.5–8.1)

## 2019-02-23 DIAGNOSIS — M8580 Other specified disorders of bone density and structure, unspecified site: Secondary | ICD-10-CM | POA: Insufficient documentation

## 2019-02-23 NOTE — Progress Notes (Signed)
Hematology/Oncology follow up  note [facility] Telephone:([phone]) [phone] Fax:(336) [phone]   Patient Care Team: Baxter Hire, MD as PCP - General (Internal Medicine) Breast Surgeon Dr.Sakai.   CHIEF COMPLAINTS/REASON FOR VISIT:  Evaluation of breast cancer  HISTORY OF PRESENTING ILLNESS:  Amy Woodard is a  78 y.o.  female with PMH listed below who was referred to me for evaluation of newly diagnosed breast cancer. Patient had mammogram and ultrasound on 03/19/2018 which showed right breast 12:00 1.6 x 1.3 x 1.5 cm mass, no radiographically enlarged adenopathy.  Patient also report feeling a mass for the past couple of months. Biopsy pathology showed: Invasive mammary carcinoma, no special type, grade 3, DCIS present, lymphovascular invasion not identified, ER PR 90% positive, HER-2 negative  Nipple discharge: Denies Family history: Mother had melanoma, passed away at age of 70, sister had cervical cancer.  Sister with colon cancer, maternal grandmother sister OCP use: denies.  Estrogen and progesterone therapy: denies History of radiation to chest: denies.  Previous breast surgery: Denies  She has a complicated course of rest cancer diagnosis and surgery. # 04/16/2018 status post right breast mass lumpectomy and sentinel lymph node biopsy.  Pathology showed invasive mammary carcinoma 1.7 cm, DCIS, high-grade with extensive involvement of lobules.  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  Sentinel lymph node biopsy negative. pT1c pN0. ER >90%, PR >90%, HER 2 negative.   05/14/2018 patient underwent MRI breast bilaterally to determined extent of positive deep margin/lobular type breast cancer. MRI breast showed an irregular 1.4 cm mass in the lower inner quadrant of the left breast. Abnormal non-mass enhancement in the upper outer right breast, axillary tail region could be secondary to postsurgical changes, malignancy  cannot be excluded. 06/05/2018 left breast mass biopsy showed invasive ductal carcinoma, grade 3, this [facility] ER 100%, PR 50%, not enough tissue for HER 2 analysis.  07/03/2018 patient underwent right breast deep margin lumpectomy/reexcision: Pathology showed negative for residual malignancy. Left breast needle localized lumpectomy showed negative for residual malignancy.  Sentinel lymph node negative In addition the outer slides on this patient's left breast biopsy were requested and reviewed by Dr. RReuel Derby  Dr. RReuel Derbyfeels the patient's biopsy showed high-grade DCIS with focal areas suspicious for invasion.  Definitive invasive carcinoma is not identified.  DCIS is present in both outside blocks and slides from biopsy and measures 7 mm and 6 mm respectively.  pTis pN0 Patient case have been discussed on breast tumor board multiple times with reviewing of images and pathology findings. Left breast MRI did show a 1.4 cm irregularity, and pathology only 7 mm and a 6 mm DCIS were found at the initial left breast biopsy.  Final left breast lumpectomy did not contain clips which was placed with MRI guidance at biopsy.. Per my discussion with surgeon Dr.Sakai, even clips are found on repeat image, re-excision is not feasible and the next step will be mastectomy.   # 07/03/2018-07/06/2018 admitted for pain control, sentinel lymph node biopsy site hematoma,  #07/10/2018 - 07/14/2018 Patient admitted due to acute on chronic anemia due to hematoma.  She was admitted for evacuation of postoperative left axillary sentinel lymph node biopsy site hematoma.  Status post PRBC transfusion during her admission. # 08/14/2018-08/17/2018 readmitted due to breast cellulitis despite outpatient drainage and oral antibiotics.  # 08/24/2018- 08/28/2018 readmitted due to recurrent right breast abscess. She was  placed on IV antibiotics with Ceftriaxone via picc line.  #A  repeat US has been done on 09/07/2018 and it shows near resolution of the fluid. # #Finished IV Ceftriaxone on 09/11/2018.   Patient has a history of chronic respiratory failure/ COPD, on home oxygen.  # Right breast pT1c pN0, reexcision did not reveal any residual disease.. Oncotype DX showed recurrence rate of 31, patient has 15% benefit from adjuvant chemotherapy.  Decision was made not to proceed with adjuvant chemotherapy due to significant delays secondary to surgical complication, slow recovery from surgery, borderline performance status and comorbidities.  Patient declined radiation due to severe lung condition and is afraid of lung toxicities.[name]  #Left breast high-grade DCIS She has been tolerating antiestrogen treatment with Arimidex very well.  Manageable side effects. left breast biopsy clip was not found in the specimen. Continue Arimidex, tolerates well.   #Previously decided to omit chemotherapy due to severe delay secondary to complicated post surgery course, slow recovery from surgery, borderline performance status, comorbidities including chronic respiratory failure. 10/27/2018 left unilateral diagnostic mammogram showed left breast lower inner quadrant still presents results was discussed with patient and patient's surgeon Dr. Lysle Pearl.  Dr. Lysle Pearl feels the only option will be mastectomy and the patient is not interested.   #Started on ArimideX1/05/2019  INTERVAL HISTORY [name] is a 78 y.o. female who has above history reviewed by me today presents for follow up visit for management of breast cancer. Patient reports feeling well after last visit. Breathing comfortably.  Takes Arimidex daily.  Tolerating well with mild chronic joint trouble hot flash. Reports mild left breast discomfort.   Review of Systems  Constitutional: Negative for chills, fever, malaise/fatigue and weight loss.  HENT: Negative for nosebleeds and sore throat.   Eyes: Negative for double vision,  photophobia and redness.  Respiratory: Positive for shortness of breath. Negative for cough and wheezing.   Cardiovascular: Negative for chest pain, palpitations, orthopnea and leg swelling.  Gastrointestinal: Negative for abdominal pain, blood in stool, nausea and vomiting.  Genitourinary: Negative for dysuria.  Musculoskeletal: Negative for myalgias.  Skin: Negative for itching and rash.  Neurological: Negative for dizziness, tingling and tremors.  Endo/Heme/Allergies: Negative for environmental allergies. Does not bruise/bleed easily.  Psychiatric/Behavioral: Negative for depression and hallucinations.    MEDICAL HISTORY:  Past Medical History:  Diagnosis Date  . Allergy   . Arthritis    rheumatoid arthritis  . Breast cancer ([place]) 05/2018   Right breast found 1st, then left breast with ductal ca  . Cellulitis of breast 05/2018   right breast cellulitis after first biopsy,  antibiotics prescribed  . COPD (chronic obstructive pulmonary disease) ([place])   . Diabetes mellitus without complication ([place])   . Dyspnea   . GERD (gastroesophageal reflux disease)    occasionally  . History of kidney stones    has one but not a problem  . Hypothyroidism   . Oxygen dependent    2liter nasal prong continuously    SURGICAL HISTORY: Past Surgical History:  Procedure Laterality Date  . APPENDECTOMY    . BACK SURGERY     lumbar. herniated disc . no metal  . BREAST BIOPSY Right 03/19/2018   INVASIVE MAMMARY CARCINOMA, NO SPECIAL TYPE  . BREAST BIOPSY Left 06/01/2018   MRI bx, grade III invasive ductal carcinoma  . BREAST BIOPSY Right 06/01/2018   MRI bx of enhancing mass, path showed FOREIGN BODY GRANULOMATOUS RESPONSE   . BREAST LUMPECTOMY Right 04/16/2018   [place],  DCIS, LN negative  . BREAST LUMPECTOMY Right 07/03/2018   Procedure: RE-EXCISION OF RIGHT BREAST TISSUE MASS;  Surgeon: Benjamine Sprague, DO;  Location: [facility] ORS;  Service: General;  Laterality: Right;  . IRRIGATION AND  DEBRIDEMENT HEMATOMA Left 07/10/2018   Procedure: IRRIGATION AND DEBRIDEMENT HEMATOMA LEFT AXILLARY;  Surgeon: Benjamine Sprague, DO;  Location: [facility] ORS;  Service: General;  Laterality: Left;  . PARTIAL MASTECTOMY WITH NEEDLE LOCALIZATION AND AXILLARY SENTINEL LYMPH NODE BX Right 04/16/2018   Procedure: PARTIAL MASTECTOMY WITH NEEDLE LOCALIZATION AND AXILLARY SENTINEL LYMPH NODE BX;  Surgeon: Benjamine Sprague, DO;  Location: [facility] ORS;  Service: General;  Laterality: Right;  . PARTIAL MASTECTOMY WITH NEEDLE LOCALIZATION AND AXILLARY SENTINEL LYMPH NODE BX Left 07/03/2018   Procedure: PARTIAL MASTECTOMY WITH NEEDLE LOCALIZATION AND SENTINEL LYMPH NODE BX;  Surgeon: Benjamine Sprague, DO;  Location: [facility] ORS;  Service: General;  Laterality: Left;    SOCIAL HISTORY: Social History   Socioeconomic History  . Marital status: Married    Spouse name: richard..disabled  . Number of children: Not on file  . Years of education: Not on file  . Highest education level: Not on file  Occupational History  . 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 Financial resource strain: Not hard at all  . Food insecurity    Worry: Never true    Inability: Never true  . Transportation needs    Medical: No    Non-medical: No  Tobacco Use  . Smoking status: Former Smoker    Types: Cigarettes    Quit date: 03/30/1993    Years since quitting: 25.9  . Smokeless tobacco: Never Used  Substance and Sexual Activity  . Alcohol use: No  . Drug use: Never  . Sexual activity: Not Currently  Lifestyle  . Physical activity    Days per week: 7 days    Minutes per session: 30 min  . Stress: Not at all  Relationships  . Social connections    Talks on phone: More than three times a week    Gets together: More than three times a week    Attends religious service: 1 to 4 times per year    Active member of club or organization: No    Attends meetings of clubs or organizations: Never    Relationship  status: Married  . Intimate partner violence    Fear of current or ex partner: No    Emotionally abused: No    Physically abused: No    Forced sexual activity: No  Other Topics Concern  . Not on file  Social History Narrative  . Not on file    FAMILY HISTORY: Family History  Problem Relation Age of Onset  . Clotting disorder Mother   . Melanoma Mother        deceased 64  . Lung cancer Sister        deceased 56s; smoker  . Alcohol abuse Father        deceased 58  . Cervical cancer Other        neice    ALLERGIES:  is allergic to sulfa antibiotics.  MEDICATIONS:  Current Outpatient Medications  Medication Sig Dispense Refill  . anastrozole (ARIMIDEX) 1 MG tablet TAKE 1 TABLET BY MOUTH EVERY DAY 90 tablet 1  . azelastine (ASTELIN) 0.1 % nasal spray Place 1 spray into both nostrils 2 (two) times daily.     . Calcium Carbonate-Vit D-Min (CALCIUM 1200) 1200-1000  MG-UNIT CHEW Chew 1 tablet by mouth daily. 90 each 1  . folic acid (FOLVITE) 1 MG tablet Take 1 mg by mouth daily.    [name] glipiZIDE (GLUCOTROL XL) 5 MG 24 hr tablet Take 1 tablet by mouth daily.    . hydroxychloroquine (PLAQUENIL) 200 MG tablet Take 400 mg by mouth daily.     . irbesartan (AVAPRO) 75 MG tablet Take 75 mg by mouth daily.    [name] levothyroxine (SYNTHROID, LEVOTHROID) 50 MCG tablet Take 50 mcg by mouth daily before breakfast.    . losartan (COZAAR) 25 MG tablet Take 1 tablet by mouth daily.    [name] omeprazole (PRILOSEC) 20 MG capsule Take 20 mg by mouth daily as needed.     . budesonide-formoterol (SYMBICORT) 160-4.5 MCG/ACT inhaler Inhale 2 puffs into the lungs 2 (two) times daily.      No current facility-administered medications for this visit.      PHYSICAL EXAMINATION: ECOG PERFORMANCE STATUS: 1 - Symptomatic but completely ambulatory Vitals:   02/22/19 1326  BP: (!) 172/99  Pulse: 86  Temp: 97.8 F (36.6 C)   Filed Weights   02/22/19 1326  Weight: 162 lb 6.4 oz (73.7 kg)    Physical Exam  Constitutional:      General: She is not in acute distress.    Comments: On home oxygen  HENT:     Head: Normocephalic and atraumatic.  Eyes:     General: No scleral icterus.    Conjunctiva/sclera: Conjunctivae normal.     Pupils: Pupils are equal, round, and reactive to light.  Neck:     Musculoskeletal: Normal range of motion and neck supple.  Cardiovascular:     Rate and Rhythm: Normal rate and regular rhythm.     Heart sounds: Normal heart sounds.  Pulmonary:     Effort: Pulmonary effort is normal. No respiratory distress.     Breath sounds: No wheezing.  Abdominal:     General: Bowel sounds are normal. There is no distension.     Palpations: Abdomen is soft. There is no mass.     Tenderness: There is no abdominal tenderness.  Musculoskeletal: Normal range of motion.        General: No deformity.  Lymphadenopathy:     Cervical: No cervical adenopathy.  Skin:    General: Skin is warm and dry.     Findings: No erythema or rash.  Neurological:     Mental Status: She is alert and oriented to person, place, and time.     Cranial Nerves: No cranial nerve deficit.     Coordination: Coordination normal.  Psychiatric:        Behavior: Behavior normal.        Thought Content: Thought content normal.   Breast exam was performed in seated and lying down position. Patient is status post bilateral lumpectomy, No palpable right breast mass or palpable axillary lymphadenopathy. Left breast tissue thickening around incision scar, no palpable left axillary     LABORATORY DATA:  I have reviewed the data as listed Lab Results  Component Value Date   WBC 8.4 02/22/2019   HGB 11.9 (L) 02/22/2019   HCT 38.2 02/22/2019   MCV 88.6 02/22/2019   PLT 250 02/22/2019   Recent Labs    10/15/18 0929 12/22/18 1006 02/22/19 1302  NA 140 139 140  K 3.9 4.1 4.2  CL 99 101 99  CO2 36* 34* 32  GLUCOSE 134* 136* 127*  BUN 12 14 15  CREATININE 0.56 0.65 1.09*  CALCIUM 9.3 9.1 9.0   GFRNONAA >60 >60 49*  GFRAA >60 >60 57*  PROT 7.4 7.0 7.0  ALBUMIN 4.2 4.0 4.2  AST 21 18 17   ALT 22 17 15   ALKPHOS 54 48 47  BILITOT 0.4 0.5 0.4   Iron/TIBC/Ferritin/ %Sat No results found for: IRON, TIBC, FERRITIN, IRONPCTSAT      ASSESSMENT & PLAN:  1. Invasive carcinoma of breast ([place])   2. Ductal carcinoma in situ (DCIS) of left breast   3. Osteopenia, unspecified location    #Right invasive carcinoma of breast and the left DCIS Patient asks questions about whether left breast DCIS was thoroughly taken out or not.   I had a lengthy discussion with patient again about the mammogram results on 10/27/2018.  Left breast lower inner quadrant clip still present.  We discussed possibility of residual DCIS/invasive breast cancer.  Per my discussion with Dr. Lysle Pearl previously, only surgical option will be mastectomy.  Patient is not interested.  She appreciated pronation. Obtain bilateral diagnostic mammogram in August 2020. Continue Arimidex.   Bone density has been scheduled to be done next week. Advised patient to take calcium 1000 mg and vitamin D 800 units. Discussed about obtaining dental clearance for bisphosphonate treatments.  # Anemia, due to hematoma, completely recovered.  We spent sufficient time to discuss many aspect of care, questions were answered to patient's satisfaction.  Orders Placed This Encounter  Procedures  . MM DIAG BREAST TOMO BILATERAL    Standing Status:   Future    Standing Expiration Date:   02/22/2020    Order Specific Question:   Reason for Exam (SYMPTOM  OR DIAGNOSIS REQUIRED)    Answer:   history of right breast cancer, left breast DCIS    Order Specific Question:   Preferred imaging location?    Answer:   [facility]  . US Breast Limited Uni Left Inc Axilla    Standing Status:   Future    Standing Expiration Date:   04/23/2020    Order Specific Question:   Reason for Exam (SYMPTOM  OR DIAGNOSIS REQUIRED)    Answer:   history of right  breast cancer, left breast DCIS    Order Specific Question:   Preferred imaging location?    Answer:   [facility]  . US Breast Limited Uni Right Inc Axilla    Standing Status:   Future    Standing Expiration Date:   04/23/2020    Order Specific Question:   Reason for Exam (SYMPTOM  OR DIAGNOSIS REQUIRED)    Answer:   history of right breast cancer, left breast DCIS    Order Specific Question:   Preferred imaging location?    Answer:   [facility]    All questions were answered. The patient knows to call the clinic with any problems questions or concerns.  Return of visit: 3 months.  We spent sufficient time to discuss many aspect of care, questions were answered to patient's satisfaction. Total face to face encounter time for this patient visit was 25 min. >50% of the time was  spent in counseling and coordination of care.     Earlie Server, MD, PhD 02/23/2019

## 2019-03-17 DIAGNOSIS — R0602 Shortness of breath: Secondary | ICD-10-CM | POA: Diagnosis not present

## 2019-03-17 DIAGNOSIS — J449 Chronic obstructive pulmonary disease, unspecified: Secondary | ICD-10-CM | POA: Diagnosis not present

## 2019-03-17 DIAGNOSIS — E034 Atrophy of thyroid (acquired): Secondary | ICD-10-CM | POA: Diagnosis not present

## 2019-03-17 DIAGNOSIS — E119 Type 2 diabetes mellitus without complications: Secondary | ICD-10-CM | POA: Diagnosis not present

## 2019-03-23 DIAGNOSIS — J449 Chronic obstructive pulmonary disease, unspecified: Secondary | ICD-10-CM | POA: Diagnosis not present

## 2019-03-24 DIAGNOSIS — J449 Chronic obstructive pulmonary disease, unspecified: Secondary | ICD-10-CM | POA: Diagnosis not present

## 2019-03-24 DIAGNOSIS — J9611 Chronic respiratory failure with hypoxia: Secondary | ICD-10-CM | POA: Diagnosis not present

## 2019-03-24 DIAGNOSIS — D0510 Intraductal carcinoma in situ of unspecified breast: Secondary | ICD-10-CM | POA: Diagnosis not present

## 2019-03-24 DIAGNOSIS — I10 Essential (primary) hypertension: Secondary | ICD-10-CM | POA: Diagnosis not present

## 2019-03-24 DIAGNOSIS — E034 Atrophy of thyroid (acquired): Secondary | ICD-10-CM | POA: Diagnosis not present

## 2019-03-24 DIAGNOSIS — E119 Type 2 diabetes mellitus without complications: Secondary | ICD-10-CM | POA: Diagnosis not present

## 2019-03-24 DIAGNOSIS — C50411 Malignant neoplasm of upper-outer quadrant of right female breast: Secondary | ICD-10-CM | POA: Diagnosis not present

## 2019-03-24 DIAGNOSIS — Z170 Estrogen receptor positive status [ER+]: Secondary | ICD-10-CM | POA: Diagnosis not present

## 2019-04-08 DIAGNOSIS — M05741 Rheumatoid arthritis with rheumatoid factor of right hand without organ or systems involvement: Secondary | ICD-10-CM | POA: Diagnosis not present

## 2019-04-08 DIAGNOSIS — Z79899 Other long term (current) drug therapy: Secondary | ICD-10-CM | POA: Diagnosis not present

## 2019-04-15 ENCOUNTER — Ambulatory Visit
Admission: RE | Admit: 2019-04-15 | Discharge: 2019-04-15 | Disposition: A | Discharge status: Home / Self Care | Payer: PPO | Source: Ambulatory Visit | Attending: Oncology | Admitting: Oncology

## 2019-04-15 DIAGNOSIS — D0512 Intraductal carcinoma in situ of left breast: Secondary | ICD-10-CM

## 2019-04-15 DIAGNOSIS — C50919 Malignant neoplasm of unspecified site of unspecified female breast: Secondary | ICD-10-CM

## 2019-04-15 DIAGNOSIS — R922 Inconclusive mammogram: Secondary | ICD-10-CM | POA: Diagnosis not present

## 2019-04-23 DIAGNOSIS — J449 Chronic obstructive pulmonary disease, unspecified: Secondary | ICD-10-CM | POA: Diagnosis not present

## 2019-05-03 ENCOUNTER — Other Ambulatory Visit: Payer: Self-pay | Admitting: Oncology

## 2019-05-21 NOTE — Progress Notes (Deleted)
Called patient no answer, left message

## 2019-05-24 ENCOUNTER — Inpatient Hospital Stay: Payer: PPO | Admitting: Oncology

## 2019-05-24 ENCOUNTER — Inpatient Hospital Stay: Payer: PPO

## 2019-05-24 DIAGNOSIS — J449 Chronic obstructive pulmonary disease, unspecified: Secondary | ICD-10-CM | POA: Diagnosis not present

## 2019-06-07 ENCOUNTER — Other Ambulatory Visit: Payer: Self-pay

## 2019-06-07 NOTE — Progress Notes (Signed)
Patient has an area of concern on her left shoulder .

## 2019-06-08 ENCOUNTER — Inpatient Hospital Stay: Payer: PPO

## 2019-06-08 ENCOUNTER — Inpatient Hospital Stay: Payer: PPO | Attending: Oncology | Admitting: Oncology

## 2019-06-08 ENCOUNTER — Encounter: Payer: Self-pay | Admitting: Oncology

## 2019-06-08 ENCOUNTER — Other Ambulatory Visit: Payer: Self-pay

## 2019-06-08 VITALS — BP 133/51 | HR 90 | Temp 99.2°F | Wt 161.6 lb

## 2019-06-08 DIAGNOSIS — R222 Localized swelling, mass and lump, trunk: Secondary | ICD-10-CM | POA: Insufficient documentation

## 2019-06-08 DIAGNOSIS — Z87891 Personal history of nicotine dependence: Secondary | ICD-10-CM | POA: Insufficient documentation

## 2019-06-08 DIAGNOSIS — R232 Flushing: Secondary | ICD-10-CM | POA: Diagnosis not present

## 2019-06-08 DIAGNOSIS — G8929 Other chronic pain: Secondary | ICD-10-CM | POA: Diagnosis not present

## 2019-06-08 DIAGNOSIS — E119 Type 2 diabetes mellitus without complications: Secondary | ICD-10-CM | POA: Insufficient documentation

## 2019-06-08 DIAGNOSIS — Z79899 Other long term (current) drug therapy: Secondary | ICD-10-CM | POA: Insufficient documentation

## 2019-06-08 DIAGNOSIS — C50919 Malignant neoplasm of unspecified site of unspecified female breast: Secondary | ICD-10-CM | POA: Diagnosis not present

## 2019-06-08 DIAGNOSIS — J9610 Chronic respiratory failure, unspecified whether with hypoxia or hypercapnia: Secondary | ICD-10-CM | POA: Insufficient documentation

## 2019-06-08 DIAGNOSIS — D0592 Unspecified type of carcinoma in situ of left breast: Secondary | ICD-10-CM | POA: Insufficient documentation

## 2019-06-08 DIAGNOSIS — D0512 Intraductal carcinoma in situ of left breast: Secondary | ICD-10-CM

## 2019-06-08 DIAGNOSIS — K219 Gastro-esophageal reflux disease without esophagitis: Secondary | ICD-10-CM | POA: Insufficient documentation

## 2019-06-08 DIAGNOSIS — E559 Vitamin D deficiency, unspecified: Secondary | ICD-10-CM | POA: Insufficient documentation

## 2019-06-08 DIAGNOSIS — Z9981 Dependence on supplemental oxygen: Secondary | ICD-10-CM | POA: Diagnosis not present

## 2019-06-08 DIAGNOSIS — M8580 Other specified disorders of bone density and structure, unspecified site: Secondary | ICD-10-CM | POA: Insufficient documentation

## 2019-06-08 DIAGNOSIS — Z79811 Long term (current) use of aromatase inhibitors: Secondary | ICD-10-CM | POA: Diagnosis not present

## 2019-06-08 DIAGNOSIS — N951 Menopausal and female climacteric states: Secondary | ICD-10-CM | POA: Diagnosis not present

## 2019-06-08 DIAGNOSIS — J449 Chronic obstructive pulmonary disease, unspecified: Secondary | ICD-10-CM | POA: Diagnosis not present

## 2019-06-08 DIAGNOSIS — M069 Rheumatoid arthritis, unspecified: Secondary | ICD-10-CM | POA: Diagnosis not present

## 2019-06-08 LAB — COMPREHENSIVE METABOLIC PANEL
ALT: 15 U/L (ref 0–44)
AST: 18 U/L (ref 15–41)
Albumin: 4.3 g/dL (ref 3.5–5.0)
Alkaline Phosphatase: 53 U/L (ref 38–126)
Anion gap: 8 (ref 5–15)
BUN: 15 mg/dL (ref 8–23)
CO2: 34 mmol/L — ABNORMAL HIGH (ref 22–32)
Calcium: 9.1 mg/dL (ref 8.9–10.3)
Chloride: 99 mmol/L (ref 98–111)
Creatinine, Ser: 0.91 mg/dL (ref 0.44–1.00)
GFR calc Af Amer: >60 mL/min (ref >60)
GFR calc non Af Amer: >60 mL/min (ref >60)
Glucose, Bld: 181 mg/dL — ABNORMAL HIGH (ref 70–99)
Potassium: 4.1 mmol/L (ref 3.5–5.1)
Sodium: 141 mmol/L (ref 135–145)
Total Bilirubin: 0.5 mg/dL (ref 0.3–1.2)
Total Protein: 7.4 g/dL (ref 6.5–8.1)

## 2019-06-08 LAB — CBC WITH DIFFERENTIAL/PLATELET
Abs Immature Granulocytes: 0.02 10*3/uL (ref 0.00–0.07)
Basophils Absolute: 0.1 10*3/uL (ref 0.0–0.1)
Basophils Relative: 1 %
Eosinophils Absolute: 0.4 10*3/uL (ref 0.0–0.5)
Eosinophils Relative: 5 %
HCT: 40 % (ref 36.0–46.0)
Hemoglobin: 12.3 g/dL (ref 12.0–15.0)
Immature Granulocytes: 0 %
Lymphocytes Relative: 24 %
Lymphs Abs: 1.8 10*3/uL (ref 0.7–4.0)
MCH: 27.1 pg (ref 26.0–34.0)
MCHC: 30.8 g/dL (ref 30.0–36.0)
MCV: 88.1 fL (ref 80.0–100.0)
Monocytes Absolute: 0.5 10*3/uL (ref 0.1–1.0)
Monocytes Relative: 7 %
Neutro Abs: 4.7 10*3/uL (ref 1.7–7.7)
Neutrophils Relative %: 63 %
Platelets: 273 10*3/uL (ref 150–400)
RBC: 4.54 MIL/uL (ref 3.87–5.11)
RDW: 12.9 % (ref 11.5–15.5)
WBC: 7.6 10*3/uL (ref 4.0–10.5)
nRBC: 0 % (ref 0.0–0.2)

## 2019-06-08 LAB — VITAMIN D 25 HYDROXY (VIT D DEFICIENCY, FRACTURES): Vit D, 25-Hydroxy: 16.51 ng/mL — ABNORMAL LOW (ref 30–100)

## 2019-06-08 MED ORDER — VITAMIN D 50 MCG (2000 UT) PO TABS: 2000.0000 [IU] | ORAL_TABLET | Freq: Every day | ORAL | 0 refills | Status: DC

## 2019-06-08 MED ORDER — ANASTROZOLE 1 MG PO TABS: 1.0000 mg | ORAL_TABLET | Freq: Every day | ORAL | 3 refills | Status: DC

## 2019-06-08 NOTE — Progress Notes (Signed)
Hematology/Oncology follow up  note [facility] [phone] [phone]   Patient Care Team: Baxter Hire, MD as PCP - General (Internal Medicine) Breast Surgeon Dr.Sakai.   CHIEF COMPLAINTS/REASON FOR VISIT:  Follow up  of breast cancer  HISTORY OF PRESENTING ILLNESS:  Amy Woodard is a  78 y.o.  female with PMH listed below who was referred to me for evaluation of newly diagnosed breast cancer. Patient had mammogram and ultrasound on 03/19/2018 which showed right breast 12:00 1.6 x 1.3 x 1.5 cm mass, no radiographically enlarged adenopathy.  Patient also report feeling a mass for the past couple of months. Biopsy pathology showed: Invasive mammary carcinoma, no special type, grade 3, DCIS present, lymphovascular invasion not identified, ER PR 90% positive, HER-2 negative  Nipple discharge: Denies Family history: Mother had melanoma, passed away at age of 89, sister had cervical cancer.  Sister with colon cancer, maternal grandmother sister OCP use: denies.  Estrogen and progesterone therapy: denies History of radiation to chest: denies.  Previous breast surgery: Denies  She has a complicated course of rest cancer diagnosis and surgery. # 04/16/2018 status post right breast mass lumpectomy and sentinel lymph node biopsy.  Pathology showed invasive mammary carcinoma 1.7 cm, DCIS, high-grade with extensive involvement of lobules.  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  Sentinel lymph node biopsy negative. pT1c pN0. ER >90%, PR >90%, HER 2 negative.   05/14/2018 patient underwent MRI breast bilaterally to determined extent of positive deep margin/lobular type breast cancer. MRI breast showed an irregular 1.4 cm mass in the lower inner quadrant of the left breast. Abnormal non-mass enhancement in the upper outer right breast, axillary tail region could be secondary to postsurgical changes, malignancy  cannot be excluded. 06/05/2018 left breast mass biopsy showed invasive ductal carcinoma, grade 3, [facility] NJaquita Folds ER 100%, PR 50%, not enough tissue for HER 2 analysis.  07/03/2018 patient underwent right breast deep margin lumpectomy/reexcision: Pathology showed negative for residual malignancy. Left breast needle localized lumpectomy showed negative for residual malignancy.  Sentinel lymph node negative In addition the outer slides on this patient's left breast biopsy were requested and reviewed by Dr. RReuel Derby  Dr. RReuel Derbyfeels the patient's biopsy showed high-grade DCIS with focal areas suspicious for invasion.  Definitive invasive carcinoma is not identified.  DCIS is present in both outside blocks and slides from biopsy and measures 7 mm and 6 mm respectively.  pTis pN0 Patient case have been discussed on breast tumor board multiple times with reviewing of images and pathology findings. Left breast MRI did show a 1.4 cm irregularity, and pathology only 7 mm and a 6 mm DCIS were found at the initial left breast biopsy.  Final left breast lumpectomy did not contain clips which was placed with MRI guidance at biopsy.. Per my discussion with surgeon Dr.Sakai, even clips are found on repeat image, re-excision is not feasible and the next step will be mastectomy.   # 07/03/2018-07/06/2018 admitted for pain control, sentinel lymph node biopsy site hematoma,  #07/10/2018 - 07/14/2018 Patient admitted due to acute on chronic anemia due to hematoma.  She was admitted for evacuation of postoperative left axillary sentinel lymph node biopsy site hematoma.  Status post PRBC transfusion during her admission. # 08/14/2018-08/17/2018 readmitted due to breast cellulitis despite outpatient drainage and oral antibiotics.  # 08/24/2018- 08/28/2018 readmitted due to recurrent right breast abscess.  She was placed on IV antibiotics with Ceftriaxone via picc line.  #A  repeat US has been done on 09/07/2018 and it shows near resolution of the fluid. # #Finished IV Ceftriaxone on 09/11/2018.   Patient has a history of chronic respiratory failure/ COPD, on home oxygen.  # Right breast pT1c pN0, reexcision did not reveal any residual disease.. Oncotype DX showed recurrence rate of 31, patient has 15% benefit from adjuvant chemotherapy.  Decision was made not to proceed with adjuvant chemotherapy due to significant delays secondary to surgical complication, slow recovery from surgery, borderline performance status and comorbidities.  Patient declined radiation due to severe lung condition and is afraid of lung toxicities.[name]  #Left breast high-grade DCIS She has been tolerating antiestrogen treatment with Arimidex very well.  Manageable side effects. left breast biopsy clip was not found in the specimen. Continue Arimidex, tolerates well.   #Previously decided to omit chemotherapy due to severe delay secondary to complicated post surgery course, slow recovery from surgery, borderline performance status, comorbidities including chronic respiratory failure. 10/27/2018 left unilateral diagnostic mammogram showed left breast lower inner quadrant still presents results was discussed with patient and patient's surgeon Dr. Lysle Pearl.  Dr. Lysle Pearl feels the only option will be mastectomy and the patient is not interested.   #Started on ArimideX1/05/2019  INTERVAL HISTORY Amy Woodard is a 78 y.o. female who has above history reviewed by me today presents for follow up visit for management of breast cancer. Patient reports feeling more shortness of breath since last visit. Patient has chronic respiratory failure on home oxygen. She tolerates daily Arimidex.  Mild chronic joint pain and manageable hot flashes She noticed an area on her back and want me to take a look at it. Noticed the lump a few weeks ago, lump is nontender. Denies any breast concerns today.   Patient had  bilateral diagnostic mammogram done    Review of Systems  Constitutional: Negative for chills, fever, malaise/fatigue and weight loss.  HENT: Negative for nosebleeds and sore throat.   Eyes: Negative for double vision, photophobia and redness.  Respiratory: Positive for shortness of breath. Negative for cough and wheezing.   Cardiovascular: Negative for chest pain, palpitations, orthopnea and leg swelling.  Gastrointestinal: Negative for abdominal pain, blood in stool, nausea and vomiting.  Genitourinary: Negative for dysuria.  Musculoskeletal: Negative for myalgias.       Upper back mass  Skin: Negative for itching and rash.  Neurological: Negative for dizziness, tingling and tremors.  Endo/Heme/Allergies: Negative for environmental allergies. Does not bruise/bleed easily.  Psychiatric/Behavioral: Negative for depression and hallucinations.    MEDICAL HISTORY:  Past Medical History:  Diagnosis Date  . Allergy   . Arthritis    rheumatoid arthritis  . Breast cancer ([place]) 05/2018   Right breast found 1st, then left breast with ductal ca  . Cellulitis of breast 05/2018   right breast cellulitis after first biopsy,  antibiotics prescribed  . COPD (chronic obstructive pulmonary disease) ([place])   . Diabetes mellitus without complication ([place])   . Dyspnea   . GERD (gastroesophageal reflux disease)    occasionally  . History of kidney stones    has one but not a problem  . Hypothyroidism   . Oxygen dependent    2liter nasal prong continuously    SURGICAL HISTORY: Past Surgical History:  Procedure Laterality Date  . APPENDECTOMY    . BACK SURGERY     lumbar. herniated disc . no metal  . BREAST BIOPSY  Right 03/19/2018   INVASIVE MAMMARY CARCINOMA, NO SPECIAL TYPE  . BREAST BIOPSY Left 06/01/2018   MRI bx, grade III invasive ductal carcinoma  . BREAST BIOPSY Right 06/01/2018   MRI bx of enhancing mass, path showed FOREIGN BODY GRANULOMATOUS RESPONSE   . BREAST LUMPECTOMY  Right 04/16/2018   [facility], DCIS, LN negative  . BREAST LUMPECTOMY Right 07/03/2018   Procedure: RE-EXCISION OF RIGHT BREAST TISSUE MASS;  Surgeon: Benjamine Sprague, DO;  Location: [facility] ORS;  Service: General;  Laterality: Right;  . BREAST LUMPECTOMY Left 06/2018   invasive ductal  . IRRIGATION AND DEBRIDEMENT HEMATOMA Left 07/10/2018   Procedure: IRRIGATION AND DEBRIDEMENT HEMATOMA LEFT AXILLARY;  Surgeon: Benjamine Sprague, DO;  Location: [facility] ORS;  Service: General;  Laterality: Left;  . PARTIAL MASTECTOMY WITH NEEDLE LOCALIZATION AND AXILLARY SENTINEL LYMPH NODE BX Right 04/16/2018   Procedure: PARTIAL MASTECTOMY WITH NEEDLE LOCALIZATION AND AXILLARY SENTINEL LYMPH NODE BX;  Surgeon: Benjamine Sprague, DO;  Location: [facility] ORS;  Service: General;  Laterality: Right;  . PARTIAL MASTECTOMY WITH NEEDLE LOCALIZATION AND AXILLARY SENTINEL LYMPH NODE BX Left 07/03/2018   Procedure: PARTIAL MASTECTOMY WITH NEEDLE LOCALIZATION AND SENTINEL LYMPH NODE BX;  Surgeon: Benjamine Sprague, DO;  Location: [facility] ORS;  Service: General;  Laterality: Left;    SOCIAL HISTORY: Social History   Socioeconomic History  . Marital status: Married    Spouse name: richard..disabled  . Number of children: Not on file  . Years of education: Not on file  . Highest education level: Not on file  Occupational History  . 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 Financial resource strain: Not hard at all  . Food insecurity    Worry: Never true    Inability: Never true  . Transportation needs    Medical: No    Non-medical: No  Tobacco Use  . Smoking status: Former Smoker    Types: Cigarettes    Quit date: 03/30/1993    Years since quitting: 26.2  . Smokeless tobacco: Never Used  Substance and Sexual Activity  . Alcohol use: No  . Drug use: Never  . Sexual activity: Not Currently  Lifestyle  . Physical activity    Days per week: 7 days    Minutes per session: 30 min  . Stress: Not at all   Relationships  . Social connections    Talks on phone: More than three times a week    Gets together: More than three times a week    Attends religious service: 1 to 4 times per year    Active member of club or organization: No    Attends meetings of clubs or organizations: Never    Relationship status: Married  . Intimate partner violence    Fear of current or ex partner: No    Emotionally abused: No    Physically abused: No    Forced sexual activity: No  Other Topics Concern  . Not on file  Social History Narrative  . Not on file    FAMILY HISTORY: Family History  Problem Relation Age of Onset  . Clotting disorder Mother   . Melanoma Mother        deceased 34  . Lung cancer Sister        deceased 36s; smoker  . Alcohol abuse Father        deceased 79  . Cervical cancer Other        neice  . Breast  cancer Neg Hx     ALLERGIES:  is allergic to sulfa antibiotics.  MEDICATIONS:  Current Outpatient Medications  Medication Sig Dispense Refill  . anastrozole (ARIMIDEX) 1 MG tablet Take 1 tablet (1 mg total) by mouth daily. 90 tablet 3  . azelastine (ASTELIN) 0.1 % nasal spray Place 1 spray into both nostrils 2 (two) times daily.     . Calcium Carbonate-Vit D-Min (CALCIUM 1200) 1200-1000 MG-UNIT CHEW Chew 1 tablet by mouth daily. 90 each 1  . folic acid (FOLVITE) 1 MG tablet Take 1 mg by mouth daily.    [name] glipiZIDE (GLUCOTROL XL) 5 MG 24 hr tablet Take 1 tablet by mouth daily.    . hydroxychloroquine (PLAQUENIL) 200 MG tablet Take 400 mg by mouth daily.     . irbesartan (AVAPRO) 75 MG tablet Take 75 mg by mouth daily.    [name] levothyroxine (SYNTHROID, LEVOTHROID) 50 MCG tablet Take 50 mcg by mouth daily before breakfast.    . losartan (COZAAR) 25 MG tablet Take 1 tablet by mouth daily.    [name] omeprazole (PRILOSEC) 20 MG capsule Take 20 mg by mouth daily as needed.     . budesonide-formoterol (SYMBICORT) 160-4.5 MCG/ACT inhaler Inhale 2 puffs into the lungs 2 (two) times daily.       No current facility-administered medications for this visit.      PHYSICAL EXAMINATION: ECOG PERFORMANCE STATUS: 1 - Symptomatic but completely ambulatory Vitals:   06/08/19 0942  BP: (!) 133/51  Pulse: 90  Temp: 99.2 F (37.3 C)  SpO2: 95%   Filed Weights   06/08/19 0942  Weight: 161 lb 9 oz (73.3 kg)    Physical Exam Constitutional:      General: She is not in acute distress.    Comments: On home oxygen  HENT:     Head: Normocephalic and atraumatic.  Eyes:     General: No scleral icterus.    Conjunctiva/sclera: Conjunctivae normal.     Pupils: Pupils are equal, round, and reactive to light.  Neck:     Musculoskeletal: Normal range of motion and neck supple.  Cardiovascular:     Rate and Rhythm: Normal rate and regular rhythm.     Heart sounds: Normal heart sounds.  Pulmonary:     Effort: Pulmonary effort is normal. No respiratory distress.     Breath sounds: No wheezing.  Abdominal:     General: Bowel sounds are normal. There is no distension.     Palpations: Abdomen is soft. There is no mass.     Tenderness: There is no abdominal tenderness.  Musculoskeletal: Normal range of motion.        General: No deformity.  Lymphadenopathy:     Cervical: No cervical adenopathy.  Skin:    General: Skin is warm and dry.     Findings: No erythema or rash.  Neurological:     Mental Status: She is alert and oriented to person, place, and time.     Cranial Nerves: No cranial nerve deficit.     Coordination: Coordination normal.  Psychiatric:        Behavior: Behavior normal.        Thought Content: Thought content normal.        LABORATORY DATA:  I have reviewed the data as listed Lab Results  Component Value Date   WBC 7.6 06/08/2019   HGB 12.3 06/08/2019   HCT 40.0 06/08/2019   MCV 88.1 06/08/2019   PLT 273 06/08/2019   Recent Labs  12/22/18 1006 02/22/19 1302 06/08/19 0924  NA 139 140 141  K 4.1 4.2 4.1  CL 101 99 99  CO2 34* 32 34*   GLUCOSE 136* 127* 181*  BUN '14 15 15  '$ CREATININE 0.65 1.09* 0.91  CALCIUM 9.1 9.0 9.1  GFRNONAA >60 49* >60  GFRAA >60 57* >60  PROT 7.0 7.0 7.4  ALBUMIN 4.0 4.2 4.3  AST '18 17 18  '$ ALT '17 15 15  '$ ALKPHOS 48 47 53  BILITOT 0.5 0.4 0.5   Iron/TIBC/Ferritin/ %Sat No results found for: IRON, TIBC, FERRITIN, IRONPCTSAT      ASSESSMENT & PLAN:  1. Invasive carcinoma of breast ([place])   2. Mass of chest wall   3. Ductal carcinoma in situ (DCIS) of left breast   4. Osteopenia, unspecified location   5. Aromatase inhibitor use   6. Vitamin D deficiency    #Right invasive carcinoma of breast and the left DCIS Patient tolerates adjuvant aromatase inhibitor Arimidex daily.  Continue. Manageable chronic joint pain and hot flashes. Bilateral diagnostic mammogram was independently reviewed by me and discussed with patient.  No evidence of radiographic abnormality. Recommend obtain bilateral breast mammogram as the left breast DCIS was mammographic occult  Bone density osteopenia.  Continue calcium and vitamin D supplementation.   Recommend patient to obtain dental clearance for starting bisphosphonate treatments.   #Vitamin D deficiency, vitamin D level at 39.  Will recommend patient to start taking oral vitamin D 3000 units daily.  She is currently taking combination of calcium carbonate 1200 mg and vitamin D 1000 unit daily.  I will send her prescription of vitamin D 2000 units daily.  Repeat vitamin D level in 3 months.  #Posterior neck/back lump, possible fatty cyst.  Will obtain ultrasound soft tissue for further evaluation. We spent sufficient time to discuss many aspect of care, questions were answered to patient's satisfaction.  Orders Placed This Encounter  Procedures  . MR BREAST BILATERAL W WO CONTRAST INC CAD    Standing Status:   Future    Standing Expiration Date:   08/07/2020    Order Specific Question:   If indicated for the ordered procedure, I authorize the  administration of contrast media per Radiology protocol    Answer:   Yes    Order Specific Question:   What is the patient's sedation requirement?    Answer:   No Sedation    Order Specific Question:   Does the patient have a pacemaker or implanted devices?    Answer:   No    Order Specific Question:   Radiology Contrast Protocol - do NOT remove file path    Answer:   [email or web address]    Order Specific Question:   Preferred imaging location?    Answer:   [facility] (table limit-400lbs)  . [place] CHEST SOFT TISSUE    Standing Status:   Future    Standing Expiration Date:   08/07/2020    Order Specific Question:   Reason for Exam (SYMPTOM  OR DIAGNOSIS REQUIRED)    Answer:   left posterior chest wall mass    Order Specific Question:   Preferred imaging location?    Answer:   [facility]  . CBC with Differential/Platelet    Standing Status:   Future    Standing Expiration Date:   06/07/2020  . Comprehensive metabolic panel    Standing Status:   Future    Standing Expiration Date:   06/07/2020    All questions were  answered. The patient knows to call the clinic with any problems questions or concerns.  Return of visit: 3 months.   Earlie Server, MD, PhD 06/08/2019

## 2019-06-09 ENCOUNTER — Telehealth: Payer: Self-pay

## 2019-06-09 NOTE — Telephone Encounter (Signed)
Pt informed

## 2019-06-09 NOTE — Telephone Encounter (Signed)
-----   Message from Earlie Server, MD sent at 06/08/2019 11:32 PM EDT ----- Please let patient know that her vitamin D level is low.  Recommend patient to continue take her combination calcium and vitamin D pill, in addition she can take vitamin D 2000 units daily.  I have sent a prescription to her pharmacy.  Thank you

## 2019-06-09 NOTE — Telephone Encounter (Signed)
Opened in error

## 2019-06-12 DIAGNOSIS — Z23 Encounter for immunization: Secondary | ICD-10-CM | POA: Diagnosis not present

## 2019-06-15 ENCOUNTER — Ambulatory Visit
Admission: RE | Admit: 2019-06-15 | Discharge: 2019-06-15 | Disposition: A | Discharge status: Home / Self Care | Payer: PPO | Source: Ambulatory Visit | Attending: Oncology | Admitting: Oncology

## 2019-06-15 ENCOUNTER — Other Ambulatory Visit: Payer: Self-pay

## 2019-06-15 DIAGNOSIS — R222 Localized swelling, mass and lump, trunk: Secondary | ICD-10-CM | POA: Diagnosis not present

## 2019-06-15 DIAGNOSIS — R221 Localized swelling, mass and lump, neck: Secondary | ICD-10-CM | POA: Diagnosis not present

## 2019-06-17 ENCOUNTER — Telehealth: Payer: Self-pay

## 2019-06-17 NOTE — Progress Notes (Signed)
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  If it does not bother you, no need to do anything right now.  If it bothers you or gets infected, you can discuss with your pcp and maybe see a surgeon to have it removed.  Regards,   Dr.Marcques Wrightsman

## 2019-06-17 NOTE — Telephone Encounter (Signed)
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  If it does not bother you, no need to do anything right now.  If it bothers you or gets infected, you can discuss with your pcp and maybe see a surgeon to have it removed.  Regards,   Dr.Yu

## 2019-06-17 NOTE — Telephone Encounter (Signed)
Patient informed. 

## 2019-06-21 ENCOUNTER — Ambulatory Visit
Admission: RE | Admit: 2019-06-21 | Discharge: 2019-06-21 | Disposition: A | Discharge status: Home / Self Care | Payer: PPO | Source: Ambulatory Visit | Attending: Oncology | Admitting: Oncology

## 2019-06-21 DIAGNOSIS — C50919 Malignant neoplasm of unspecified site of unspecified female breast: Secondary | ICD-10-CM | POA: Insufficient documentation

## 2019-06-21 DIAGNOSIS — Z853 Personal history of malignant neoplasm of breast: Secondary | ICD-10-CM | POA: Diagnosis not present

## 2019-06-21 MED ORDER — GADOBUTROL 1 MMOL/ML IV SOLN
7.5000 mL | Freq: Once | INTRAVENOUS | Status: AC | PRN
  Administered 2019-06-21: 09:00:00 7.5 mL via INTRAVENOUS

## 2019-06-23 DIAGNOSIS — J449 Chronic obstructive pulmonary disease, unspecified: Secondary | ICD-10-CM | POA: Diagnosis not present

## 2019-07-06 DIAGNOSIS — E034 Atrophy of thyroid (acquired): Secondary | ICD-10-CM | POA: Diagnosis not present

## 2019-07-06 DIAGNOSIS — E119 Type 2 diabetes mellitus without complications: Secondary | ICD-10-CM | POA: Diagnosis not present

## 2019-07-13 DIAGNOSIS — G2581 Restless legs syndrome: Secondary | ICD-10-CM | POA: Diagnosis not present

## 2019-07-13 DIAGNOSIS — J449 Chronic obstructive pulmonary disease, unspecified: Secondary | ICD-10-CM | POA: Diagnosis not present

## 2019-07-13 DIAGNOSIS — Z0001 Encounter for general adult medical examination with abnormal findings: Secondary | ICD-10-CM | POA: Diagnosis not present

## 2019-07-13 DIAGNOSIS — E034 Atrophy of thyroid (acquired): Secondary | ICD-10-CM | POA: Diagnosis not present

## 2019-07-13 DIAGNOSIS — I10 Essential (primary) hypertension: Secondary | ICD-10-CM | POA: Diagnosis not present

## 2019-07-13 DIAGNOSIS — E119 Type 2 diabetes mellitus without complications: Secondary | ICD-10-CM | POA: Diagnosis not present

## 2019-07-19 DIAGNOSIS — Z1159 Encounter for screening for other viral diseases: Secondary | ICD-10-CM | POA: Diagnosis not present

## 2019-07-21 DIAGNOSIS — J449 Chronic obstructive pulmonary disease, unspecified: Secondary | ICD-10-CM | POA: Diagnosis not present

## 2019-07-21 DIAGNOSIS — Z9981 Dependence on supplemental oxygen: Secondary | ICD-10-CM | POA: Diagnosis not present

## 2019-07-21 DIAGNOSIS — R0600 Dyspnea, unspecified: Secondary | ICD-10-CM | POA: Diagnosis not present

## 2019-07-24 DIAGNOSIS — J449 Chronic obstructive pulmonary disease, unspecified: Secondary | ICD-10-CM | POA: Diagnosis not present

## 2019-08-02 DIAGNOSIS — C50512 Malignant neoplasm of lower-outer quadrant of left female breast: Secondary | ICD-10-CM | POA: Diagnosis not present

## 2019-08-02 DIAGNOSIS — M0579 Rheumatoid arthritis with rheumatoid factor of multiple sites without organ or systems involvement: Secondary | ICD-10-CM | POA: Diagnosis not present

## 2019-08-02 DIAGNOSIS — J449 Chronic obstructive pulmonary disease, unspecified: Secondary | ICD-10-CM | POA: Diagnosis not present

## 2019-08-23 DIAGNOSIS — J449 Chronic obstructive pulmonary disease, unspecified: Secondary | ICD-10-CM | POA: Diagnosis not present

## 2019-09-01 ENCOUNTER — Inpatient Hospital Stay: Payer: PPO | Attending: Oncology

## 2019-09-01 ENCOUNTER — Encounter: Payer: Self-pay | Admitting: Oncology

## 2019-09-01 ENCOUNTER — Inpatient Hospital Stay (HOSPITAL_BASED_OUTPATIENT_CLINIC_OR_DEPARTMENT_OTHER): Payer: PPO | Admitting: Oncology

## 2019-09-01 ENCOUNTER — Other Ambulatory Visit: Payer: Self-pay

## 2019-09-01 VITALS — BP 131/59 | HR 83 | Temp 96.7°F | Resp 18 | Wt 160.2 lb

## 2019-09-01 DIAGNOSIS — Z9981 Dependence on supplemental oxygen: Secondary | ICD-10-CM | POA: Insufficient documentation

## 2019-09-01 DIAGNOSIS — Z87442 Personal history of urinary calculi: Secondary | ICD-10-CM | POA: Diagnosis not present

## 2019-09-01 DIAGNOSIS — C50811 Malignant neoplasm of overlapping sites of right female breast: Secondary | ICD-10-CM | POA: Insufficient documentation

## 2019-09-01 DIAGNOSIS — Z811 Family history of alcohol abuse and dependence: Secondary | ICD-10-CM | POA: Insufficient documentation

## 2019-09-01 DIAGNOSIS — Z808 Family history of malignant neoplasm of other organs or systems: Secondary | ICD-10-CM | POA: Diagnosis not present

## 2019-09-01 DIAGNOSIS — D171 Benign lipomatous neoplasm of skin and subcutaneous tissue of trunk: Secondary | ICD-10-CM | POA: Diagnosis not present

## 2019-09-01 DIAGNOSIS — Z8049 Family history of malignant neoplasm of other genital organs: Secondary | ICD-10-CM | POA: Diagnosis not present

## 2019-09-01 DIAGNOSIS — D0512 Intraductal carcinoma in situ of left breast: Secondary | ICD-10-CM

## 2019-09-01 DIAGNOSIS — M8580 Other specified disorders of bone density and structure, unspecified site: Secondary | ICD-10-CM

## 2019-09-01 DIAGNOSIS — Z79899 Other long term (current) drug therapy: Secondary | ICD-10-CM | POA: Insufficient documentation

## 2019-09-01 DIAGNOSIS — E559 Vitamin D deficiency, unspecified: Secondary | ICD-10-CM | POA: Diagnosis not present

## 2019-09-01 DIAGNOSIS — G8929 Other chronic pain: Secondary | ICD-10-CM | POA: Insufficient documentation

## 2019-09-01 DIAGNOSIS — M069 Rheumatoid arthritis, unspecified: Secondary | ICD-10-CM | POA: Insufficient documentation

## 2019-09-01 DIAGNOSIS — Z79811 Long term (current) use of aromatase inhibitors: Secondary | ICD-10-CM | POA: Diagnosis not present

## 2019-09-01 DIAGNOSIS — Z882 Allergy status to sulfonamides status: Secondary | ICD-10-CM | POA: Insufficient documentation

## 2019-09-01 DIAGNOSIS — Z801 Family history of malignant neoplasm of trachea, bronchus and lung: Secondary | ICD-10-CM | POA: Diagnosis not present

## 2019-09-01 DIAGNOSIS — R232 Flushing: Secondary | ICD-10-CM | POA: Diagnosis not present

## 2019-09-01 DIAGNOSIS — C50919 Malignant neoplasm of unspecified site of unspecified female breast: Secondary | ICD-10-CM | POA: Diagnosis not present

## 2019-09-01 DIAGNOSIS — Z87891 Personal history of nicotine dependence: Secondary | ICD-10-CM | POA: Diagnosis not present

## 2019-09-01 DIAGNOSIS — Z832 Family history of diseases of the blood and blood-forming organs and certain disorders involving the immune mechanism: Secondary | ICD-10-CM | POA: Diagnosis not present

## 2019-09-01 LAB — CBC WITH DIFFERENTIAL/PLATELET
Abs Immature Granulocytes: 0.01 K/uL (ref 0.00–0.07)
Basophils Absolute: 0.1 K/uL (ref 0.0–0.1)
Basophils Relative: 2 %
Eosinophils Absolute: 0.3 K/uL (ref 0.0–0.5)
Eosinophils Relative: 6 %
HCT: 41.5 % (ref 36.0–46.0)
Hemoglobin: 12.6 g/dL (ref 12.0–15.0)
Immature Granulocytes: 0 %
Lymphocytes Relative: 27 %
Lymphs Abs: 1.6 K/uL (ref 0.7–4.0)
MCH: 26.9 pg (ref 26.0–34.0)
MCHC: 30.4 g/dL (ref 30.0–36.0)
MCV: 88.5 fL (ref 80.0–100.0)
Monocytes Absolute: 0.4 K/uL (ref 0.1–1.0)
Monocytes Relative: 7 %
Neutro Abs: 3.5 K/uL (ref 1.7–7.7)
Neutrophils Relative %: 58 %
Platelets: 268 K/uL (ref 150–400)
RBC: 4.69 MIL/uL (ref 3.87–5.11)
RDW: 12.9 % (ref 11.5–15.5)
WBC: 5.9 K/uL (ref 4.0–10.5)
nRBC: 0 % (ref 0.0–0.2)

## 2019-09-01 LAB — COMPREHENSIVE METABOLIC PANEL
ALT: 15 U/L (ref 0–44)
AST: 19 U/L (ref 15–41)
Albumin: 4.3 g/dL (ref 3.5–5.0)
Alkaline Phosphatase: 45 U/L (ref 38–126)
Anion gap: 8 (ref 5–15)
BUN: 13 mg/dL (ref 8–23)
CO2: 34 mmol/L — ABNORMAL HIGH (ref 22–32)
Calcium: 9.3 mg/dL (ref 8.9–10.3)
Chloride: 99 mmol/L (ref 98–111)
Creatinine, Ser: 0.91 mg/dL (ref 0.44–1.00)
GFR calc Af Amer: >60 mL/min (ref >60)
GFR calc non Af Amer: >60 mL/min (ref >60)
Glucose, Bld: 159 mg/dL — ABNORMAL HIGH (ref 70–99)
Potassium: 4 mmol/L (ref 3.5–5.1)
Sodium: 141 mmol/L (ref 135–145)
Total Bilirubin: 0.6 mg/dL (ref 0.3–1.2)
Total Protein: 7.3 g/dL (ref 6.5–8.1)

## 2019-09-01 NOTE — Progress Notes (Signed)
Patient here for follow up. Pt having intermittent left shoulder pain. Pt having soreness under left breast, where incision is.

## 2019-09-01 NOTE — Progress Notes (Signed)
Hematology/Oncology follow up  note [facility] [phone] [phone]   Patient Care Team: Baxter Hire, MD as PCP - General (Internal Medicine) Breast Surgeon Dr.Sakai.   CHIEF COMPLAINTS/REASON FOR VISIT:  Follow up  of breast cancer  HISTORY OF PRESENTING ILLNESS:  Amy Woodard is a  78 y.o.  female with PMH listed below who was referred to me for evaluation of newly diagnosed breast cancer. Patient had mammogram and ultrasound on 03/19/2018 which showed right breast 12:00 1.6 x 1.3 x 1.5 cm mass, no radiographically enlarged adenopathy.  Patient also report feeling a mass for the past couple of months. Biopsy pathology showed: Invasive mammary carcinoma, no special type, grade 3, DCIS present, lymphovascular invasion not identified, ER PR 90% positive, HER-2 negative  Nipple discharge: Denies Family history: Mother had melanoma, passed away at age of 89, sister had cervical cancer.  Sister with colon cancer, maternal grandmother sister OCP use: denies.  Estrogen and progesterone therapy: denies History of radiation to chest: denies.  Previous breast surgery: Denies  She has a complicated course of rest cancer diagnosis and surgery. # 04/16/2018 status post right breast mass lumpectomy and sentinel lymph node biopsy.  Pathology showed invasive mammary carcinoma 1.7 cm, DCIS, high-grade with extensive involvement of lobules.  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  Sentinel lymph node biopsy negative. pT1c pN0. ER >90%, PR >90%, HER 2 negative.   05/14/2018 patient underwent MRI breast bilaterally to determined extent of positive deep margin/lobular type breast cancer. MRI breast showed an irregular 1.4 cm mass in the lower inner quadrant of the left breast. Abnormal non-mass enhancement in the upper outer right breast, axillary tail region could be secondary to postsurgical changes, malignancy  cannot be excluded. 06/05/2018 left breast mass biopsy showed invasive ductal carcinoma, grade 3, [facility] NJaquita Folds ER 100%, PR 50%, not enough tissue for HER 2 analysis.  07/03/2018 patient underwent right breast deep margin lumpectomy/reexcision: Pathology showed negative for residual malignancy. Left breast needle localized lumpectomy showed negative for residual malignancy.  Sentinel lymph node negative In addition the outer slides on this patient's left breast biopsy were requested and reviewed by Dr. RReuel Derby  Dr. RReuel Derbyfeels the patient's biopsy showed high-grade DCIS with focal areas suspicious for invasion.  Definitive invasive carcinoma is not identified.  DCIS is present in both outside blocks and slides from biopsy and measures 7 mm and 6 mm respectively.  pTis pN0 Patient case have been discussed on breast tumor board multiple times with reviewing of images and pathology findings. Left breast MRI did show a 1.4 cm irregularity, and pathology only 7 mm and a 6 mm DCIS were found at the initial left breast biopsy.  Final left breast lumpectomy did not contain clips which was placed with MRI guidance at biopsy.. Per my discussion with surgeon Dr.Sakai, even clips are found on repeat image, re-excision is not feasible and the next step will be mastectomy.   # 07/03/2018-07/06/2018 admitted for pain control, sentinel lymph node biopsy site hematoma,  #07/10/2018 - 07/14/2018 Patient admitted due to acute on chronic anemia due to hematoma.  She was admitted for evacuation of postoperative left axillary sentinel lymph node biopsy site hematoma.  Status post PRBC transfusion during her admission. # 08/14/2018-08/17/2018 readmitted due to breast cellulitis despite outpatient drainage and oral antibiotics.  # 08/24/2018- 08/28/2018 readmitted due to recurrent right breast abscess.  She was placed on IV antibiotics with Ceftriaxone via picc line.  #A  repeat US has been done on 09/07/2018 and it shows near resolution of the fluid. # #Finished IV Ceftriaxone on 09/11/2018.   Patient has a history of chronic respiratory failure/ COPD, on home oxygen.  # Right breast pT1c pN0, reexcision did not reveal any residual disease.. Oncotype DX showed recurrence rate of 31, patient has 15% benefit from adjuvant chemotherapy.  Decision was made not to proceed with adjuvant chemotherapy due to significant delays secondary to surgical complication, slow recovery from surgery, borderline performance status and comorbidities.  Patient declined radiation due to severe lung condition and is afraid of lung toxicities.[name]  #Left breast high-grade DCIS She has been tolerating antiestrogen treatment with Arimidex very well.  Manageable side effects. left breast biopsy clip was not found in the specimen. Continue Arimidex, tolerates well.   #Previously decided to omit chemotherapy due to severe delay secondary to complicated post surgery course, slow recovery from surgery, borderline performance status, comorbidities including chronic respiratory failure. 10/27/2018 left unilateral diagnostic mammogram showed left breast lower inner quadrant still presents results was discussed with patient and patient's surgeon Dr. Lysle Pearl.  Dr. Lysle Pearl feels the only option will be mastectomy and the patient is not interested.   #Started on ArimideX1/05/2019  INTERVAL HISTORY Amy Woodard is a 78 y.o. female who has above history reviewed by me today presents for follow up visit for management of breast cancer. Patient reports feeling well at baseline. She has chronic shortness of breath due to chronic respiratory failure, on home oxygen. She tolerates Arimidex daily.  Mild chronic joint pain and manageable hot flashes. She continues to have left shoulder pain, around the lump which has been worked up by ultrasound which showed a lipoma.  Also reports intermittent soreness under her  left breast, at the incision site.  06/21/2019 patient had bilateral breast MRI which showed no MRI evidence of malignancy.  Review of Systems  Constitutional: Negative for chills, fever, malaise/fatigue and weight loss.  HENT: Negative for nosebleeds and sore throat.   Eyes: Negative for double vision, photophobia and redness.  Respiratory: Positive for shortness of breath. Negative for cough and wheezing.   Cardiovascular: Negative for chest pain, palpitations, orthopnea and leg swelling.  Gastrointestinal: Negative for abdominal pain, blood in stool, nausea and vomiting.  Genitourinary: Negative for dysuria.  Musculoskeletal: Negative for myalgias.       Left shoulder pain around the lump  Skin: Negative for itching and rash.  Neurological: Negative for dizziness, tingling and tremors.  Endo/Heme/Allergies: Negative for environmental allergies. Does not bruise/bleed easily.  Psychiatric/Behavioral: Negative for depression and hallucinations.    MEDICAL HISTORY:  Past Medical History:  Diagnosis Date  . Allergy   . Arthritis    rheumatoid arthritis  . Breast cancer ([place]) 05/2018   Right breast found 1st, then left breast with ductal ca  . Cellulitis of breast 05/2018   right breast cellulitis after first biopsy,  antibiotics prescribed  . COPD (chronic obstructive pulmonary disease) ([place])   . Diabetes mellitus without complication ([place])   . Dyspnea   . GERD (gastroesophageal reflux disease)    occasionally  . History of kidney stones    has one but not a problem  . Hypothyroidism   . Oxygen dependent    2liter nasal prong continuously    SURGICAL HISTORY: Past Surgical History:  Procedure Laterality Date  . APPENDECTOMY    . BACK SURGERY  lumbar. herniated disc . no metal  . BREAST BIOPSY Right 03/19/2018   INVASIVE MAMMARY CARCINOMA, NO SPECIAL TYPE  . BREAST BIOPSY Left 06/01/2018   MRI bx, grade III invasive ductal carcinoma  . BREAST BIOPSY Right  06/01/2018   MRI bx of enhancing mass, path showed FOREIGN BODY GRANULOMATOUS RESPONSE   . 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 BREAST LUMPECTOMY Right 07/03/2018   Procedure: RE-EXCISION OF RIGHT BREAST TISSUE MASS;  Surgeon: Benjamine Sprague, DO;  Location: [facility] ORS;  Service: General;  Laterality: Right;  . BREAST LUMPECTOMY Left 06/2018   invasive ductal  . IRRIGATION AND DEBRIDEMENT HEMATOMA Left 07/10/2018   Procedure: IRRIGATION AND DEBRIDEMENT HEMATOMA LEFT AXILLARY;  Surgeon: Benjamine Sprague, DO;  Location: [facility] ORS;  Service: General;  Laterality: Left;  . PARTIAL MASTECTOMY WITH NEEDLE LOCALIZATION AND AXILLARY SENTINEL LYMPH NODE BX Right 04/16/2018   Procedure: PARTIAL MASTECTOMY WITH NEEDLE LOCALIZATION AND AXILLARY SENTINEL LYMPH NODE BX;  Surgeon: Benjamine Sprague, DO;  Location: [facility] ORS;  Service: General;  Laterality: Right;  . PARTIAL MASTECTOMY WITH NEEDLE LOCALIZATION AND AXILLARY SENTINEL LYMPH NODE BX Left 07/03/2018   Procedure: PARTIAL MASTECTOMY WITH NEEDLE LOCALIZATION AND SENTINEL LYMPH NODE BX;  Surgeon: Benjamine Sprague, DO;  Location: [facility] ORS;  Service: General;  Laterality: Left;    SOCIAL HISTORY: Social History   Socioeconomic History  . Marital status: Married    Spouse name: richard..disabled  . Number of children: Not on file  . Years of education: Not on file  . Highest education level: Not on file  Occupational History  . 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 Smoking status: Former Smoker    Types: Cigarettes    Quit date: 03/30/1993    Years since quitting: 26.4  . Smokeless tobacco: Never Used  Substance and Sexual Activity  . Alcohol use: No  . Drug use: Never  . Sexual activity: Not Currently  Other Topics Concern  . Not on file  Social History Narrative  . Not on file   Social Determinants of Health   Financial Resource Strain:   . Difficulty of Paying Living Expenses: Not on file    Food Insecurity:   . Worried About Charity fundraiser in the Last Year: Not on file  . Ran Out of Food in the Last Year: Not on file  Transportation Needs:   . Lack of Transportation (Medical): Not on file  . Lack of Transportation (Non-Medical): Not on file  Physical Activity:   . Days of Exercise per Week: Not on file  . Minutes of Exercise per Session: Not on file  Stress:   . Feeling of Stress : Not on file  Social Connections:   . Frequency of Communication with Friends and Family: Not on file  . Frequency of Social Gatherings with Friends and Family: Not on file  . Attends Religious Services: Not on file  . Active Member of Clubs or Organizations: Not on file  . Attends Archivist Meetings: Not on file  . Marital Status: Not on file  Intimate Partner Violence:   . Fear of Current or Ex-Partner: Not on file  . Emotionally Abused: Not on file  . Physically Abused: Not on file  . Sexually Abused: Not on file    FAMILY HISTORY: Family History  Problem Relation Age of Onset  . Clotting disorder Mother   . Melanoma Mother  deceased 45  . Lung cancer Sister        deceased 42s; smoker  . Alcohol abuse Father        deceased 71  . Cervical cancer Other        neice  . Breast cancer Neg Hx     ALLERGIES:  is allergic to sulfa antibiotics.  MEDICATIONS:  Current Outpatient Medications  Medication Sig Dispense Refill  . anastrozole (ARIMIDEX) 1 MG tablet Take 1 tablet (1 mg total) by mouth daily. 90 tablet 3  . azelastine (ASTELIN) 0.1 % nasal spray Place 1 spray into both nostrils 2 (two) times daily.     . budesonide-formoterol (SYMBICORT) 160-4.5 MCG/ACT inhaler Inhale 2 puffs into the lungs 2 (two) times daily.     . Calcium Carbonate-Vit D-Min (CALCIUM 1200) 1200-1000 MG-UNIT CHEW Chew 1 tablet by mouth daily. 90 each 1  . Cholecalciferol (VITAMIN D) 50 MCG (2000 UT) tablet Take 1 tablet (2,000 Units total) by mouth daily. 90 tablet 0  . folic acid  (FOLVITE) 1 MG tablet Take 1 mg by mouth daily.    [name] glipiZIDE (GLUCOTROL XL) 5 MG 24 hr tablet Take 1 tablet by mouth daily.    . hydroxychloroquine (PLAQUENIL) 200 MG tablet Take 400 mg by mouth daily.     . Ipratropium-Albuterol (COMBIVENT) 20-100 MCG/ACT AERS respimat Inhale into the lungs.    . irbesartan (AVAPRO) 75 MG tablet Take 75 mg by mouth daily.    [name] levothyroxine (SYNTHROID, LEVOTHROID) 50 MCG tablet Take 50 mcg by mouth daily before breakfast.    . losartan (COZAAR) 25 MG tablet Take 1 tablet by mouth daily.    [name] omeprazole (PRILOSEC) 20 MG capsule Take 20 mg by mouth daily as needed.      No current facility-administered medications for this visit.     PHYSICAL EXAMINATION: ECOG PERFORMANCE STATUS: 1 - Symptomatic but completely ambulatory Vitals:   09/01/19 0901  BP: (!) 131/59  Pulse: 83  Resp: 18  Temp: (!) 96.7 F (35.9 C)  SpO2: 96%   Filed Weights   09/01/19 0901  Weight: 160 lb 3.2 oz (72.7 kg)    Physical Exam Constitutional:      General: She is not in acute distress.    Comments: On home oxygen  HENT:     Head: Normocephalic and atraumatic.  Eyes:     General: No scleral icterus.    Conjunctiva/sclera: Conjunctivae normal.     Pupils: Pupils are equal, round, and reactive to light.  Cardiovascular:     Rate and Rhythm: Normal rate and regular rhythm.     Heart sounds: Normal heart sounds.  Pulmonary:     Effort: Pulmonary effort is normal. No respiratory distress.     Comments: Decreased breath sound bilaterally Abdominal:     General: Bowel sounds are normal. There is no distension.     Palpations: Abdomen is soft. There is no mass.     Tenderness: There is no abdominal tenderness.  Musculoskeletal:        General: No deformity. Normal range of motion.     Cervical back: Normal range of motion and neck supple.  Lymphadenopathy:     Cervical: No cervical adenopathy.  Skin:    General: Skin is warm and dry.     Findings: No erythema or  rash.  Neurological:     General: No focal deficit present.     Mental Status: She is alert and oriented to person, place,  and time.     Cranial Nerves: No cranial nerve deficit.     Coordination: Coordination normal.  Psychiatric:        Mood and Affect: Mood normal.    Breast exam was performed in seated and lying down position. Patient is status post bilateral lumpectomy, No palpable right breast mass or palpable axillary lymphadenopathy. Left breast tissue thickening around incision scar, no palpable left axillary    LABORATORY DATA:  I have reviewed the data as listed Lab Results  Component Value Date   WBC 5.9 09/01/2019   HGB 12.6 09/01/2019   HCT 41.5 09/01/2019   MCV 88.5 09/01/2019   PLT 268 09/01/2019   Recent Labs    02/22/19 1302 06/08/19 0924 09/01/19 0839  NA 140 141 141  K 4.2 4.1 4.0  CL 99 99 99  CO2 32 34* 34*  GLUCOSE 127* 181* 159*  BUN _0 CREATININE 1.09* 0.91 0.91  CALCIUM 9.0 9.1 9.3  GFRNONAA 49* >60 >60  GFRAA 57* >60 >60  PROT 7.0 7.4 7.3  ALBUMIN 4.2 4.3 4.3  AST _1 ALT _2 ALKPHOS 47 53 45  BILITOT 0.4 0.5 0.6   Iron/TIBC/Ferritin/ %Sat No results found for: IRON, TIBC, FERRITIN, IRONPCTSAT      ASSESSMENT & PLAN:  1. Invasive carcinoma of breast ([place])   2. Vitamin D deficiency   3. Lipoma of torso   4. Aromatase inhibitor use   5. Ductal carcinoma in situ (DCIS) of left breast   6. Osteopenia, unspecified location    #Right invasive carcinoma of breast and the left DCIS Patient is clinically doing well.  Tolerates adjuvant Arimidex daily.  Continue. She has manageable chronic joint pain and hot flash Bilateral MRI breast images were independently reviewed by me and discussed with patient.  No MRI evidence of malignancy Plan annual diagnostic mammogram alternating with annual bilateral MRI breast given that her breast cancer was mammogram occult.  She is due for bilateral diagnostic mammogram in  August 2021  #Osteopenia  Recommend patient to continue calcium and vitamin D supplementation. She has vitamin D deficiency and has been on vitamin D 3000 units daily.  Repeat vitamin D level at the next visit. Discussed at length the risk factors [race, post-menopausal status, use of AI]; prevention/treatment strategies- including but not limited to exercise calcium and vitamin D twice a day. Recommend  Zometa or Prolia.   Discussed the potential side effects including but not limited to- hypocalcemia and osteonecrosis of the jaw, discussed not to have invasive dental procedures few weeks around the time of the injections.  Recommend patient to obtain dental clearance.  Patient feels that due to the Covid pandemic, she would like to defer until pandemic is over.  #Posterior neck/back lump, ultrasound showed lipoma.  Recommend patient to follow-up with primary care provider.  If it continues to bother her she needs to see a surgery for evaluation and resection. We spent sufficient time to discuss many aspect of care, questions were answered to patient's satisfaction.  Orders Placed This Encounter  Procedures  . CBC with Differential    Standing Status:   Future    Standing Expiration Date:   08/31/2020  . Comprehensive metabolic panel    Standing Status:   Future    Standing Expiration Date:   08/31/2020  . Vitamin D 25 hydroxy    Standing Status:   Future    Standing Expiration Date:  08/31/2020    All questions were answered. The patient knows to call the clinic with any problems questions or concerns.  Return of visit: 4 months.  Earlie Server, MD, PhD 09/01/2019

## 2019-09-08 ENCOUNTER — Other Ambulatory Visit: Payer: Self-pay | Admitting: Oncology

## 2019-09-23 DIAGNOSIS — J449 Chronic obstructive pulmonary disease, unspecified: Secondary | ICD-10-CM | POA: Diagnosis not present

## 2019-11-08 DIAGNOSIS — E119 Type 2 diabetes mellitus without complications: Secondary | ICD-10-CM | POA: Diagnosis not present

## 2019-11-08 DIAGNOSIS — E034 Atrophy of thyroid (acquired): Secondary | ICD-10-CM | POA: Diagnosis not present

## 2019-11-15 DIAGNOSIS — E7800 Pure hypercholesterolemia, unspecified: Secondary | ICD-10-CM | POA: Diagnosis not present

## 2019-11-15 DIAGNOSIS — G2581 Restless legs syndrome: Secondary | ICD-10-CM | POA: Diagnosis not present

## 2019-11-15 DIAGNOSIS — J9611 Chronic respiratory failure with hypoxia: Secondary | ICD-10-CM | POA: Diagnosis not present

## 2019-11-15 DIAGNOSIS — E119 Type 2 diabetes mellitus without complications: Secondary | ICD-10-CM | POA: Diagnosis not present

## 2019-11-15 DIAGNOSIS — J449 Chronic obstructive pulmonary disease, unspecified: Secondary | ICD-10-CM | POA: Diagnosis not present

## 2019-11-15 DIAGNOSIS — Z0000 Encounter for general adult medical examination without abnormal findings: Secondary | ICD-10-CM | POA: Diagnosis not present

## 2019-11-15 DIAGNOSIS — E034 Atrophy of thyroid (acquired): Secondary | ICD-10-CM | POA: Diagnosis not present

## 2019-11-15 DIAGNOSIS — I10 Essential (primary) hypertension: Secondary | ICD-10-CM | POA: Diagnosis not present

## 2019-11-24 DIAGNOSIS — Z9981 Dependence on supplemental oxygen: Secondary | ICD-10-CM | POA: Diagnosis not present

## 2019-11-24 DIAGNOSIS — J310 Chronic rhinitis: Secondary | ICD-10-CM | POA: Diagnosis not present

## 2019-11-24 DIAGNOSIS — R0600 Dyspnea, unspecified: Secondary | ICD-10-CM | POA: Diagnosis not present

## 2019-11-24 DIAGNOSIS — J432 Centrilobular emphysema: Secondary | ICD-10-CM | POA: Diagnosis not present

## 2019-12-02 DIAGNOSIS — J449 Chronic obstructive pulmonary disease, unspecified: Secondary | ICD-10-CM | POA: Diagnosis not present

## 2019-12-23 ENCOUNTER — Other Ambulatory Visit: Payer: Self-pay | Admitting: Oncology

## 2019-12-31 ENCOUNTER — Inpatient Hospital Stay (HOSPITAL_BASED_OUTPATIENT_CLINIC_OR_DEPARTMENT_OTHER): Payer: PPO | Admitting: Oncology

## 2019-12-31 ENCOUNTER — Other Ambulatory Visit: Payer: Self-pay

## 2019-12-31 ENCOUNTER — Encounter: Payer: Self-pay | Admitting: Oncology

## 2019-12-31 ENCOUNTER — Inpatient Hospital Stay: Payer: PPO | Attending: Oncology

## 2019-12-31 VITALS — BP 130/56 | HR 85 | Temp 97.2°F | Resp 18 | Wt 159.5 lb

## 2019-12-31 DIAGNOSIS — Z79811 Long term (current) use of aromatase inhibitors: Secondary | ICD-10-CM | POA: Diagnosis not present

## 2019-12-31 DIAGNOSIS — C50811 Malignant neoplasm of overlapping sites of right female breast: Secondary | ICD-10-CM | POA: Insufficient documentation

## 2019-12-31 DIAGNOSIS — C50919 Malignant neoplasm of unspecified site of unspecified female breast: Secondary | ICD-10-CM

## 2019-12-31 DIAGNOSIS — M8580 Other specified disorders of bone density and structure, unspecified site: Secondary | ICD-10-CM | POA: Insufficient documentation

## 2019-12-31 DIAGNOSIS — Z170 Estrogen receptor positive status [ER+]: Secondary | ICD-10-CM | POA: Insufficient documentation

## 2019-12-31 DIAGNOSIS — E559 Vitamin D deficiency, unspecified: Secondary | ICD-10-CM

## 2019-12-31 LAB — CBC WITH DIFFERENTIAL/PLATELET
Abs Immature Granulocytes: 0.03 10*3/uL (ref 0.00–0.07)
Basophils Absolute: 0.1 10*3/uL (ref 0.0–0.1)
Basophils Relative: 1 %
Eosinophils Absolute: 0.2 10*3/uL (ref 0.0–0.5)
Eosinophils Relative: 3 %
HCT: 38.5 % (ref 36.0–46.0)
Hemoglobin: 12.1 g/dL (ref 12.0–15.0)
Immature Granulocytes: 0 %
Lymphocytes Relative: 19 %
Lymphs Abs: 1.6 10*3/uL (ref 0.7–4.0)
MCH: 27.4 pg (ref 26.0–34.0)
MCHC: 31.4 g/dL (ref 30.0–36.0)
MCV: 87.3 fL (ref 80.0–100.0)
Monocytes Absolute: 0.5 10*3/uL (ref 0.1–1.0)
Monocytes Relative: 6 %
Neutro Abs: 6 10*3/uL (ref 1.7–7.7)
Neutrophils Relative %: 71 %
Platelets: 307 10*3/uL (ref 150–400)
RBC: 4.41 MIL/uL (ref 3.87–5.11)
RDW: 13.2 % (ref 11.5–15.5)
WBC: 8.5 10*3/uL (ref 4.0–10.5)
nRBC: 0 % (ref 0.0–0.2)

## 2019-12-31 LAB — COMPREHENSIVE METABOLIC PANEL
ALT: 13 U/L (ref 0–44)
AST: 17 U/L (ref 15–41)
Albumin: 4.1 g/dL (ref 3.5–5.0)
Alkaline Phosphatase: 53 U/L (ref 38–126)
Anion gap: 10 (ref 5–15)
BUN: 14 mg/dL (ref 8–23)
CO2: 35 mmol/L — ABNORMAL HIGH (ref 22–32)
Calcium: 9.3 mg/dL (ref 8.9–10.3)
Chloride: 96 mmol/L — ABNORMAL LOW (ref 98–111)
Creatinine, Ser: 0.83 mg/dL (ref 0.44–1.00)
GFR calc Af Amer: >60 mL/min (ref >60)
GFR calc non Af Amer: >60 mL/min (ref >60)
Glucose, Bld: 187 mg/dL — ABNORMAL HIGH (ref 70–99)
Potassium: 3.9 mmol/L (ref 3.5–5.1)
Sodium: 141 mmol/L (ref 135–145)
Total Bilirubin: 0.5 mg/dL (ref 0.3–1.2)
Total Protein: 7.3 g/dL (ref 6.5–8.1)

## 2019-12-31 LAB — VITAMIN D 25 HYDROXY (VIT D DEFICIENCY, FRACTURES): Vit D, 25-Hydroxy: 44.57 ng/mL (ref 30–100)

## 2019-12-31 NOTE — Progress Notes (Signed)
Patient here for follow up. No new breast concerns.

## 2020-01-01 NOTE — Progress Notes (Signed)
Hematology/Oncology follow up  note Amy Woodard Woodard Amy Woodard Telephone:([phone]) [phone] [phone]   Patient Care Team: Amy Hire, MD as PCP - General (Internal Medicine) Breast Surgeon Amy Woodard.   CHIEF COMPLAINTS/REASON FOR VISIT:  Follow up  of breast cancer  HISTORY OF PRESENTING ILLNESS:  Amy Woodard is a  79 y.o.  female with PMH listed below who was referred to me for evaluation of newly diagnosed breast cancer. Patient had mammogram and ultrasound on 03/19/2018 which showed right breast 12:00 1.6 x 1.3 x 1.5 cm mass, no radiographically enlarged adenopathy.  Patient also report feeling a mass for the past couple of months. Biopsy pathology showed: Invasive mammary carcinoma, no special type, grade 3, DCIS present, lymphovascular invasion not identified, ER PR 90% positive, HER-2 negative  Nipple discharge: Denies Family history: Mother had melanoma, passed away at age of 66, sister had cervical cancer.  Sister with colon cancer, maternal grandmother sister OCP use: denies.  Estrogen and progesterone therapy: denies History of radiation to chest: denies.  Previous breast surgery: Denies  She has a complicated course of rest cancer diagnosis and surgery. # 04/16/2018 status post right breast mass lumpectomy and sentinel lymph node biopsy.  Pathology showed invasive mammary carcinoma 1.7 cm, DCIS, high-grade with extensive involvement of lobules.  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  Sentinel lymph node biopsy negative. pT1c pN0. ER >90%, PR >90%, HER 2 negative.   05/14/2018 patient underwent MRI breast bilaterally to determined extent of positive deep margin/lobular type breast cancer. MRI breast showed an irregular 1.4 cm mass in the lower inner quadrant of the left breast. Abnormal non-mass enhancement in the upper outer right breast, axillary tail region could be secondary to postsurgical changes, malignancy  cannot be excluded. 06/05/2018 left breast mass biopsy showed invasive ductal carcinoma, grade 3, this [facility] ER 100%, PR 50%, not enough tissue for HER 2 analysis.  07/03/2018 patient underwent right breast deep margin lumpectomy/reexcision: Pathology showed negative for residual malignancy. Left breast needle localized lumpectomy showed negative for residual malignancy.  Sentinel lymph node negative In addition the outer slides on this patient's left breast biopsy were requested and reviewed by Amy Woodard  Dr. RReuel Derbyfeels the patient's biopsy showed high-grade DCIS with focal areas suspicious for invasion.  Definitive invasive carcinoma is not identified.  DCIS is present in both outside blocks and slides from biopsy and measures 7 mm and 6 mm respectively.  pTis pN0 Patient case have been discussed on breast tumor board multiple times with reviewing of images and pathology findings. Left breast MRI did show a 1.4 cm irregularity, and pathology only 7 mm and a 6 mm DCIS were found at the initial left breast biopsy.  Final left breast lumpectomy did not contain clips which was placed with MRI guidance at biopsy.. Per my discussion with surgeon Amy Woodard, even clips are found on repeat image, re-excision is not feasible and the next step will be mastectomy.   # 07/03/2018-07/06/2018 admitted for pain control, sentinel lymph node biopsy site hematoma,  #07/10/2018 - 07/14/2018 Patient admitted due to acute on chronic anemia due to hematoma.  She was admitted for evacuation of postoperative left axillary sentinel lymph node biopsy site hematoma.  Status post PRBC transfusion during her admission. # 08/14/2018-08/17/2018 readmitted due to breast cellulitis despite outpatient drainage and oral antibiotics.  # 08/24/2018- 08/28/2018 readmitted due to recurrent right breast abscess.  She was placed on IV antibiotics with Ceftriaxone via picc line.  #A  repeat US has been done on 09/07/2018 and it shows near resolution of the fluid. # #Finished IV Ceftriaxone on 09/11/2018.   Patient has a history of chronic respiratory failure/ COPD, on home oxygen.  # Right breast pT1c pN0, reexcision did not reveal any residual disease.. Oncotype DX showed recurrence rate of 31, patient has 15% benefit from adjuvant chemotherapy.  Decision was made not to proceed with adjuvant chemotherapy due to significant delays secondary to surgical complication, slow recovery from surgery, borderline performance status and comorbidities.  Patient declined radiation due to severe lung condition and is afraid of lung toxicities.[name]  #Left breast high-grade DCIS She has been tolerating antiestrogen treatment with Arimidex very well.  Manageable side effects. left breast biopsy clip was not found in the specimen. Continue Arimidex, tolerates well.   #Previously decided to omit chemotherapy due to severe delay secondary to complicated post surgery course, slow recovery from surgery, borderline performance status, comorbidities including chronic respiratory failure. 10/27/2018 left unilateral diagnostic mammogram showed left breast lower inner quadrant still presents results was discussed with patient and patient's surgeon Amy Woodard.  Amy Woodard feels the only option will be mastectomy and the patient is not interested.  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 Recommend patient to follow-up with primary care provider.  If it continues to bother her she needs to see a surgery for evaluation and resection.  #Started on ArimideX1/05/2019 06/21/2019 patient had bilateral breast MRI which showed no MRI evidence of malignancy.  INTERVAL HISTORY Amy Woodard is a 79 y.o. female who has above history reviewed by me today presents for follow up visit for management of breast cancer. Patient reports feeling well at baseline. Patient reports no new  complaints.  She tolerates Arimidex treatment. Chronic shortness of breath due to respiratory failure on home oxygen.    Review of Systems  Constitutional: Negative for chills, fever, malaise/fatigue and weight loss.  HENT: Negative for nosebleeds and sore throat.   Eyes: Negative for double vision, photophobia and redness.  Respiratory: Positive for shortness of breath. Negative for cough and wheezing.   Cardiovascular: Negative for chest pain, palpitations, orthopnea and leg swelling.  Gastrointestinal: Negative for abdominal pain, blood in stool, nausea and vomiting.  Genitourinary: Negative for dysuria.  Musculoskeletal: Negative for myalgias.  Skin: Negative for itching and rash.  Neurological: Negative for dizziness, tingling and tremors.  Endo/Heme/Allergies: Negative for environmental allergies. Does not bruise/bleed easily.  Psychiatric/Behavioral: Negative for depression and hallucinations.    MEDICAL HISTORY:  Past Medical History:  Diagnosis Date  . Allergy   . Arthritis    rheumatoid arthritis  . Breast cancer ([place]) 05/2018   Right breast found 1st, then left breast with ductal ca  . Cellulitis of breast 05/2018   right breast cellulitis after first biopsy,  antibiotics prescribed  . COPD (chronic obstructive pulmonary disease) ([place])   . Diabetes mellitus without complication ([place])   . Dyspnea   . GERD (gastroesophageal reflux disease)    occasionally  . History of kidney stones    has one but not a problem  . Hypothyroidism   . Oxygen dependent    2liter nasal prong continuously    SURGICAL HISTORY: Past Surgical History:  Procedure Laterality Date  . APPENDECTOMY    . BACK SURGERY     lumbar. herniated disc . no metal  . BREAST BIOPSY  Right 03/19/2018   INVASIVE MAMMARY CARCINOMA, NO SPECIAL TYPE  . BREAST BIOPSY Left 06/01/2018   MRI bx, grade III invasive ductal carcinoma  . BREAST BIOPSY Right 06/01/2018   MRI bx of enhancing mass, path showed  FOREIGN BODY GRANULOMATOUS RESPONSE   . 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 BREAST LUMPECTOMY Right 07/03/2018   Procedure: RE-EXCISION OF RIGHT BREAST TISSUE MASS;  Surgeon: Benjamine Sprague, DO;  Location: [facility] ORS;  Service: General;  Laterality: Right;  . BREAST LUMPECTOMY Left 06/2018   invasive ductal  . IRRIGATION AND DEBRIDEMENT HEMATOMA Left 07/10/2018   Procedure: IRRIGATION AND DEBRIDEMENT HEMATOMA LEFT AXILLARY;  Surgeon: Benjamine Sprague, DO;  Location: [facility] ORS;  Service: General;  Laterality: Left;  . PARTIAL MASTECTOMY WITH NEEDLE LOCALIZATION AND AXILLARY SENTINEL LYMPH NODE BX Right 04/16/2018   Procedure: PARTIAL MASTECTOMY WITH NEEDLE LOCALIZATION AND AXILLARY SENTINEL LYMPH NODE BX;  Surgeon: Benjamine Sprague, DO;  Location: [facility] ORS;  Service: General;  Laterality: Right;  . PARTIAL MASTECTOMY WITH NEEDLE LOCALIZATION AND AXILLARY SENTINEL LYMPH NODE BX Left 07/03/2018   Procedure: PARTIAL MASTECTOMY WITH NEEDLE LOCALIZATION AND SENTINEL LYMPH NODE BX;  Surgeon: Benjamine Sprague, DO;  Location: [facility] ORS;  Service: General;  Laterality: Left;    SOCIAL HISTORY: Social History   Socioeconomic History  . Marital status: Married    Spouse name: richard..disabled  . Number of children: Not on file  . Years of education: Not on file  . Highest education level: Not on file  Occupational History  . 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 Smoking status: Former Smoker    Types: Cigarettes    Quit date: 03/30/1993    Years since quitting: 26.7  . Smokeless tobacco: Never Used  Substance and Sexual Activity  . Alcohol use: No  . Drug use: Never  . Sexual activity: Not Currently  Other Topics Concern  . Not on file  Social History Narrative  . Not on file   Social Determinants of Health   Financial Resource Strain:   . Difficulty of Paying Living Expenses:   Food Insecurity:   . Worried About Charity fundraiser in the  Last Year:   . Arboriculturist in the Last Year:   Transportation Needs:   . Film/video editor (Medical):   [name] Lack of Transportation (Non-Medical):   Physical Activity:   . Days of Exercise per Week:   . Minutes of Exercise per Session:   Stress:   . Feeling of Stress :   Social Connections:   . Frequency of Communication with Friends and Family:   . Frequency of Social Gatherings with Friends and Family:   . Attends Religious Services:   . Active Member of Clubs or Organizations:   . Attends Archivist Meetings:   [name] Marital Status:   Intimate Partner Violence:   . Fear of Current or Ex-Partner:   . Emotionally Abused:   [name] Physically Abused:   . Sexually Abused:     FAMILY HISTORY: Family History  Problem Relation Age of Onset  . Clotting disorder Mother   . Melanoma Mother        deceased 21  . Lung cancer Sister        deceased 68s; smoker  . Alcohol abuse Father        deceased 49  . Cervical cancer Other  neice  . Breast cancer Neg Hx     ALLERGIES:  is allergic to sulfa antibiotics.  MEDICATIONS:  Current Outpatient Medications  Medication Sig Dispense Refill  . anastrozole (ARIMIDEX) 1 MG tablet Take 1 tablet (1 mg total) by mouth daily. 90 tablet 3  . azelastine (ASTELIN) 0.1 % nasal spray Place 1 spray into both nostrils 2 (two) times daily.     . budesonide-formoterol (SYMBICORT) 160-4.5 MCG/ACT inhaler Inhale 2 puffs into the lungs 2 (two) times daily.     . Calcium Carbonate-Vit D-Min (CALCIUM 1200) 1200-1000 MG-UNIT CHEW Chew 1 tablet by mouth daily. 90 each 1  . D3 50 MCG (2000 UT) TABS TAKE 1 TABLET BY MOUTH EVERY DAY 90 tablet 0  . folic acid (FOLVITE) 1 MG tablet Take 1 mg by mouth daily.    [name] glipiZIDE (GLUCOTROL XL) 5 MG 24 hr tablet Take 1 tablet by mouth daily.    . hydroxychloroquine (PLAQUENIL) 200 MG tablet Take 400 mg by mouth daily.     . Ipratropium-Albuterol (COMBIVENT) 20-100 MCG/ACT AERS respimat Inhale into the  lungs.    . irbesartan (AVAPRO) 75 MG tablet Take 75 mg by mouth daily.    [name] levothyroxine (SYNTHROID, LEVOTHROID) 50 MCG tablet Take 50 mcg by mouth daily before breakfast.    . omeprazole (PRILOSEC) 20 MG capsule Take 20 mg by mouth daily as needed.     [name] losartan (COZAAR) 25 MG tablet Take 1 tablet by mouth daily.     No current facility-administered medications for this visit.     PHYSICAL EXAMINATION: ECOG PERFORMANCE STATUS: 1 - Symptomatic but completely ambulatory Vitals:   12/31/19 1002  BP: (!) 130/56  Pulse: 85  Resp: 18  Temp: (!) 97.2 F (36.2 C)  SpO2: 95%   Filed Weights   12/31/19 1002  Weight: 159 lb 8 oz (72.3 kg)    Physical Exam Constitutional:      General: She is not in acute distress.    Comments: On home oxygen  HENT:     Head: Normocephalic and atraumatic.  Eyes:     General: No scleral icterus.    Conjunctiva/sclera: Conjunctivae normal.     Pupils: Pupils are equal, round, and reactive to light.  Cardiovascular:     Rate and Rhythm: Normal rate and regular rhythm.     Heart sounds: Normal heart sounds.  Pulmonary:     Effort: Pulmonary effort is normal. No respiratory distress.     Comments: Decreased breath sound bilaterally Abdominal:     General: Bowel sounds are normal. There is no distension.     Palpations: Abdomen is soft. There is no mass.     Tenderness: There is no abdominal tenderness.  Musculoskeletal:        General: No deformity. Normal range of motion.     Cervical back: Normal range of motion and neck supple.  Lymphadenopathy:     Cervical: No cervical adenopathy.  Skin:    General: Skin is warm and dry.     Findings: No erythema or rash.  Neurological:     General: No focal deficit present.     Mental Status: She is alert and oriented to person, place, and time.     Cranial Nerves: No cranial nerve deficit.     Coordination: Coordination normal.  Psychiatric:        Mood and Affect: Mood normal.    Breast exam  was performed in seated and lying down position. Patient  is status post bilateral lumpectomy, No palpable right breast mass or palpable axillary lymphadenopathy. Left breast tissue thickening around incision scar, no palpable left axillary    LABORATORY DATA:  I have reviewed the data as listed Lab Results  Component Value Date   WBC 8.5 12/31/2019   HGB 12.1 12/31/2019   HCT 38.5 12/31/2019   MCV 87.3 12/31/2019   PLT 307 12/31/2019   Recent Labs    06/08/19 0924 09/01/19 0839 12/31/19 0919  NA 141 141 141  K 4.1 4.0 3.9  CL 99 99 96*  CO2 34* 34* 35*  GLUCOSE 181* 159* 187*  BUN '15 13 14  '$ CREATININE 0.91 0.91 0.83  CALCIUM 9.1 9.3 9.3  GFRNONAA >60 >60 >60  GFRAA >60 >60 >60  PROT 7.4 7.3 7.3  ALBUMIN 4.3 4.3 4.1  AST '18 19 17  '$ ALT '15 15 13  '$ ALKPHOS 53 45 53  BILITOT 0.5 0.6 0.5   Iron/TIBC/Ferritin/ %Sat No results found for: IRON, TIBC, FERRITIN, IRONPCTSAT      ASSESSMENT & PLAN:  1. Invasive carcinoma of breast ([place])   2. Aromatase inhibitor use    #Right invasive carcinoma of breast and the left DCIS-mammographic occult malignancy Patient is clinically doing well.  She tolerates Arimidex 1 mg daily.  Continue. Labs were reviewed and discussed with patient. Patient has been on MRI alternating with diagnostic mammogram for surveillance due to the initial mammographic occult malignancy.   Last MRI was in October 2020. Recommend patient to proceed with her annual diagnostic mammogram in August 2021.  #Osteopenia -continue calcium and vitamin D supplementation.  Vitamin D deficiency, vitamin D level has improved.  Discussed at length the risk factors [race, post-menopausal status, use of AI]; prevention/treatment strategies-  including but not limited to exercise calcium and vitamin D daily.  Discussed the potential side effects of bisphosphonate including but not limited to- hypocalcemia and ONJ; discussed not to have invasive dental procedures few weeks  around the time of the infusions. Recommend patient to obtain dental clearance.  Patient wanted further deferred due to pandemic .   Orders Placed This Encounter  Procedures  . US Breast Limited Uni Left Inc Axilla    Standing Status:   Future    Standing Expiration Date:   03/01/2021    Order Specific Question:   Reason for Exam (SYMPTOM  OR DIAGNOSIS REQUIRED)    Answer:   Aromatase inhibitor use, history of breast cancer    Order Specific Question:   Preferred imaging location?    Answer:   [facility]  . US Breast Limited Uni Right Inc Axilla    Standing Status:   Future    Standing Expiration Date:   03/01/2021    Order Specific Question:   Reason for Exam (SYMPTOM  OR DIAGNOSIS REQUIRED)    Answer:   Aromatase inhibitor use, history of breast cancer    Order Specific Question:   Preferred imaging location?    Answer:   [facility]  . MM DIAG BREAST TOMO BILATERAL    Standing Status:   Future    Standing Expiration Date:   12/30/2020    Order Specific Question:   Reason for Exam (SYMPTOM  OR DIAGNOSIS REQUIRED)    Answer:   Aromatase inhibitor use, history of breast cancer    Order Specific Question:   Preferred imaging location?    Answer:   [facility]  . CBC with Differential/Platelet    Standing Status:   Future  Standing Expiration Date:   12/30/2020  . Comprehensive metabolic panel    Standing Status:   Future    Standing Expiration Date:   12/30/2020    All questions were answered. The patient knows to call the clinic with any problems questions or concerns.  Return of visit: 4 months.  Earlie Server, MD, PhD 01/01/2020

## 2020-01-02 DIAGNOSIS — J449 Chronic obstructive pulmonary disease, unspecified: Secondary | ICD-10-CM | POA: Diagnosis not present

## 2020-01-31 DIAGNOSIS — J449 Chronic obstructive pulmonary disease, unspecified: Secondary | ICD-10-CM | POA: Diagnosis not present

## 2020-01-31 DIAGNOSIS — M79644 Pain in right finger(s): Secondary | ICD-10-CM | POA: Diagnosis not present

## 2020-01-31 DIAGNOSIS — M06322 Rheumatoid nodule, left elbow: Secondary | ICD-10-CM | POA: Diagnosis not present

## 2020-01-31 DIAGNOSIS — C50512 Malignant neoplasm of lower-outer quadrant of left female breast: Secondary | ICD-10-CM | POA: Diagnosis not present

## 2020-01-31 DIAGNOSIS — M0579 Rheumatoid arthritis with rheumatoid factor of multiple sites without organ or systems involvement: Secondary | ICD-10-CM | POA: Diagnosis not present

## 2020-02-01 DIAGNOSIS — J449 Chronic obstructive pulmonary disease, unspecified: Secondary | ICD-10-CM | POA: Diagnosis not present

## 2020-02-03 DIAGNOSIS — Z79899 Other long term (current) drug therapy: Secondary | ICD-10-CM | POA: Diagnosis not present

## 2020-02-03 DIAGNOSIS — M0579 Rheumatoid arthritis with rheumatoid factor of multiple sites without organ or systems involvement: Secondary | ICD-10-CM | POA: Diagnosis not present

## 2020-02-03 DIAGNOSIS — J449 Chronic obstructive pulmonary disease, unspecified: Secondary | ICD-10-CM | POA: Diagnosis not present

## 2020-02-03 DIAGNOSIS — M79644 Pain in right finger(s): Secondary | ICD-10-CM | POA: Diagnosis not present

## 2020-02-11 DIAGNOSIS — M79644 Pain in right finger(s): Secondary | ICD-10-CM | POA: Diagnosis not present

## 2020-02-11 DIAGNOSIS — E034 Atrophy of thyroid (acquired): Secondary | ICD-10-CM | POA: Diagnosis not present

## 2020-02-11 DIAGNOSIS — M0579 Rheumatoid arthritis with rheumatoid factor of multiple sites without organ or systems involvement: Secondary | ICD-10-CM | POA: Diagnosis not present

## 2020-02-11 DIAGNOSIS — E119 Type 2 diabetes mellitus without complications: Secondary | ICD-10-CM | POA: Diagnosis not present

## 2020-02-11 DIAGNOSIS — C50512 Malignant neoplasm of lower-outer quadrant of left female breast: Secondary | ICD-10-CM | POA: Diagnosis not present

## 2020-02-11 DIAGNOSIS — J449 Chronic obstructive pulmonary disease, unspecified: Secondary | ICD-10-CM | POA: Diagnosis not present

## 2020-02-11 DIAGNOSIS — M06322 Rheumatoid nodule, left elbow: Secondary | ICD-10-CM | POA: Diagnosis not present

## 2020-02-16 DIAGNOSIS — J449 Chronic obstructive pulmonary disease, unspecified: Secondary | ICD-10-CM | POA: Diagnosis not present

## 2020-02-16 DIAGNOSIS — G2581 Restless legs syndrome: Secondary | ICD-10-CM | POA: Diagnosis not present

## 2020-02-16 DIAGNOSIS — E119 Type 2 diabetes mellitus without complications: Secondary | ICD-10-CM | POA: Diagnosis not present

## 2020-02-16 DIAGNOSIS — C50411 Malignant neoplasm of upper-outer quadrant of right female breast: Secondary | ICD-10-CM | POA: Diagnosis not present

## 2020-02-16 DIAGNOSIS — I10 Essential (primary) hypertension: Secondary | ICD-10-CM | POA: Diagnosis not present

## 2020-02-16 DIAGNOSIS — J9611 Chronic respiratory failure with hypoxia: Secondary | ICD-10-CM | POA: Diagnosis not present

## 2020-02-16 DIAGNOSIS — Z170 Estrogen receptor positive status [ER+]: Secondary | ICD-10-CM | POA: Diagnosis not present

## 2020-02-16 DIAGNOSIS — E7800 Pure hypercholesterolemia, unspecified: Secondary | ICD-10-CM | POA: Diagnosis not present

## 2020-03-03 DIAGNOSIS — J449 Chronic obstructive pulmonary disease, unspecified: Secondary | ICD-10-CM | POA: Diagnosis not present

## 2020-03-16 DIAGNOSIS — J449 Chronic obstructive pulmonary disease, unspecified: Secondary | ICD-10-CM | POA: Diagnosis not present

## 2020-03-16 DIAGNOSIS — Z79899 Other long term (current) drug therapy: Secondary | ICD-10-CM | POA: Diagnosis not present

## 2020-03-16 DIAGNOSIS — M0579 Rheumatoid arthritis with rheumatoid factor of multiple sites without organ or systems involvement: Secondary | ICD-10-CM | POA: Diagnosis not present

## 2020-03-16 DIAGNOSIS — M79644 Pain in right finger(s): Secondary | ICD-10-CM | POA: Diagnosis not present

## 2020-04-02 DIAGNOSIS — J449 Chronic obstructive pulmonary disease, unspecified: Secondary | ICD-10-CM | POA: Diagnosis not present

## 2020-04-03 ENCOUNTER — Other Ambulatory Visit: Payer: Self-pay | Admitting: Oncology

## 2020-04-17 ENCOUNTER — Ambulatory Visit
Admission: RE | Admit: 2020-04-17 | Discharge: 2020-04-17 | Disposition: A | Discharge status: Home / Self Care | Payer: PPO | Source: Ambulatory Visit | Attending: Oncology | Admitting: Oncology

## 2020-04-17 ENCOUNTER — Other Ambulatory Visit: Payer: Self-pay

## 2020-04-17 DIAGNOSIS — C50919 Malignant neoplasm of unspecified site of unspecified female breast: Secondary | ICD-10-CM

## 2020-04-17 DIAGNOSIS — Z853 Personal history of malignant neoplasm of breast: Secondary | ICD-10-CM | POA: Insufficient documentation

## 2020-04-17 DIAGNOSIS — Z79811 Long term (current) use of aromatase inhibitors: Secondary | ICD-10-CM | POA: Insufficient documentation

## 2020-04-20 ENCOUNTER — Inpatient Hospital Stay (HOSPITAL_BASED_OUTPATIENT_CLINIC_OR_DEPARTMENT_OTHER): Payer: PPO | Admitting: Oncology

## 2020-04-20 ENCOUNTER — Other Ambulatory Visit: Payer: Self-pay

## 2020-04-20 ENCOUNTER — Encounter: Payer: Self-pay | Admitting: Oncology

## 2020-04-20 ENCOUNTER — Inpatient Hospital Stay: Payer: PPO | Attending: Oncology

## 2020-04-20 VITALS — BP 129/59 | HR 81 | Temp 98.2°F | Resp 18 | Wt 151.8 lb

## 2020-04-20 DIAGNOSIS — C50919 Malignant neoplasm of unspecified site of unspecified female breast: Secondary | ICD-10-CM

## 2020-04-20 DIAGNOSIS — Z79811 Long term (current) use of aromatase inhibitors: Secondary | ICD-10-CM | POA: Insufficient documentation

## 2020-04-20 DIAGNOSIS — C50911 Malignant neoplasm of unspecified site of right female breast: Secondary | ICD-10-CM | POA: Insufficient documentation

## 2020-04-20 DIAGNOSIS — D171 Benign lipomatous neoplasm of skin and subcutaneous tissue of trunk: Secondary | ICD-10-CM

## 2020-04-20 DIAGNOSIS — Z87891 Personal history of nicotine dependence: Secondary | ICD-10-CM | POA: Insufficient documentation

## 2020-04-20 DIAGNOSIS — M8580 Other specified disorders of bone density and structure, unspecified site: Secondary | ICD-10-CM | POA: Insufficient documentation

## 2020-04-20 DIAGNOSIS — Z79899 Other long term (current) drug therapy: Secondary | ICD-10-CM | POA: Insufficient documentation

## 2020-04-20 DIAGNOSIS — Z9981 Dependence on supplemental oxygen: Secondary | ICD-10-CM | POA: Diagnosis not present

## 2020-04-20 DIAGNOSIS — Z803 Family history of malignant neoplasm of breast: Secondary | ICD-10-CM | POA: Insufficient documentation

## 2020-04-20 DIAGNOSIS — M069 Rheumatoid arthritis, unspecified: Secondary | ICD-10-CM | POA: Insufficient documentation

## 2020-04-20 DIAGNOSIS — Z170 Estrogen receptor positive status [ER+]: Secondary | ICD-10-CM | POA: Insufficient documentation

## 2020-04-20 LAB — CBC WITH DIFFERENTIAL/PLATELET
Abs Immature Granulocytes: 0.04 10*3/uL (ref 0.00–0.07)
Basophils Absolute: 0.1 10*3/uL (ref 0.0–0.1)
Basophils Relative: 1 %
Eosinophils Absolute: 0.3 10*3/uL (ref 0.0–0.5)
Eosinophils Relative: 4 %
HCT: 41.1 % (ref 36.0–46.0)
Hemoglobin: 13 g/dL (ref 12.0–15.0)
Immature Granulocytes: 0 %
Lymphocytes Relative: 20 %
Lymphs Abs: 1.8 10*3/uL (ref 0.7–4.0)
MCH: 27.5 pg (ref 26.0–34.0)
MCHC: 31.6 g/dL (ref 30.0–36.0)
MCV: 87.1 fL (ref 80.0–100.0)
Monocytes Absolute: 0.7 10*3/uL (ref 0.1–1.0)
Monocytes Relative: 8 %
Neutro Abs: 6.2 10*3/uL (ref 1.7–7.7)
Neutrophils Relative %: 67 %
Platelets: 270 10*3/uL (ref 150–400)
RBC: 4.72 MIL/uL (ref 3.87–5.11)
RDW: 13.5 % (ref 11.5–15.5)
WBC: 9.2 10*3/uL (ref 4.0–10.5)
nRBC: 0 % (ref 0.0–0.2)

## 2020-04-20 LAB — COMPREHENSIVE METABOLIC PANEL
ALT: 13 U/L (ref 0–44)
AST: 17 U/L (ref 15–41)
Albumin: 4.3 g/dL (ref 3.5–5.0)
Alkaline Phosphatase: 48 U/L (ref 38–126)
Anion gap: 7 (ref 5–15)
BUN: 15 mg/dL (ref 8–23)
CO2: 37 mmol/L — ABNORMAL HIGH (ref 22–32)
Calcium: 9.4 mg/dL (ref 8.9–10.3)
Chloride: 97 mmol/L — ABNORMAL LOW (ref 98–111)
Creatinine, Ser: 0.76 mg/dL (ref 0.44–1.00)
GFR calc Af Amer: >60 mL/min (ref >60)
GFR calc non Af Amer: >60 mL/min (ref >60)
Glucose, Bld: 79 mg/dL (ref 70–99)
Potassium: 4.2 mmol/L (ref 3.5–5.1)
Sodium: 141 mmol/L (ref 135–145)
Total Bilirubin: 0.5 mg/dL (ref 0.3–1.2)
Total Protein: 7.4 g/dL (ref 6.5–8.1)

## 2020-04-20 NOTE — Progress Notes (Signed)
Pt here for follow up. No new concerns voiced. No new breast concerns voiced.

## 2020-04-20 NOTE — Progress Notes (Signed)
Hematology/Oncology follow up  note [facility] [phone] Fax:([phone]) [phone]   Patient Care Team: Baxter Hire, MD as PCP - General (Internal Medicine) Breast Surgeon Dr.Sakai.   CHIEF COMPLAINTS/REASON FOR VISIT:  Follow up  of breast cancer  HISTORY OF PRESENTING ILLNESS:  Amy Woodard is a  79 y.o.  female with PMH listed below who was referred to me for evaluation of newly diagnosed breast cancer. Patient had mammogram and ultrasound on 03/19/2018 which showed right breast 12:00 1.6 x 1.3 x 1.5 cm mass, no radiographically enlarged adenopathy.  Patient also report feeling a mass for the past couple of months. Biopsy pathology showed: Invasive mammary carcinoma, no special type, grade 3, DCIS present, lymphovascular invasion not identified, ER PR 90% positive, HER-2 negative  Nipple discharge: Denies Family history: Mother had melanoma, passed away at age of 15, sister had cervical cancer.  Sister with colon cancer, maternal grandmother sister OCP use: denies.  Estrogen and progesterone therapy: denies History of radiation to chest: denies.  Previous breast surgery: Denies  She has a complicated course of rest cancer diagnosis and surgery. # 04/16/2018 status post right breast mass lumpectomy and sentinel lymph node biopsy.  Pathology showed invasive mammary carcinoma 1.7 cm, DCIS, high-grade with extensive involvement of lobules.  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  Sentinel lymph node biopsy negative. pT1c pN0. ER >90%, PR >90%, HER 2 negative.   05/14/2018 patient underwent MRI breast bilaterally to determined extent of positive deep margin/lobular type breast cancer. MRI breast showed an irregular 1.4 cm mass in the lower inner quadrant of the left breast. Abnormal non-mass enhancement in the upper outer right breast, axillary tail region could be secondary to postsurgical changes, malignancy  cannot be excluded. 06/05/2018 left breast mass biopsy showed invasive ductal carcinoma, grade 3, [facility] ER 100%, PR 50%, not enough tissue for HER 2 analysis.  07/03/2018 patient underwent right breast deep margin lumpectomy/reexcision: Pathology showed negative for residual malignancy. Left breast needle localized lumpectomy showed negative for residual malignancy.  Sentinel lymph node negative In addition the outer slides on this patient's left breast biopsy were requested and reviewed by Dr. RReuel Derby  Dr. RReuel Derbyfeels the patient's biopsy showed high-grade DCIS with focal areas suspicious for invasion.  Definitive invasive carcinoma is not identified.  DCIS is present in both outside blocks and slides from biopsy and measures 7 mm and 6 mm respectively.  pTis pN0 Patient case have been discussed on breast tumor board multiple times with reviewing of images and pathology findings. Left breast MRI did show a 1.4 cm irregularity, and pathology only 7 mm and a 6 mm DCIS were found at the initial left breast biopsy.  Final left breast lumpectomy did not contain clips which was placed with MRI guidance at biopsy.. Per my discussion with surgeon Dr.Sakai, even clips are found on repeat image, re-excision is not feasible and the next step will be mastectomy.   # 07/03/2018-07/06/2018 admitted for pain control, sentinel lymph node biopsy site hematoma,  #07/10/2018 - 07/14/2018 Patient admitted due to acute on chronic anemia due to hematoma.  She was admitted for evacuation of postoperative left axillary sentinel lymph node biopsy site hematoma.  Status post PRBC transfusion during her admission. # 08/14/2018-08/17/2018 readmitted due to breast cellulitis despite outpatient drainage and oral antibiotics.  # 08/24/2018- 08/28/2018 readmitted due to recurrent right breast abscess.  She was placed on IV antibiotics with Ceftriaxone via picc line.  #A  repeat US has been done on 09/07/2018 and it shows near resolution of the fluid. # #Finished IV Ceftriaxone on 09/11/2018.   Patient has a history of chronic respiratory failure/ COPD, on home oxygen.  # Right breast pT1c pN0, reexcision did not reveal any residual disease.. Oncotype DX showed recurrence rate of 31, patient has 15% benefit from adjuvant chemotherapy.  Decision was made not to proceed with adjuvant chemotherapy due to significant delays secondary to surgical complication, slow recovery from surgery, borderline performance status and comorbidities.  Patient declined radiation due to severe lung condition and is afraid of lung toxicities.[name]  #Left breast high-grade DCIS She has been tolerating antiestrogen treatment with Arimidex very well.  Manageable side effects. left breast biopsy clip was not found in the specimen. Continue Arimidex, tolerates well.   #Previously decided to omit chemotherapy due to severe delay secondary to complicated post surgery course, slow recovery from surgery, borderline performance status, comorbidities including chronic respiratory failure. 10/27/2018 left unilateral diagnostic mammogram showed left breast lower inner quadrant still presents results was discussed with patient and patient's surgeon Dr. Lysle Pearl.  Dr. Lysle Pearl feels the only option will be mastectomy and the patient is not interested.  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 Recommend patient to follow-up with primary care provider.  If it continues to bother her she needs to see a surgery for evaluation and resection.  #Started on ArimideX1/05/2019 06/21/2019 patient had bilateral breast MRI which showed no MRI evidence of malignancy.  INTERVAL HISTORY Amy Woodard is a 79 y.o. female who has above history reviewed by me today presents for follow up visit for management of breast cancer. Patient reports feeling well at baseline. She has had mammogram  done recently. She has no new complaints. She takes Arimidex daily and tolerates well. Chronic shortness of breath due to respiratory failure, she is on home oxygen. She feels that her soft tissue nodule on the left posterior upper back has getting bigger    Review of Systems  Constitutional: Negative for chills, fever, malaise/fatigue and weight loss.  HENT: Negative for nosebleeds and sore throat.   Eyes: Negative for double vision, photophobia and redness.  Respiratory: Positive for shortness of breath. Negative for cough and wheezing.   Cardiovascular: Negative for chest pain, palpitations, orthopnea and leg swelling.  Gastrointestinal: Negative for abdominal pain, blood in stool, nausea and vomiting.  Genitourinary: Negative for dysuria.  Musculoskeletal: Negative for myalgias.  Skin: Negative for itching and rash.  Neurological: Negative for dizziness, tingling and tremors.  Endo/Heme/Allergies: Negative for environmental allergies. Does not bruise/bleed easily.  Psychiatric/Behavioral: Negative for depression and hallucinations.    MEDICAL HISTORY:  Past Medical History:  Diagnosis Date  . Allergy   . Arthritis    rheumatoid arthritis  . Breast cancer ([place]) 05/2018   Right breast found 1st, then left breast with ductal ca  . Cellulitis of breast 05/2018   right breast cellulitis after first biopsy,  antibiotics prescribed  . COPD (chronic obstructive pulmonary disease) ([place])   . Diabetes mellitus without complication ([place])   . Dyspnea   . GERD (gastroesophageal reflux disease)    occasionally  . History of kidney stones    has one but not a problem  . Hypothyroidism   . Oxygen dependent    2liter nasal prong continuously    SURGICAL HISTORY: Past Surgical History:  Procedure Laterality Date  . APPENDECTOMY    . BACK SURGERY     lumbar. herniated disc . no metal  . BREAST BIOPSY Right 03/19/2018   INVASIVE MAMMARY CARCINOMA, NO SPECIAL TYPE  . BREAST BIOPSY  Left 06/01/2018   MRI bx, grade III invasive ductal carcinoma  . BREAST BIOPSY Right 06/01/2018   MRI bx of enhancing mass, path showed FOREIGN BODY GRANULOMATOUS RESPONSE   . 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 BREAST LUMPECTOMY Right 07/03/2018   Procedure: RE-EXCISION OF RIGHT BREAST TISSUE MASS;  Surgeon: Benjamine Sprague, DO;  Location: [facility] ORS;  Service: General;  Laterality: Right;  . BREAST LUMPECTOMY Left 06/2018   invasive ductal  . IRRIGATION AND DEBRIDEMENT HEMATOMA Left 07/10/2018   Procedure: IRRIGATION AND DEBRIDEMENT HEMATOMA LEFT AXILLARY;  Surgeon: Benjamine Sprague, DO;  Location: [facility] ORS;  Service: General;  Laterality: Left;  . PARTIAL MASTECTOMY WITH NEEDLE LOCALIZATION AND AXILLARY SENTINEL LYMPH NODE BX Right 04/16/2018   Procedure: PARTIAL MASTECTOMY WITH NEEDLE LOCALIZATION AND AXILLARY SENTINEL LYMPH NODE BX;  Surgeon: Benjamine Sprague, DO;  Location: [facility] ORS;  Service: General;  Laterality: Right;  . PARTIAL MASTECTOMY WITH NEEDLE LOCALIZATION AND AXILLARY SENTINEL LYMPH NODE BX Left 07/03/2018   Procedure: PARTIAL MASTECTOMY WITH NEEDLE LOCALIZATION AND SENTINEL LYMPH NODE BX;  Surgeon: Benjamine Sprague, DO;  Location: [facility] ORS;  Service: General;  Laterality: Left;    SOCIAL HISTORY: Social History   Socioeconomic History  . Marital status: Married    Spouse name: richard..disabled  . Number of children: Not on file  . Years of education: Not on file  . Highest education level: Not on file  Occupational History  . 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 Smoking status: Former Smoker    Types: Cigarettes    Quit date: 03/30/1993    Years since quitting: 27.0  . Smokeless tobacco: Never Used  Vaping Use  . Vaping Use: Never used  Substance and Sexual Activity  . Alcohol use: No  . Drug use: Never  . Sexual activity: Not Currently  Other Topics Concern  . Not on file  Social History Narrative  . Not on  file   Social Determinants of Health   Financial Resource Strain:   . Difficulty of Paying Living Expenses:   Food Insecurity:   . Worried About Charity fundraiser in the Last Year:   . Arboriculturist in the Last Year:   Transportation Needs:   . Film/video editor (Medical):   [name] Lack of Transportation (Non-Medical):   Physical Activity:   . Days of Exercise per Week:   . Minutes of Exercise per Session:   Stress:   . Feeling of Stress :   Social Connections:   . Frequency of Communication with Friends and Family:   . Frequency of Social Gatherings with Friends and Family:   . Attends Religious Services:   . Active Member of Clubs or Organizations:   . Attends Archivist Meetings:   [name] Marital Status:   Intimate Partner Violence:   . Fear of Current or Ex-Partner:   . Emotionally Abused:   [name] Physically Abused:   . Sexually Abused:     FAMILY HISTORY: Family History  Problem Relation Age of Onset  . Clotting disorder Mother   . Melanoma Mother        deceased 28  . Lung cancer  Sister        deceased 109s; smoker  . Alcohol abuse Father        deceased 84  . Cervical cancer Other        neice  . Breast cancer Neg Hx     ALLERGIES:  is allergic to sulfa antibiotics.  MEDICATIONS:  Current Outpatient Medications  Medication Sig Dispense Refill  . anastrozole (ARIMIDEX) 1 MG tablet Take 1 tablet (1 mg total) by mouth daily. 90 tablet 3  . azelastine (ASTELIN) 0.1 % nasal spray Place 1 spray into both nostrils 2 (two) times daily.     . Calcium Carbonate-Vit D-Min (CALCIUM 1200) 1200-1000 MG-UNIT CHEW Chew 1 tablet by mouth daily. 90 each 1  . [employer] D3 50 MCG (2000 UT) CAPS TAKE 1 CAPSULE BY MOUTH EVERY DAY 90 capsule 1  . fluticasone (FLONASE) 50 MCG/ACT nasal spray SPRAY 2 SPRAYS INTO EACH NOSTRIL EVERY DAY    . folic acid (FOLVITE) 1 MG tablet Take 1 mg by mouth daily.    [name] glipiZIDE (GLUCOTROL XL) 5 MG 24 hr tablet Take 1 tablet by mouth daily.    .  hydroxychloroquine (PLAQUENIL) 200 MG tablet Take 400 mg by mouth daily.     . Ipratropium-Albuterol (COMBIVENT) 20-100 MCG/ACT AERS respimat Inhale into the lungs.    . irbesartan (AVAPRO) 75 MG tablet Take 75 mg by mouth daily.    [name] levothyroxine (SYNTHROID, LEVOTHROID) 50 MCG tablet Take 50 mcg by mouth daily before breakfast.    . losartan (COZAAR) 25 MG tablet Take 1 tablet by mouth daily.    . methotrexate (RHEUMATREX) 2.5 MG tablet Take by mouth. Take 4 tablets (10 mg total) by mouth every 7 (seven) days    . omeprazole (PRILOSEC) 20 MG capsule Take 20 mg by mouth daily as needed.     . budesonide-formoterol (SYMBICORT) 160-4.5 MCG/ACT inhaler Inhale 2 puffs into the lungs 2 (two) times daily.      No current facility-administered medications for this visit.     PHYSICAL EXAMINATION: ECOG PERFORMANCE STATUS: 1 - Symptomatic but completely ambulatory Vitals:   04/20/20 1052  BP: (!) 129/59  Pulse: 81  Resp: 18  Temp: 98.2 F (36.8 C)  SpO2: 92%   Filed Weights   04/20/20 1052  Weight: 151 lb 12.8 oz (68.9 kg)    Physical Exam Constitutional:      General: She is not in acute distress.    Comments: On home oxygen  HENT:     Head: Normocephalic and atraumatic.  Eyes:     General: No scleral icterus.    Conjunctiva/sclera: Conjunctivae normal.     Pupils: Pupils are equal, round, and reactive to light.  Cardiovascular:     Rate and Rhythm: Normal rate and regular rhythm.     Heart sounds: Normal heart sounds.  Pulmonary:     Effort: Pulmonary effort is normal. No respiratory distress.     Comments: Decreased breath sound bilaterally Abdominal:     General: Bowel sounds are normal. There is no distension.     Palpations: Abdomen is soft. There is no mass.     Tenderness: There is no abdominal tenderness.  Musculoskeletal:        General: No deformity. Normal range of motion.     Cervical back: Normal range of motion and neck supple.  Lymphadenopathy:      Cervical: No cervical adenopathy.  Skin:    General: Skin is warm and dry.  Findings: No erythema or rash.     Comments: Left side posterior upper chest wall soft tissue nodule previously worked up which is likely a lipoma  Neurological:     General: No focal deficit present.     Mental Status: She is alert and oriented to person, place, and time.     Cranial Nerves: No cranial nerve deficit.     Coordination: Coordination normal.  Psychiatric:        Mood and Affect: Mood normal.    Breast exam was performed in seated  position. Patient is status post bilateral lumpectomy, No palpable right breast mass or palpable axillary lymphadenopathy. Left breast tissue thickening around incision scar, no palpable left axillary    LABORATORY DATA:  I have reviewed the data as listed Lab Results  Component Value Date   WBC 9.2 04/20/2020   HGB 13.0 04/20/2020   HCT 41.1 04/20/2020   MCV 87.1 04/20/2020   PLT 270 04/20/2020   Recent Labs    09/01/19 0839 12/31/19 0919 04/20/20 0950  NA 141 141 141  K 4.0 3.9 4.2  CL 99 96* 97*  CO2 34* 35* 37*  GLUCOSE 159* 187* 79  BUN _0 CREATININE 0.91 0.83 0.76  CALCIUM 9.3 9.3 9.4  GFRNONAA >60 >60 >60  GFRAA >60 >60 >60  PROT 7.3 7.3 7.4  ALBUMIN 4.3 4.1 4.3  AST _1 ALT _2 ALKPHOS 45 53 48  BILITOT 0.6 0.5 0.5   Iron/TIBC/Ferritin/ %Sat No results found for: IRON, TIBC, FERRITIN, IRONPCTSAT      ASSESSMENT & PLAN:  1. Invasive carcinoma of breast ([place])   2. Aromatase inhibitor use   3. Osteopenia, unspecified location   4. Lipoma of torso    #Right invasive carcinoma of breast and the left DCIS-mammographic occult malignancy Continue Arimidex 1 mg daily. Mammogram was independently reviewed by me and discussed with patient. No mammographic evidence of disease recurrence.  Proceed with MRI in 6 months. Continue MRI alternating with diagnostic mammogram for surveillance due to the initial  mammographic occult malignancy.  #Osteopenia -continue calcium and vitamin D supplementation.  Previously discussed about bisphosphonate. Need dental clearance_3 patient wants to defer due to pandemic's.[name]  #Lipoma, previously ultrasound showed subcutaneous lesion measuring 4.7 x 1 x 5.1 cm most consistent with a benign lipoma. I recommend patient to see surgery for discussion of resection. Orders Placed This Encounter  Procedures  . MR BREAST BILATERAL W WO CONTRAST INC CAD    Standing Status:   Future    Standing Expiration Date:   04/20/2021    Order Specific Question:   If indicated for the ordered procedure, I authorize the administration of contrast media per Radiology protocol    Answer:   Yes    Order Specific Question:   What is the patient's sedation requirement?    Answer:   No Sedation    Order Specific Question:   Does the patient have a pacemaker or implanted devices?    Answer:   No    Order Specific Question:   Radiology Contrast Protocol - do NOT remove file path    Answer:   _4 [email or web address]    Order Specific Question:   Preferred imaging location?    Answer:   [facility] (table limit - 550lbs)  . CBC with Differential/Platelet    Standing Status:   Future    Standing Expiration Date:   04/20/2021  . Comprehensive metabolic panel  Standing Status:   Future    Standing Expiration Date:   04/20/2021    All questions were answered. The patient knows to call the clinic with any problems questions or concerns.  Return of visit: 6 months.  Earlie Server, MD, PhD 04/20/2020

## 2020-04-25 DIAGNOSIS — R0600 Dyspnea, unspecified: Secondary | ICD-10-CM | POA: Diagnosis not present

## 2020-04-25 DIAGNOSIS — J310 Chronic rhinitis: Secondary | ICD-10-CM | POA: Diagnosis not present

## 2020-04-25 DIAGNOSIS — Z01818 Encounter for other preprocedural examination: Secondary | ICD-10-CM | POA: Diagnosis not present

## 2020-04-25 DIAGNOSIS — J439 Emphysema, unspecified: Secondary | ICD-10-CM | POA: Diagnosis not present

## 2020-04-25 DIAGNOSIS — J449 Chronic obstructive pulmonary disease, unspecified: Secondary | ICD-10-CM | POA: Diagnosis not present

## 2020-05-03 DIAGNOSIS — J449 Chronic obstructive pulmonary disease, unspecified: Secondary | ICD-10-CM | POA: Diagnosis not present

## 2020-05-16 DIAGNOSIS — C50512 Malignant neoplasm of lower-outer quadrant of left female breast: Secondary | ICD-10-CM | POA: Diagnosis not present

## 2020-05-16 DIAGNOSIS — M0579 Rheumatoid arthritis with rheumatoid factor of multiple sites without organ or systems involvement: Secondary | ICD-10-CM | POA: Diagnosis not present

## 2020-05-16 DIAGNOSIS — Z79899 Other long term (current) drug therapy: Secondary | ICD-10-CM | POA: Diagnosis not present

## 2020-05-16 DIAGNOSIS — J449 Chronic obstructive pulmonary disease, unspecified: Secondary | ICD-10-CM | POA: Diagnosis not present

## 2020-05-16 DIAGNOSIS — M79644 Pain in right finger(s): Secondary | ICD-10-CM | POA: Diagnosis not present

## 2020-06-03 DIAGNOSIS — J449 Chronic obstructive pulmonary disease, unspecified: Secondary | ICD-10-CM | POA: Diagnosis not present

## 2020-06-21 ENCOUNTER — Other Ambulatory Visit: Payer: Self-pay | Admitting: Oncology

## 2020-06-26 IMAGING — MG MM CLIP PLACEMENT
2 series · 2 of 2 positions shown · non-contrast
Comparison: Previous exam(s).

CLINICAL DATA: Bilateral breast MRI biopsies were performed today.

EXAM:
DIAGNOSTIC BILATERAL MAMMOGRAM POST MRI BIOPSIES

[R CC]
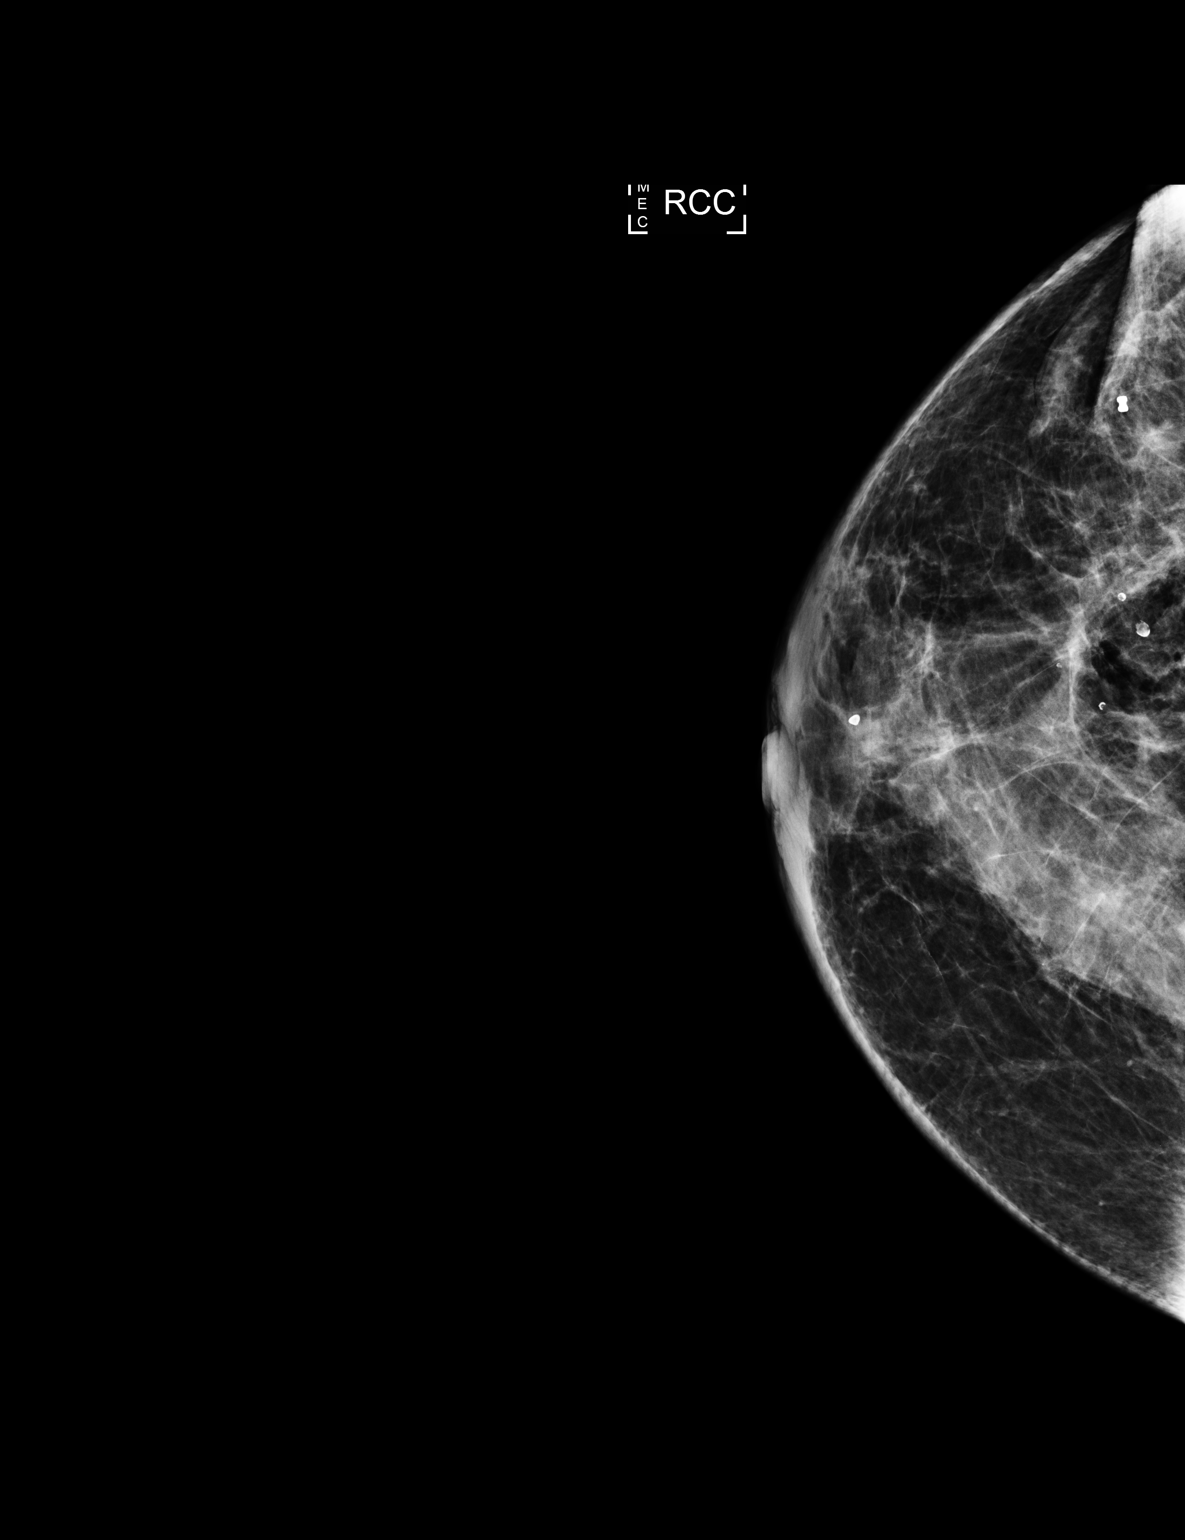

[R ML]
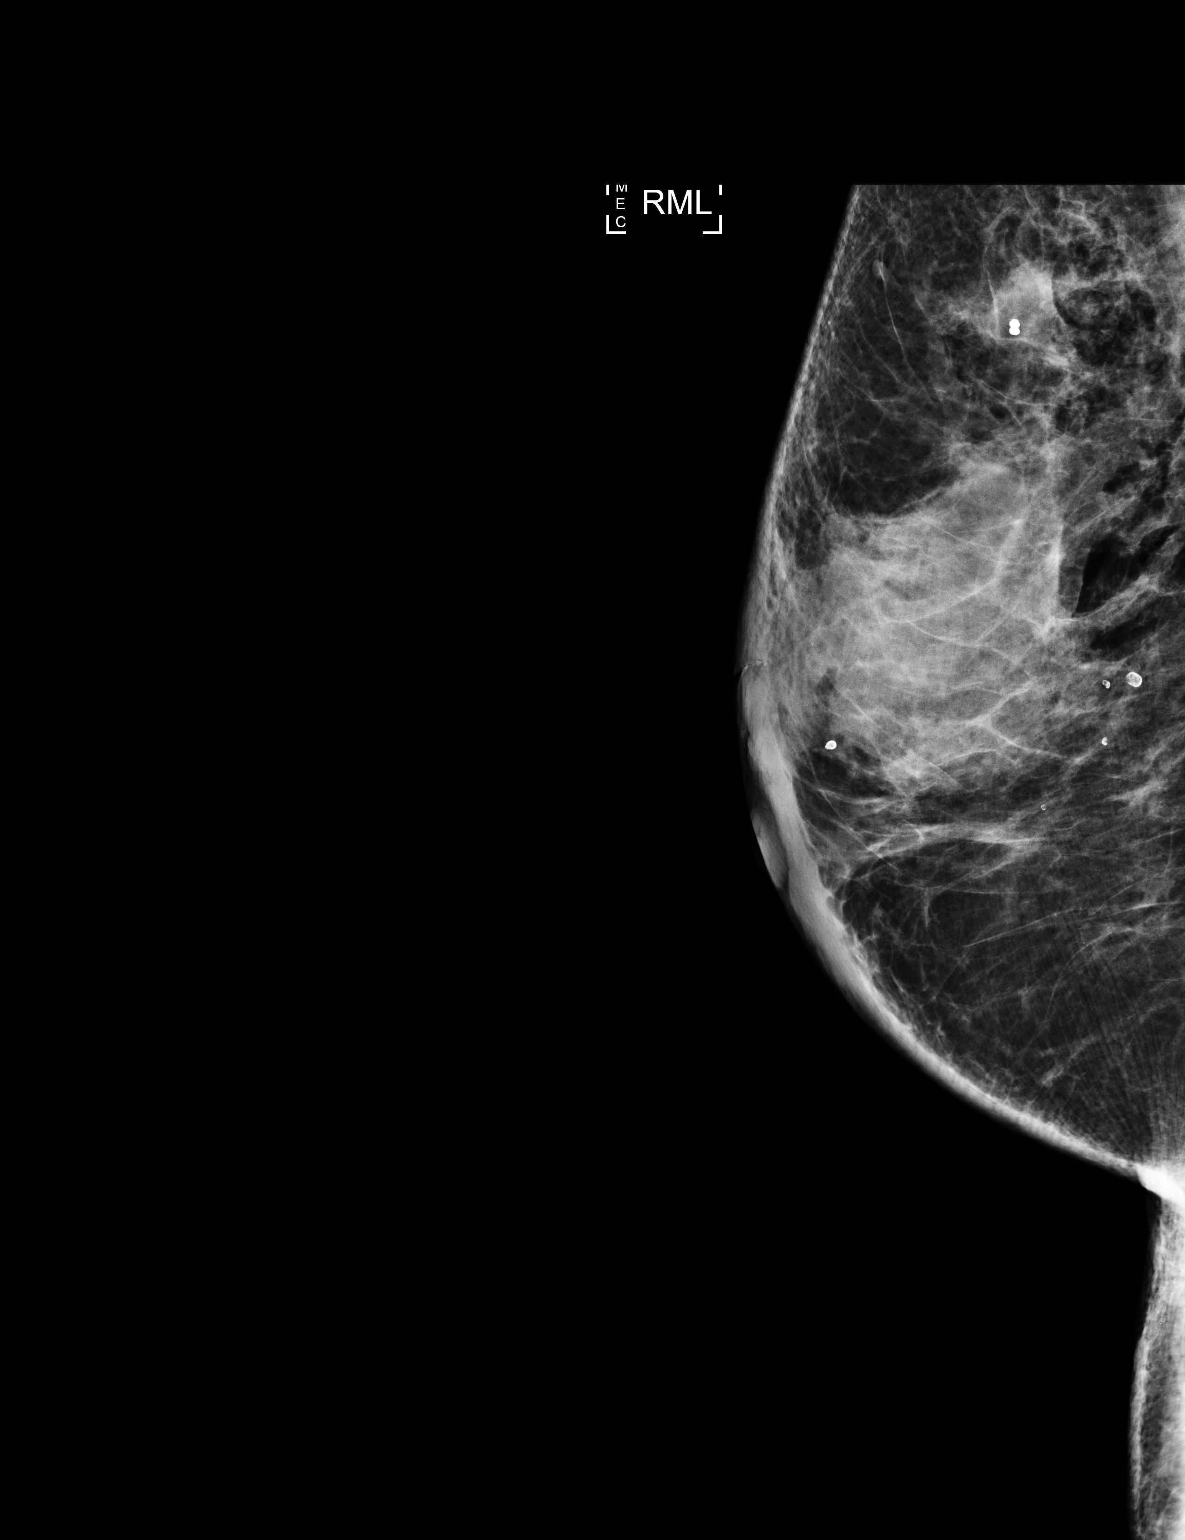

[2 of 2 positions shown; findings below may reference images not displayed]

FINDINGS: Mammographic images were obtained following MRI guided biopsy of
bilateral breasts. In the right breast, a dumbbell-shaped biopsy
clip is satisfactorily positioned in the axillary tail.

In the left breast, a dumbbell-shaped biopsy clip is seen in the far
posterior lower inner quadrant, in satisfactory position. In the ML
projection, the anterior margin of the biopsy clip is seen on a
focal spot compression view. The clip is not visualized in the whole
breast 90 degree lateral view due to its far posterior position.
IMPRESSION: Satisfactory position of bilateral biopsy clips following bilateral
breast MRI guided biopsies.

Final Assessment: Post Procedure Mammograms for Marker Placement

## 2020-06-26 IMAGING — MG MM CLIP PLACEMENT
6 series · 6 of 6 positions shown · non-contrast
Comparison: Previous exam(s).

CLINICAL DATA: Bilateral breast MRI biopsies were performed today.

EXAM:
DIAGNOSTIC BILATERAL MAMMOGRAM POST MRI BIOPSIES

[L CC]
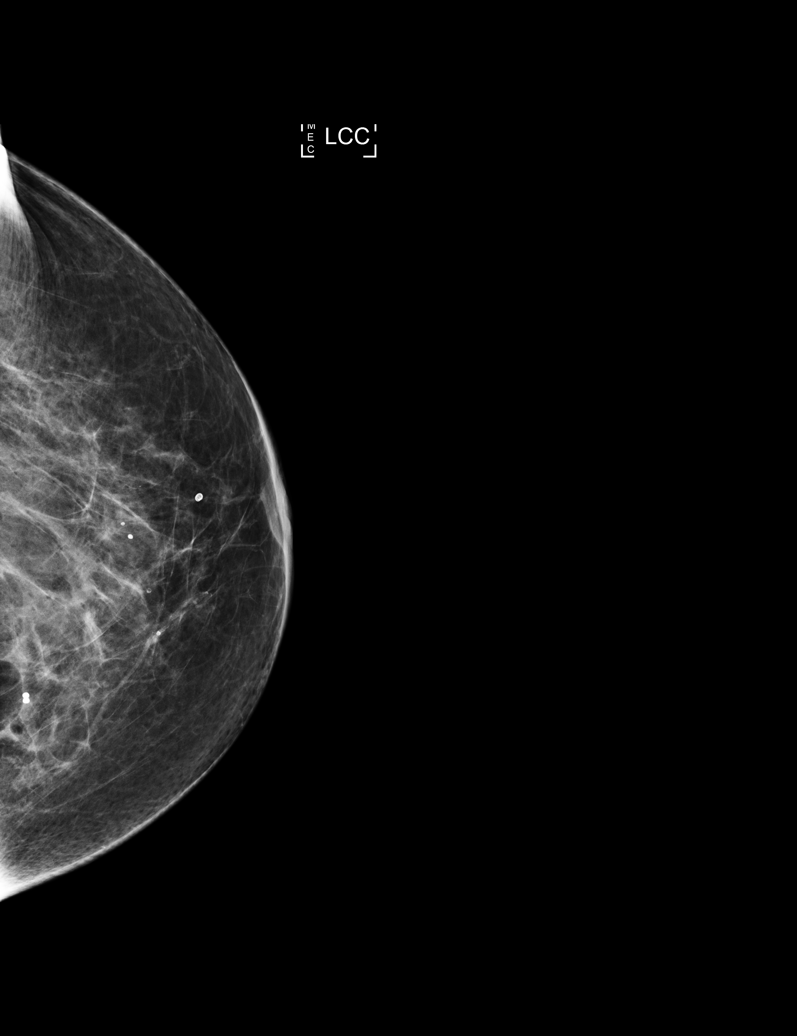

[L ML (1 of 5)]
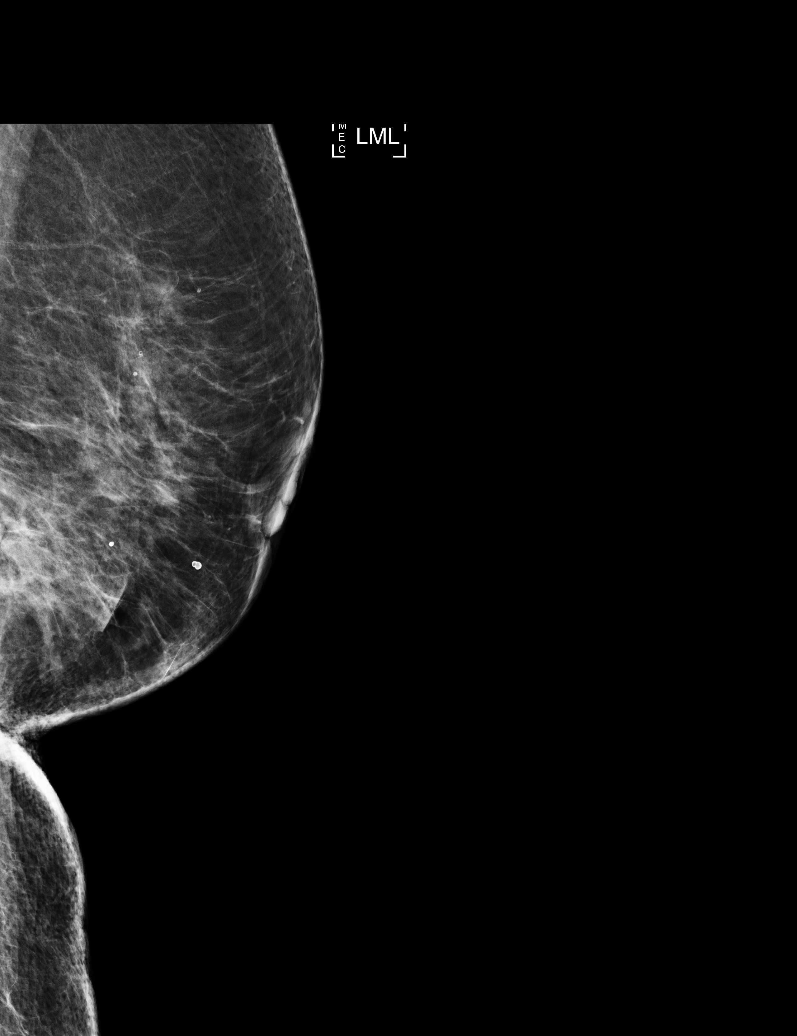

[L ML (2 of 5)]
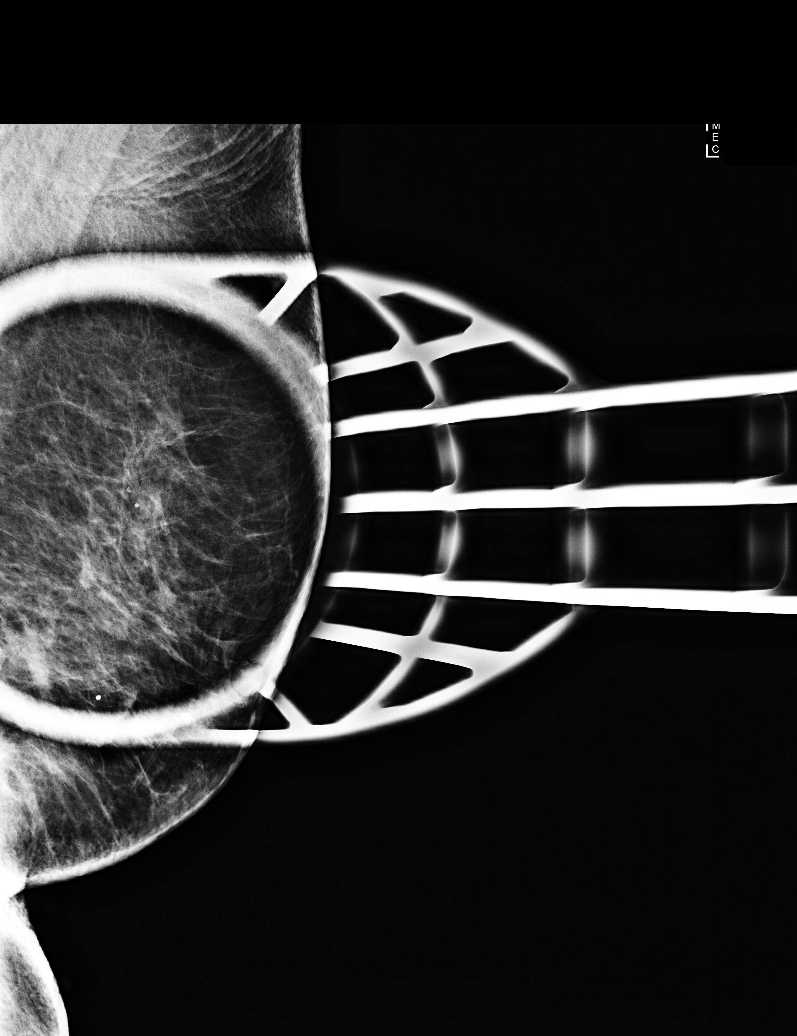

[L ML (3 of 5)]
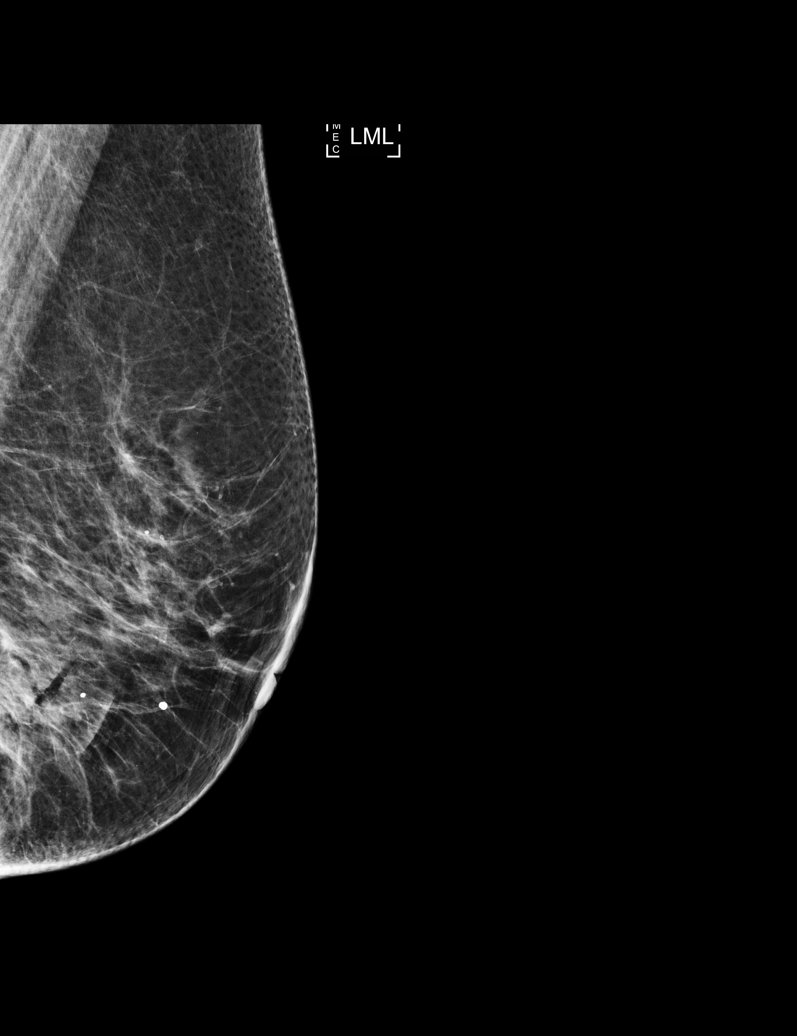

[L ML (4 of 5)]
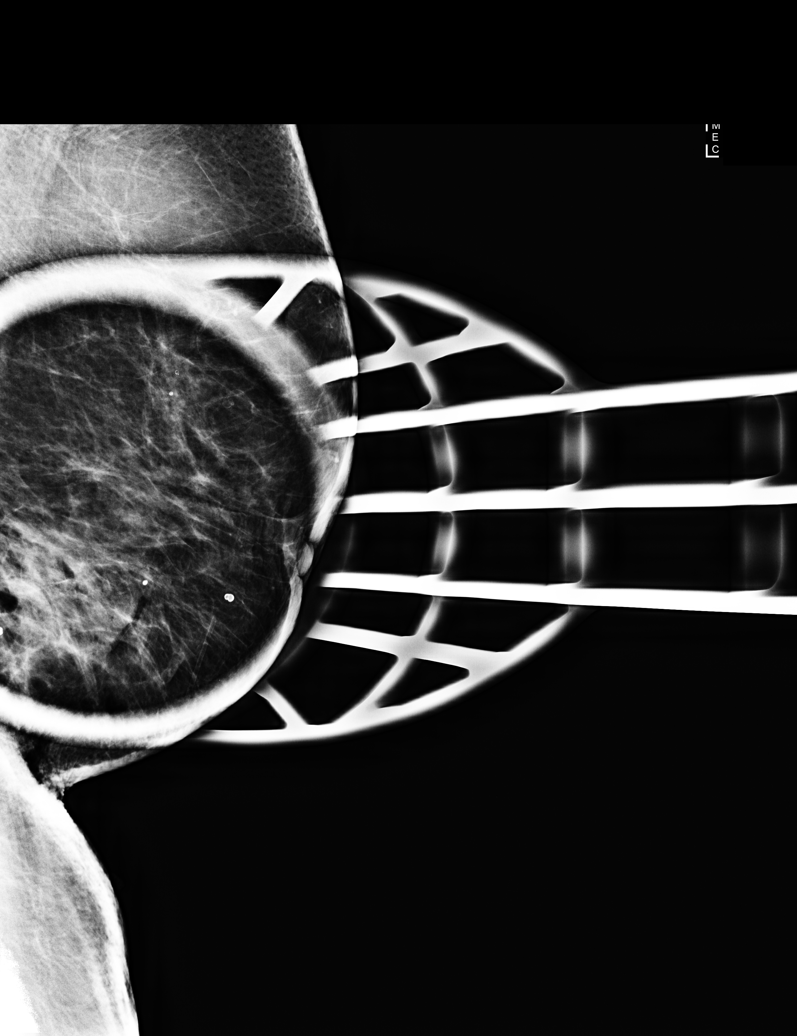

[L ML (5 of 5)]
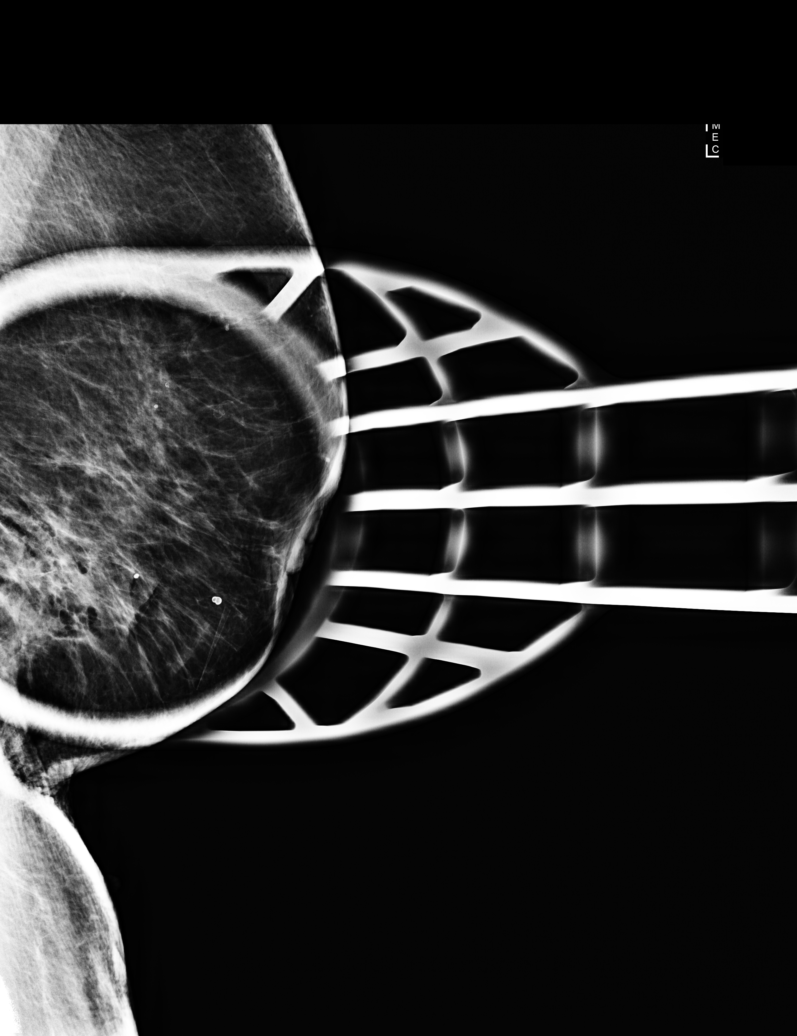

[6 of 6 positions shown; findings below may reference images not displayed]

FINDINGS: Mammographic images were obtained following MRI guided biopsy of
bilateral breasts. In the right breast, a dumbbell-shaped biopsy
clip is satisfactorily positioned in the axillary tail.

In the left breast, a dumbbell-shaped biopsy clip is seen in the far
posterior lower inner quadrant, in satisfactory position. In the ML
projection, the anterior margin of the biopsy clip is seen on a
focal spot compression view. The clip is not visualized in the whole
breast 90 degree lateral view due to its far posterior position.
IMPRESSION: Satisfactory position of bilateral biopsy clips following bilateral
breast MRI guided biopsies.

Final Assessment: Post Procedure Mammograms for Marker Placement

## 2020-07-03 DIAGNOSIS — J449 Chronic obstructive pulmonary disease, unspecified: Secondary | ICD-10-CM | POA: Diagnosis not present

## 2020-07-27 ENCOUNTER — Other Ambulatory Visit: Payer: Self-pay | Admitting: Oncology

## 2020-07-27 DIAGNOSIS — Z79811 Long term (current) use of aromatase inhibitors: Secondary | ICD-10-CM

## 2020-07-27 DIAGNOSIS — M8580 Other specified disorders of bone density and structure, unspecified site: Secondary | ICD-10-CM

## 2020-07-27 DIAGNOSIS — C50919 Malignant neoplasm of unspecified site of unspecified female breast: Secondary | ICD-10-CM

## 2020-07-31 DIAGNOSIS — I10 Essential (primary) hypertension: Secondary | ICD-10-CM | POA: Diagnosis not present

## 2020-07-31 DIAGNOSIS — C50411 Malignant neoplasm of upper-outer quadrant of right female breast: Secondary | ICD-10-CM | POA: Diagnosis not present

## 2020-07-31 DIAGNOSIS — E034 Atrophy of thyroid (acquired): Secondary | ICD-10-CM | POA: Diagnosis not present

## 2020-07-31 DIAGNOSIS — J449 Chronic obstructive pulmonary disease, unspecified: Secondary | ICD-10-CM | POA: Diagnosis not present

## 2020-07-31 DIAGNOSIS — Z170 Estrogen receptor positive status [ER+]: Secondary | ICD-10-CM | POA: Diagnosis not present

## 2020-07-31 DIAGNOSIS — Z23 Encounter for immunization: Secondary | ICD-10-CM | POA: Diagnosis not present

## 2020-07-31 DIAGNOSIS — E7800 Pure hypercholesterolemia, unspecified: Secondary | ICD-10-CM | POA: Diagnosis not present

## 2020-07-31 DIAGNOSIS — E119 Type 2 diabetes mellitus without complications: Secondary | ICD-10-CM | POA: Diagnosis not present

## 2020-07-31 DIAGNOSIS — J9611 Chronic respiratory failure with hypoxia: Secondary | ICD-10-CM | POA: Diagnosis not present

## 2020-08-03 DIAGNOSIS — J449 Chronic obstructive pulmonary disease, unspecified: Secondary | ICD-10-CM | POA: Diagnosis not present

## 2020-09-02 DIAGNOSIS — J449 Chronic obstructive pulmonary disease, unspecified: Secondary | ICD-10-CM | POA: Diagnosis not present

## 2020-10-03 DIAGNOSIS — J449 Chronic obstructive pulmonary disease, unspecified: Secondary | ICD-10-CM | POA: Diagnosis not present

## 2020-10-20 ENCOUNTER — Telehealth: Payer: Self-pay | Admitting: Oncology

## 2020-10-20 NOTE — Telephone Encounter (Signed)
Pt need to change her 2/17 appt due to her taking her husband to have his stents removed. I moved the appt to 2/23 as that was the first available.

## 2020-10-23 ENCOUNTER — Other Ambulatory Visit: Payer: Self-pay

## 2020-10-23 ENCOUNTER — Ambulatory Visit
Admission: RE | Admit: 2020-10-23 | Discharge: 2020-10-23 | Disposition: A | Discharge status: Home / Self Care | Payer: PPO | Source: Ambulatory Visit | Attending: Oncology | Admitting: Oncology

## 2020-10-23 DIAGNOSIS — N6324 Unspecified lump in the left breast, lower inner quadrant: Secondary | ICD-10-CM | POA: Diagnosis not present

## 2020-10-23 DIAGNOSIS — C50919 Malignant neoplasm of unspecified site of unspecified female breast: Secondary | ICD-10-CM | POA: Diagnosis not present

## 2020-10-23 DIAGNOSIS — N6323 Unspecified lump in the left breast, lower outer quadrant: Secondary | ICD-10-CM | POA: Diagnosis not present

## 2020-10-23 DIAGNOSIS — C50912 Malignant neoplasm of unspecified site of left female breast: Secondary | ICD-10-CM | POA: Diagnosis not present

## 2020-10-23 MED ORDER — GADOBUTROL 1 MMOL/ML IV SOLN
6.0000 mL | Freq: Once | INTRAVENOUS | Status: AC | PRN
  Administered 2020-10-23: 6 mL via INTRAVENOUS

## 2020-10-24 DIAGNOSIS — E034 Atrophy of thyroid (acquired): Secondary | ICD-10-CM | POA: Diagnosis not present

## 2020-10-24 DIAGNOSIS — E119 Type 2 diabetes mellitus without complications: Secondary | ICD-10-CM | POA: Diagnosis not present

## 2020-10-25 ENCOUNTER — Other Ambulatory Visit: Payer: Self-pay | Admitting: Oncology

## 2020-10-25 ENCOUNTER — Telehealth: Payer: Self-pay

## 2020-10-25 DIAGNOSIS — R9389 Abnormal findings on diagnostic imaging of other specified body structures: Secondary | ICD-10-CM

## 2020-10-25 DIAGNOSIS — R918 Other nonspecific abnormal finding of lung field: Secondary | ICD-10-CM

## 2020-10-25 DIAGNOSIS — N63 Unspecified lump in unspecified breast: Secondary | ICD-10-CM

## 2020-10-25 DIAGNOSIS — C50919 Malignant neoplasm of unspecified site of unspecified female breast: Secondary | ICD-10-CM

## 2020-10-25 NOTE — Telephone Encounter (Signed)
-----   Message from Earlie Server, MD sent at 10/24/2020 10:50 PM EST ----- Webb Silversmith,  Please arrange her to get MRI guided biopsy of the non-mass enhancement in the [place] and the mass in the [place] of the LEFT breast. Also please arrange her to get CT chest wo contrast to evaluate the lung mass. Thanks.

## 2020-10-25 NOTE — Telephone Encounter (Signed)
Done...  Pt CT scan has been sched as requested. I will notify pt and make her aware of her sched 11/07/20 appt

## 2020-10-25 NOTE — Telephone Encounter (Signed)
Webb Silversmith is putting in orders for the MRI (needs to be scheduled in [place]).  Please schedule as MD recommends.

## 2020-10-25 NOTE — Telephone Encounter (Signed)
Done.. Pt was made aware of all her sched appts 10/30/20 MRI BX  Pt must arrive by 6:50 And her 11/07/20 CT scan dates, time and location

## 2020-10-25 NOTE — Telephone Encounter (Signed)
Message from Madilyn Fireman, nurse navigator RN:  I have contacted [facility] .  They will schedule MR Guided biopsy

## 2020-10-26 ENCOUNTER — Other Ambulatory Visit: Payer: PPO

## 2020-10-26 ENCOUNTER — Ambulatory Visit: Payer: PPO | Admitting: Oncology

## 2020-10-28 ENCOUNTER — Other Ambulatory Visit: Payer: Self-pay | Admitting: Oncology

## 2020-10-30 ENCOUNTER — Ambulatory Visit
Admission: RE | Admit: 2020-10-30 | Discharge: 2020-10-30 | Disposition: A | Discharge status: Home / Self Care | Payer: PPO | Source: Ambulatory Visit | Attending: Oncology | Admitting: Oncology

## 2020-10-30 ENCOUNTER — Other Ambulatory Visit: Payer: Self-pay

## 2020-10-30 ENCOUNTER — Other Ambulatory Visit: Payer: Self-pay | Admitting: Body Imaging

## 2020-10-30 DIAGNOSIS — R9389 Abnormal findings on diagnostic imaging of other specified body structures: Secondary | ICD-10-CM

## 2020-10-30 DIAGNOSIS — D0512 Intraductal carcinoma in situ of left breast: Secondary | ICD-10-CM | POA: Diagnosis not present

## 2020-10-30 DIAGNOSIS — R928 Other abnormal and inconclusive findings on diagnostic imaging of breast: Secondary | ICD-10-CM | POA: Diagnosis not present

## 2020-10-30 MED ORDER — GADOBUTROL 1 MMOL/ML IV SOLN
6.0000 mL | Freq: Once | INTRAVENOUS | Status: AC | PRN
  Administered 2020-10-30: 6 mL via INTRAVENOUS

## 2020-10-31 DIAGNOSIS — E034 Atrophy of thyroid (acquired): Secondary | ICD-10-CM | POA: Diagnosis not present

## 2020-10-31 DIAGNOSIS — G2581 Restless legs syndrome: Secondary | ICD-10-CM | POA: Diagnosis not present

## 2020-10-31 DIAGNOSIS — Z0000 Encounter for general adult medical examination without abnormal findings: Secondary | ICD-10-CM | POA: Diagnosis not present

## 2020-10-31 DIAGNOSIS — E119 Type 2 diabetes mellitus without complications: Secondary | ICD-10-CM | POA: Diagnosis not present

## 2020-10-31 DIAGNOSIS — I10 Essential (primary) hypertension: Secondary | ICD-10-CM | POA: Diagnosis not present

## 2020-10-31 DIAGNOSIS — J9611 Chronic respiratory failure with hypoxia: Secondary | ICD-10-CM | POA: Diagnosis not present

## 2020-10-31 DIAGNOSIS — E7800 Pure hypercholesterolemia, unspecified: Secondary | ICD-10-CM | POA: Diagnosis not present

## 2020-10-31 DIAGNOSIS — C50411 Malignant neoplasm of upper-outer quadrant of right female breast: Secondary | ICD-10-CM | POA: Diagnosis not present

## 2020-10-31 DIAGNOSIS — J449 Chronic obstructive pulmonary disease, unspecified: Secondary | ICD-10-CM | POA: Diagnosis not present

## 2020-10-31 DIAGNOSIS — Z170 Estrogen receptor positive status [ER+]: Secondary | ICD-10-CM | POA: Diagnosis not present

## 2020-11-01 ENCOUNTER — Other Ambulatory Visit: Payer: Self-pay

## 2020-11-01 ENCOUNTER — Encounter: Payer: Self-pay | Admitting: Oncology

## 2020-11-01 ENCOUNTER — Inpatient Hospital Stay: Payer: PPO | Attending: Oncology | Admitting: Oncology

## 2020-11-01 ENCOUNTER — Inpatient Hospital Stay: Payer: PPO

## 2020-11-01 VITALS — BP 130/73 | HR 82 | Temp 99.3°F | Resp 16 | Wt 145.6 lb

## 2020-11-01 DIAGNOSIS — C50312 Malignant neoplasm of lower-inner quadrant of left female breast: Secondary | ICD-10-CM | POA: Insufficient documentation

## 2020-11-01 DIAGNOSIS — D0512 Intraductal carcinoma in situ of left breast: Secondary | ICD-10-CM

## 2020-11-01 DIAGNOSIS — Z87891 Personal history of nicotine dependence: Secondary | ICD-10-CM | POA: Insufficient documentation

## 2020-11-01 DIAGNOSIS — R918 Other nonspecific abnormal finding of lung field: Secondary | ICD-10-CM | POA: Diagnosis not present

## 2020-11-01 DIAGNOSIS — D1722 Benign lipomatous neoplasm of skin and subcutaneous tissue of left arm: Secondary | ICD-10-CM | POA: Insufficient documentation

## 2020-11-01 DIAGNOSIS — Z853 Personal history of malignant neoplasm of breast: Secondary | ICD-10-CM | POA: Diagnosis not present

## 2020-11-01 DIAGNOSIS — C50919 Malignant neoplasm of unspecified site of unspecified female breast: Secondary | ICD-10-CM

## 2020-11-01 DIAGNOSIS — Z79811 Long term (current) use of aromatase inhibitors: Secondary | ICD-10-CM | POA: Diagnosis not present

## 2020-11-01 DIAGNOSIS — Z170 Estrogen receptor positive status [ER+]: Secondary | ICD-10-CM | POA: Diagnosis not present

## 2020-11-01 DIAGNOSIS — M8580 Other specified disorders of bone density and structure, unspecified site: Secondary | ICD-10-CM

## 2020-11-01 LAB — CBC WITH DIFFERENTIAL/PLATELET
Abs Immature Granulocytes: 0.02 10*3/uL (ref 0.00–0.07)
Basophils Absolute: 0.1 10*3/uL (ref 0.0–0.1)
Basophils Relative: 1 %
Eosinophils Absolute: 0.4 10*3/uL (ref 0.0–0.5)
Eosinophils Relative: 5 %
HCT: 41.3 % (ref 36.0–46.0)
Hemoglobin: 13.1 g/dL (ref 12.0–15.0)
Immature Granulocytes: 0 %
Lymphocytes Relative: 27 %
Lymphs Abs: 2.1 10*3/uL (ref 0.7–4.0)
MCH: 28.4 pg (ref 26.0–34.0)
MCHC: 31.7 g/dL (ref 30.0–36.0)
MCV: 89.6 fL (ref 80.0–100.0)
Monocytes Absolute: 0.3 10*3/uL (ref 0.1–1.0)
Monocytes Relative: 4 %
Neutro Abs: 5 10*3/uL (ref 1.7–7.7)
Neutrophils Relative %: 63 %
Platelets: 306 10*3/uL (ref 150–400)
RBC: 4.61 MIL/uL (ref 3.87–5.11)
RDW: 13.8 % (ref 11.5–15.5)
WBC: 7.9 10*3/uL (ref 4.0–10.5)
nRBC: 0 % (ref 0.0–0.2)

## 2020-11-01 LAB — COMPREHENSIVE METABOLIC PANEL
ALT: 20 U/L (ref 0–44)
AST: 20 U/L (ref 15–41)
Albumin: 4.3 g/dL (ref 3.5–5.0)
Alkaline Phosphatase: 47 U/L (ref 38–126)
Anion gap: 10 (ref 5–15)
BUN: 23 mg/dL (ref 8–23)
CO2: 32 mmol/L (ref 22–32)
Calcium: 9.4 mg/dL (ref 8.9–10.3)
Chloride: 98 mmol/L (ref 98–111)
Creatinine, Ser: 0.89 mg/dL (ref 0.44–1.00)
GFR, Estimated: >60 mL/min (ref >60)
Glucose, Bld: 132 mg/dL — ABNORMAL HIGH (ref 70–99)
Potassium: 4.3 mmol/L (ref 3.5–5.1)
Sodium: 140 mmol/L (ref 135–145)
Total Bilirubin: 0.4 mg/dL (ref 0.3–1.2)
Total Protein: 7.3 g/dL (ref 6.5–8.1)

## 2020-11-01 LAB — VITAMIN D 25 HYDROXY (VIT D DEFICIENCY, FRACTURES): Vit D, 25-Hydroxy: 43.78 ng/mL (ref 30–100)

## 2020-11-01 NOTE — Progress Notes (Signed)
Hematology/Oncology follow up  note [facility] Telephone:([phone] Fax:([phone]) [phone]   Patient Care Team: Baxter Hire, MD as PCP - General (Internal Medicine) Breast Surgeon Dr.Sakai.   CHIEF COMPLAINTS/REASON FOR VISIT:  Follow up  of breast cancer  HISTORY OF PRESENTING ILLNESS:  Amy Woodard is a  80 y.o.  female with PMH listed below who was referred to me for evaluation of newly diagnosed breast cancer. Patient had mammogram and ultrasound on 03/19/2018 which showed right breast 12:00 1.6 x 1.3 x 1.5 cm mass, no radiographically enlarged adenopathy.  Patient also report feeling a mass for the past couple of months. Biopsy pathology showed: Invasive mammary carcinoma, no special type, grade 3, DCIS present, lymphovascular invasion not identified, ER PR 90% positive, HER-2 negative  Nipple discharge: Denies Family history: Mother had melanoma, passed away at age of 84, sister had cervical cancer.  Sister with colon cancer, maternal grandmother sister OCP use: denies.  Estrogen and progesterone therapy: denies History of radiation to chest: denies.  Previous breast surgery: Denies  She has a complicated course of rest cancer diagnosis and surgery. # 04/16/2018 status post right breast mass lumpectomy and sentinel lymph node biopsy.  Pathology showed invasive mammary carcinoma 1.7 cm, DCIS, high-grade with extensive involvement of lobules.  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  Sentinel lymph node biopsy negative. pT1c pN0. ER >90%, PR >90%, HER 2 negative.   05/14/2018 patient underwent MRI breast bilaterally to determined extent of positive deep margin/lobular type breast cancer. MRI breast showed an irregular 1.4 cm mass in the lower inner quadrant of the left breast. Abnormal non-mass enhancement in the upper outer right breast, axillary tail region could be secondary to postsurgical changes, malignancy  cannot be excluded. 06/05/2018 left breast mass biopsy showed invasive ductal carcinoma, grade 3, this was [facility] [place] [place] ER 100%, PR 50%, not enough tissue for HER 2 analysis.  07/03/2018 patient underwent right breast deep margin lumpectomy/reexcision: Pathology showed negative for residual malignancy. Left breast needle localized lumpectomy showed negative for residual malignancy.  Sentinel lymph node negative In addition the outer slides on this patient's left breast biopsy were requested and reviewed by Dr. RReuel Derby  Dr. RReuel Derbyfeels the patient's biopsy showed high-grade DCIS with focal areas suspicious for invasion.  Definitive invasive carcinoma is not identified.  DCIS is present in both outside blocks and slides from biopsy and measures 7 mm and 6 mm respectively.  pTis pN0 Patient case have been discussed on breast tumor board multiple times with reviewing of images and pathology findings. Left breast MRI did show a 1.4 cm irregularity, and pathology only 7 mm and a 6 mm DCIS were found at the initial left breast biopsy.  Final left breast lumpectomy did not contain clips which was placed with MRI guidance at biopsy.. Per my discussion with surgeon Dr.Sakai, even clips are found on repeat image, re-excision is not feasible and the next step will be mastectomy.   # 07/03/2018-07/06/2018 admitted for pain control, sentinel lymph node biopsy site hematoma,  #07/10/2018 - 07/14/2018 Patient admitted due to acute on chronic anemia due to hematoma.  She was admitted for evacuation of postoperative left axillary sentinel lymph node biopsy site hematoma.  Status post PRBC transfusion during her admission. # 08/14/2018-08/17/2018 readmitted due to breast cellulitis despite outpatient drainage and oral antibiotics.  # 08/24/2018- 08/28/2018 readmitted due to recurrent right breast abscess.  She was placed on IV antibiotics with Ceftriaxone via picc line.  #A  repeat US has been done on 09/07/2018 and it shows near resolution of the fluid. # #Finished IV Ceftriaxone on 09/11/2018.   Patient has a history of chronic respiratory failure/ COPD, on home oxygen.  # Right breast pT1c pN0, reexcision did not reveal any residual disease.. Oncotype DX showed recurrence rate of 31, patient has 15% benefit from adjuvant chemotherapy.  Decision was made not to proceed with adjuvant chemotherapy due to significant delays secondary to surgical complication, slow recovery from surgery, borderline performance status and comorbidities.  Patient declined radiation due to severe lung condition and is afraid of lung toxicities.[name]  #Left breast high-grade DCIS She has been tolerating antiestrogen treatment with Arimidex very well.  Manageable side effects. left breast biopsy clip was not found in the specimen. Continue Arimidex, tolerates well.   10/27/2018 left unilateral diagnostic mammogram showed left breast lower inner quadrant clip lstill presents results was discussed with patient and patient's surgeon Dr. Lysle Pearl.  Dr. Lysle Pearl feels the only option will be mastectomy. Discussed with patient that there is chance of remaining cancer in her left breast in the area where the clip was placed. Patient is not interested in additional breast surgeries.  Continue antiestrogen treatments and surveillance mammogram.  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 Recommend patient to follow-up with primary care provider.  If it continues to bother her she needs to see a surgery for evaluation and resection.  #Started on ArimideX1/05/2019   INTERVAL HISTORY Amy Woodard is a 80 y.o. female who has above history reviewed by me today presents for follow up visit for management of breast cancer. Patient reports chronic shortness of breath is better. 10/23/2020, MRI breast bilaterally showed indeterminate stippled linear non-mass enhancement involving  the lower inner quadrant of the left breast extending from the prior lumpectomy site anteriorly, spanning 2.5 cm.  Indeterminate 0.7 cm mass involving the lower outer quadrant of the left breast at a posterior depth.  Indeterminate 0.9 cm masslike enhancement involving the right middle lobe laterally.  No pathologic lymphadenopathy.  No MRI evidence of malignancy involving the right breast.  Patient underwent biopsy of the linear none mass enhancement in the lower inner posterior left breast.  The MRI biopsy of the second edition was unable to be performed    Review of Systems  Constitutional: Negative for chills, fever, malaise/fatigue and weight loss.  HENT: Negative for nosebleeds and sore throat.   Eyes: Negative for double vision, photophobia and redness.  Respiratory: Positive for shortness of breath. Negative for cough and wheezing.   Cardiovascular: Negative for chest pain, palpitations, orthopnea and leg swelling.  Gastrointestinal: Negative for abdominal pain, blood in stool, nausea and vomiting.  Genitourinary: Negative for dysuria.  Musculoskeletal: Negative for myalgias.  Skin: Negative for itching and rash.  Neurological: Negative for dizziness, tingling and tremors.  Endo/Heme/Allergies: Negative for environmental allergies. Does not bruise/bleed easily.  Psychiatric/Behavioral: Negative for depression and hallucinations.    MEDICAL HISTORY:  Past Medical History:  Diagnosis Date   Allergy    Arthritis    rheumatoid arthritis   Breast cancer ([place]) 05/2018   Right breast found 1st, then left breast with ductal ca   Cellulitis of breast 05/2018   right breast cellulitis after first biopsy,  antibiotics prescribed   COPD (chronic obstructive pulmonary disease) (HCC)    Diabetes mellitus without complication ([place])  Dyspnea    GERD (gastroesophageal reflux disease)    occasionally   History of kidney stones    has one but not a problem   Hypothyroidism     Oxygen dependent    2liter nasal prong continuously    SURGICAL HISTORY: Past Surgical History:  Procedure Laterality Date   APPENDECTOMY     BACK SURGERY     lumbar. herniated disc . no metal   BREAST BIOPSY Right 03/19/2018   INVASIVE MAMMARY CARCINOMA, NO SPECIAL TYPE   BREAST BIOPSY Left 06/01/2018   MRI bx, grade III invasive ductal carcinoma   BREAST BIOPSY Right 06/01/2018   MRI bx of enhancing mass, path showed FOREIGN BODY GRANULOMATOUS RESPONSE    BREAST LUMPECTOMY Right 04/16/2018   [facility], DCIS, LN negative   BREAST LUMPECTOMY Right 07/03/2018   Procedure: RE-EXCISION OF RIGHT BREAST TISSUE MASS;  Surgeon: Benjamine Sprague, DO;  Location: [facility] ORS;  Service: General;  Laterality: Right;   BREAST LUMPECTOMY Left 06/2018   invasive ductal   IRRIGATION AND DEBRIDEMENT HEMATOMA Left 07/10/2018   Procedure: IRRIGATION AND DEBRIDEMENT HEMATOMA LEFT AXILLARY;  Surgeon: Benjamine Sprague, DO;  Location: [facility] ORS;  Service: General;  Laterality: Left;   PARTIAL MASTECTOMY WITH NEEDLE LOCALIZATION AND AXILLARY SENTINEL LYMPH NODE BX Right 04/16/2018   Procedure: PARTIAL MASTECTOMY WITH NEEDLE LOCALIZATION AND AXILLARY SENTINEL LYMPH NODE BX;  Surgeon: Benjamine Sprague, DO;  Location: [facility] ORS;  Service: General;  Laterality: Right;   PARTIAL MASTECTOMY WITH NEEDLE LOCALIZATION AND AXILLARY SENTINEL LYMPH NODE BX Left 07/03/2018   Procedure: PARTIAL MASTECTOMY WITH NEEDLE LOCALIZATION AND SENTINEL LYMPH NODE BX;  Surgeon: Benjamine Sprague, DO;  Location: [facility] ORS;  Service: General;  Laterality: Left;    SOCIAL HISTORY: Social History   Socioeconomic History   Marital status: Married    Spouse name: richard..disabled   Number of children: Not on file   Years of education: Not on file   Highest education level: Not on file  Occupational History   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  Tobacco Use   Smoking status: Former Smoker    Types: Cigarettes    Quit  date: 03/30/1993    Years since quitting: 27.6   Smokeless tobacco: Never Used  Vaping Use   Vaping Use: Never used  Substance and Sexual Activity   Alcohol use: No   Drug use: Never   Sexual activity: Not Currently  Other Topics Concern   Not on file  Social History Narrative   Not on file   Social Determinants of Health   Financial Resource Strain: Not on file  Food Insecurity: Not on file  Transportation Needs: Not on file  Physical Activity: Not on file  Stress: Not on file  Social Connections: Not on file  Intimate Partner Violence: Not on file    FAMILY HISTORY: Family History  Problem Relation Age of Onset   Clotting disorder Mother    Melanoma Mother        deceased 62   Lung cancer Sister        deceased 53s; smoker   Alcohol abuse Father        deceased 1   Cervical cancer Other        neice   Breast cancer Neg Hx     ALLERGIES:  is allergic to leflunomide and sulfa antibiotics.  MEDICATIONS:  Current Outpatient Medications  Medication Sig Dispense Refill   anastrozole (ARIMIDEX) 1 MG tablet TAKE  1 TABLET BY MOUTH EVERY DAY 90 tablet 3   azelastine (ASTELIN) 0.1 % nasal spray Place 1 spray into both nostrils 2 (two) times daily.      Cholecalciferol (VITAMIN D3) 50 MCG (2000 UT) capsule TAKE 1 CAPSULE BY MOUTH EVERY DAY 90 capsule 0   fluticasone (FLONASE) 50 MCG/ACT nasal spray SPRAY 2 SPRAYS INTO EACH NOSTRIL EVERY DAY     folic acid (FOLVITE) 1 MG tablet Take 1 mg by mouth daily.     glipiZIDE (GLUCOTROL XL) 5 MG 24 hr tablet Take 1 tablet by mouth daily.     hydroxychloroquine (PLAQUENIL) 200 MG tablet Take 400 mg by mouth daily.      Ipratropium-Albuterol (COMBIVENT) 20-100 MCG/ACT AERS respimat Inhale into the lungs.     irbesartan (AVAPRO) 75 MG tablet Take 75 mg by mouth daily.     levothyroxine (SYNTHROID, LEVOTHROID) 50 MCG tablet Take 50 mcg by mouth daily before breakfast.     losartan (COZAAR) 25 MG tablet Take 1  tablet by mouth daily.     methotrexate (RHEUMATREX) 2.5 MG tablet Take by mouth. Take 4 tablets (10 mg total) by mouth every 7 (seven) days     omeprazole (PRILOSEC) 20 MG capsule Take 20 mg by mouth daily as needed.      budesonide-formoterol (SYMBICORT) 160-4.5 MCG/ACT inhaler Inhale 2 puffs into the lungs 2 (two) times daily.      Calcium Carbonate-Vit D-Min (CALCIUM 1200) 1200-1000 MG-UNIT CHEW Chew 1 tablet by mouth daily. (Patient not taking: Reported on 11/01/2020) 90 each 1   No current facility-administered medications for this visit.     PHYSICAL EXAMINATION: ECOG PERFORMANCE STATUS: 1 - Symptomatic but completely ambulatory Vitals:   11/01/20 1416  BP: 130/73  Pulse: 82  Resp: 16  Temp: 99.3 F (37.4 C)   Filed Weights   11/01/20 1416  Weight: 145 lb 9.6 oz (66 kg)    Physical Exam Constitutional:      General: She is not in acute distress.    Comments: On home oxygen  HENT:     Head: Normocephalic and atraumatic.  Eyes:     General: No scleral icterus.    Conjunctiva/sclera: Conjunctivae normal.     Pupils: Pupils are equal, round, and reactive to light.  Cardiovascular:     Rate and Rhythm: Normal rate and regular rhythm.     Heart sounds: Normal heart sounds.  Pulmonary:     Effort: Pulmonary effort is normal. No respiratory distress.     Comments: Decreased breath sound bilaterally Abdominal:     General: Bowel sounds are normal. There is no distension.     Palpations: Abdomen is soft. There is no mass.     Tenderness: There is no abdominal tenderness.  Musculoskeletal:        General: No deformity. Normal range of motion.     Cervical back: Normal range of motion and neck supple.  Lymphadenopathy:     Cervical: No cervical adenopathy.  Skin:    General: Skin is warm and dry.     Findings: No erythema or rash.     Comments: Left side posterior upper chest wall soft tissue nodule previously worked up which is likely a lipoma  Neurological:      General: No focal deficit present.     Mental Status: She is alert and oriented to person, place, and time.     Cranial Nerves: No cranial nerve deficit.     Coordination: Coordination normal.  Psychiatric:        Mood and Affect: Mood normal.    Breast exam was performed in seated  position. Patient is status post bilateral lumpectomy.  Recent left breast biopsy with focal tissue thickening and tenderness around previous lumpectomy site No palpable right breast mass or palpable axillary lymphadenopathy.     LABORATORY DATA:  I have reviewed the data as listed Lab Results  Component Value Date   WBC 7.9 11/01/2020   HGB 13.1 11/01/2020   HCT 41.3 11/01/2020   MCV 89.6 11/01/2020   PLT 306 11/01/2020   Recent Labs    12/31/19 0919 04/20/20 0950 11/01/20 1401  NA 141 141 140  K 3.9 4.2 4.3  CL 96* 97* 98  CO2 35* 37* 32  GLUCOSE 187* 79 132*  BUN 14 15 23   CREATININE 0.83 0.76 0.89  CALCIUM 9.3 9.4 9.4  GFRNONAA >60 >60 >60  GFRAA >60 >60  --   PROT 7.3 7.4 7.3  ALBUMIN 4.1 4.3 4.3  AST 17 17 20   ALT 13 13 20   ALKPHOS 53 48 47  BILITOT 0.5 0.5 0.4   Iron/TIBC/Ferritin/ %Sat No results found for: IRON, TIBC, FERRITIN, IRONPCTSAT      ASSESSMENT & PLAN:  1. Ductal carcinoma in situ (DCIS) of left breast   2. History of invasive breast cancer   3. Osteopenia, unspecified location   4. Lung mass    #Right invasive carcinoma of breast and the left DCIS-mammographic occult malignancy Currently on Arimidex 1 mg daily, continue for now. Recent MRI breast images were independently reviewed by me and discussed with patient. MRI biopsy result was reviewed with patient Recurrent high-grade DCIS of the left breast, the other smaller mass left outer quadrant not able to biopsied. Estrogen receptor was not checked on the specimen. Recommend patient to reestablish care with Dr. Lysle Pearl for discussion of mastectomy. Discussed with Dr.Sakai via secure chat.  #Osteopenia  -continue calcium and vitamin D supplementation.  Patient declined bisphosphonate.   # Indeterminate 0.9 cm masslike enhancement involving the right middle lobe laterally Obtain CT chest w contrast for further work up.   #Lipoma, previously ultrasound showed subcutaneous lesion measuring 4.7 x 1 x 5.1 cm most consistent with a benign lipoma. I have previously  recommend patient to see surgery for discussion of resection.   All questions were answered. The patient knows to call the clinic with any problems questions or concerns.  Return of visit: TBD Earlie Server, MD, PhD 11/01/2020

## 2020-11-01 NOTE — Progress Notes (Unsigned)
Patient here for f/u after breast biopsy.

## 2020-11-01 NOTE — Progress Notes (Signed)
Patient has received biopsy results per Stacie Acres at [facility]. States she has an appointment today with Dr. Tasia Catchings to review results, and follow-up plan.

## 2020-11-02 ENCOUNTER — Telehealth: Payer: Self-pay

## 2020-11-02 DIAGNOSIS — R918 Other nonspecific abnormal finding of lung field: Secondary | ICD-10-CM

## 2020-11-02 NOTE — Telephone Encounter (Signed)
Pt already schedule for CT chest w/o on 3/1. Keep appt as is, per MD.

## 2020-11-02 NOTE — Telephone Encounter (Signed)
Please schedule patient for CT chest and notify her of appt. Dr. Tasia Catchings has notified pt via [place].

## 2020-11-02 NOTE — Telephone Encounter (Signed)
-----   Message from Earlie Server, MD sent at 11/01/2020 10:25 PM EST ----- My chart message sent. Please arrange CT chest w contrast. Reason is lung mass.

## 2020-11-06 DIAGNOSIS — D0512 Intraductal carcinoma in situ of left breast: Secondary | ICD-10-CM | POA: Diagnosis not present

## 2020-11-07 ENCOUNTER — Ambulatory Visit
Admission: RE | Admit: 2020-11-07 | Discharge: 2020-11-07 | Disposition: A | Discharge status: Home / Self Care | Payer: PPO | Source: Ambulatory Visit | Attending: Oncology | Admitting: Oncology

## 2020-11-07 ENCOUNTER — Other Ambulatory Visit: Payer: Self-pay

## 2020-11-07 ENCOUNTER — Ambulatory Visit: Payer: Self-pay | Admitting: Surgery

## 2020-11-07 ENCOUNTER — Ambulatory Visit: Payer: PPO

## 2020-11-07 DIAGNOSIS — R918 Other nonspecific abnormal finding of lung field: Secondary | ICD-10-CM | POA: Insufficient documentation

## 2020-11-07 DIAGNOSIS — J432 Centrilobular emphysema: Secondary | ICD-10-CM | POA: Diagnosis not present

## 2020-11-07 DIAGNOSIS — Z01818 Encounter for other preprocedural examination: Secondary | ICD-10-CM | POA: Diagnosis not present

## 2020-11-07 DIAGNOSIS — C50919 Malignant neoplasm of unspecified site of unspecified female breast: Secondary | ICD-10-CM | POA: Diagnosis not present

## 2020-11-07 DIAGNOSIS — I2510 Atherosclerotic heart disease of native coronary artery without angina pectoris: Secondary | ICD-10-CM | POA: Diagnosis not present

## 2020-11-07 DIAGNOSIS — J449 Chronic obstructive pulmonary disease, unspecified: Secondary | ICD-10-CM | POA: Diagnosis not present

## 2020-11-07 DIAGNOSIS — R0600 Dyspnea, unspecified: Secondary | ICD-10-CM | POA: Diagnosis not present

## 2020-11-07 DIAGNOSIS — J8410 Pulmonary fibrosis, unspecified: Secondary | ICD-10-CM | POA: Diagnosis not present

## 2020-11-07 DIAGNOSIS — C50912 Malignant neoplasm of unspecified site of left female breast: Secondary | ICD-10-CM | POA: Diagnosis not present

## 2020-11-07 NOTE — H&P (Signed)
Subjective:   CC: Ductal carcinoma in situ (DCIS) of left breast [D05.12] HPI:  Amy Woodard is a 79 y.o. female who returns for evaluation of above. Change was noted on last screening mammogram.   Biopsy confirmed from one area suspicious on mammogram.  Other area unable to be biopsied due to location.    Past Medical History:  has a past medical history of Allergic state, COPD (chronic obstructive pulmonary disease) (CMS-HCC), Family history of colon cancer, and GERD (gastroesophageal reflux disease).  Past Surgical History:  has a past surgical history that includes Appendectomy; Spine surgery; Hemorrhoidectomy By Simple Ligation; lumpectomy with sentinal lymph node biopsy (Right, 04/2018); lumpectomy with sentinel lymph node biopsy (Left, 06/2018); and axillary hematoma evacuation (Left, 07/2018).  Family History: family history includes Alcohol abuse in her father; Colon cancer in her mother; Lung cancer in her sister; Ovarian cancer in an other family member; Stroke in her father and mother; Uterine cancer in her sister.  Social History:  reports that she quit smoking about 32 years ago. Her smoking use included cigarettes. She has a 31.00 pack-year smoking history. She has never used smokeless tobacco. She reports that she does not drink alcohol and does not use drugs.  Current Medications: has a current medication list which includes the following prescription(s): anastrozole, azelastine, cholecalciferol, fluticasone propionate, folic acid, glipizide, hydroxychloroquine, ipratropium-albuterol, irbesartan, levothyroxine, losartan, methotrexate, and omeprazole.  Allergies:  Allergies as of 11/06/2020 - Reviewed 11/06/2020  Allergen Reaction Noted  . Leflunomide Headache 03/09/2020  . Sulfa (sulfonamide antibiotics) Itching and Rash 01/08/2014  . Sulfasalazine Itching 12/12/2016    ROS:  A 15 point review of systems was performed and was negative except as noted in HPI    Objective:     BP (!) 140/68   Pulse 88   Ht 172.7 cm (5' 8")   Wt 67.6 kg (149 lb)   BMI 22.66 kg/m   Constitutional :  alert, appears stated age, cooperative and no distress  Lymphatics/Throat:  no asymmetry, masses, or scars  Respiratory:  clear to auscultation bilaterally  Cardiovascular:  regular rate and rhythm  Gastrointestinal: soft, non-tender; bowel sounds normal; no masses,  no organomegaly.   Musculoskeletal: Steady gait and movement  Skin: Cool and moist,   Psychiatric: Normal affect, non-agitated, not confused  Breast:  Chaperone present for exam.  breasts appear normal, no suspicious masses, no skin or nipple changes or axillary nodes.    LABS:  n/a   RADS: CLINICAL DATA: 79-year-old with a personal history of BILATERAL  malignant lumpectomies in October, 2019. As the LEFT breast  malignancy was only identified on MRI, follow-up examination is  performed.   LABS: Not applicable.   EXAM:  BILATERAL BREAST MRI WITH AND WITHOUT CONTRAST   TECHNIQUE:  Multiplanar, multisequence MR images of both breasts were obtained  prior to and following the intravenous administration of 6 ml of  Gadavist.   Three-dimensional MR images were rendered by post-processingof the  original MR data on an independent workstation. The  three-dimensional MR images were interpreted, and findings are  reported in the following complete MRI report for this study. Three  dimensional images were evaluated at the independent interpreting  workstation using the DynaCAD thin client.   COMPARISON: Previous exams, most recently mammography 04/17/2020  and MRI 06/21/2019.   FINDINGS:  Breast composition: b. Scattered fibroglandular tissue.   Background parenchymal enhancement: Minimal.   Right breast: No suspicious mass or abnormal enhancement. Expected  post lumpectomy changes. Blooming   artifact related to the  dumbbell-shaped tissue marker clip in the UPPER OUTER QUADRANT at   the site of prior benign biopsy.   Left breast: Newstippled linear non-mass enhancement involving the  LOWER INNER QUADRANT at MIDDLE to POSTERIOR depth, extending from  the prior lumpectomy site anteriorly, spanning approximately 2.5 cm,  demonstrating washout kinetics.   Approximate 0.7 cm mass in the LOWER OUTER QUADRANT at POSTERIOR  depth with central fat, not conspicuous on the prior examinations,  demonstrating plateau kinetics.   Lymph nodes: No pathologic lymphadenopathy.   Ancillary findings: Indeterminate enhancement involving the LATERAL  aspect of the RIGHT MIDDLE LOBE measuring approximately 0.9 cm.   IMPRESSION:  1. Indeterminate stippled linear non-mass enhancement involving the  LOWER INNER QUADRANT of the LEFT breast, extending from the prior  lumpectomy site anteriorly, spanning2.5 cm.  2. Indeterminate 0.7 cm mass involving the LOWER OUTER QUADRANT of  the LEFT breast at POSTERIOR depth.  3. Indeterminate 0.9 cm masslike enhancement involving the RIGHT  MIDDLE LOBE laterally.  4. No pathologic lymphadenopathy.  5. No MRI evidence of malignancy involving the RIGHT breast   RECOMMENDATION:  1. MRI guided biopsy of the non-mass enhancement in the LOWER INNER  QUADRANT and the mass in the LOWER OUTER QUADRANT of the LEFT  breast.  2. CT of the chest with contrast to further evaluate the enhancement  in the RIGHT MIDDLE LOBE.   BI-RADS CATEGORY 4: Suspicious.    CLINICAL DATA: 79-year-old female presenting for MRI guided biopsy  of 2 lesions in the left breast. Unfortunately, one site became  inaccessible after some slight movement during the procedure, and  only one biopsy was performed today.   EXAM:  MRI GUIDED CORE NEEDLE BIOPSY OF THE LEFT BREAST   TECHNIQUE:  Multiplanar, multisequence MR imaging of the left breast was  performed both before and after administration of intravenous  contrast.   CONTRAST: 6mL GADAVIST GADOBUTROL 1 MMOL/ML IV  SOLN   COMPARISON: Previous exams.   FINDINGS:  I met with the patient, and we discussed the procedure of MRI guided  biopsy, including risks, benefits, and alternatives. Specifically,  we discussed the risks of infection, bleeding, tissue injury, clip  migration, and inadequate sampling. Informed, written consent was  given. The usual time out protocol was performed immediately prior  to the procedure.   Using sterile technique, 1% Lidocaine, MRI guidance, and a 9 gauge  vacuum assisted device, biopsy was performed of linear non mass  enhancement in the lower inner middle depth of the left breast using  a lateral approach. At the conclusion of the procedure, a  dumbbell-shaped tissue marker clip was deployed into the biopsy  cavity.   There was mild movement by the patient during numbing with lidocaine  for the first lesion. Due to the far inferior and posterior location  of the second lesion, this was not accessible after needle placement  for the first lesion. Therefore, this biopsy was not performed  today.   Follow-up 2-view mammogram was performed and dictated separately.   IMPRESSION:  1. MRI guided biopsy of linear non mass enhancement in the lower  inner posterior left breast. No apparent complications.   2. The MRI biopsy for the second lesion was unable to be performed  today.   Electronically Signed:  By: Michelle Collins M.D.  On: 10/30/2020 09:53  ADDENDUM: Pathology revealed HIGH GRADE FOCAL DUCTAL CARCINOMA IN SITU WITH NECROSIS, FAT NECROSIS, FIBROCYSTIC CHANGE of the LEFT breast, lower inner   quadrant. This was found to be concordant by Dr. Michelle Collins.  Pathology results were discussed with the patient by telephone. The patient reported doing well after the biopsy with tenderness at the site. Post biopsy instructions and care were reviewed and questions were answered. The patient was encouraged to call [facility]  for any additional concerns.  Results and recommendations forwarded to Dr. Zhou Yu of [facility] and Dr. Mahati Vajda of [facility] and Anne Shaver RN Oncology Nurse Navigator of [facility] via Epic messaging. Anne Shaver RN will attempt to reach patient today regarding surgical appointment.  There was mild movement by the patient during numbing with lidocaine for the first lesion. Due to the far inferior and posterior location of the second lesion, this was not accessible after needle placement for the first lesion. Therefore, this biopsy was not performed. We can reschedule her for a second attempt at biopsy of the second lesion if this will alter management  Pathology results reported by Susan Eaton RN on 10/31/2020.   Electronically Signed   By: Michelle  Collins M.D.   On: 10/31/2020 15:13  Assessment:   Ductal carcinoma in situ (DCIS) of left breast [D05.12]  Recurrent, biopsy proven with one area suspicious but unable to biopsy located in separate quadrant.  Due to previous surgery and limited tissue remaining, along with recurrence, recommended proceeding with left mastectomy  Plan:     1. Ductal carcinoma in situ (DCIS) of left breast [D05.12]  Discussed the risk of surgery including recurrence, chronic pain, post-op infxn, poor/delayed wound healing, poor cosmesis, seroma, hematoma formation, and possible re-operation to address said risks. The risks of general anesthetic, if used, includes MI, CVA, sudden death or even reaction to anesthetic medications also discussed.  Typical post-op recovery time and possbility of activity restrictions were also discussed.  Alternatives include continued observation.  Benefits include possible symptom relief, pathologic evaluation, and/or curative excision.   The patient verbalized understanding and all questions were answered to the patient's satisfaction.  2. Patient has elected to proceed  with surgical treatment. Procedure will be scheduled.  Left mastectomy with repeat SLNB, due to high grade DCIS.  Written consent was obtained.   

## 2020-11-07 NOTE — H&P (View-Only) (Signed)
Subjective:   CC: Ductal carcinoma in situ (DCIS) of left breast [D05.12] HPI:  Amy Woodard is a 79 y.o. female who returns for evaluation of above. Change was noted on last screening mammogram.   Biopsy confirmed from one area suspicious on mammogram.  Other area unable to be biopsied due to location.    Past Medical History:  has a past medical history of Allergic state, COPD (chronic obstructive pulmonary disease) (CMS-HCC), Family history of colon cancer, and GERD (gastroesophageal reflux disease).  Past Surgical History:  has a past surgical history that includes Appendectomy; Spine surgery; Hemorrhoidectomy By Simple Ligation; lumpectomy with sentinal lymph node biopsy (Right, 04/2018); lumpectomy with sentinel lymph node biopsy (Left, 06/2018); and axillary hematoma evacuation (Left, 07/2018).  Family History: family history includes Alcohol abuse in her father; Colon cancer in her mother; Lung cancer in her sister; Ovarian cancer in an other family member; Stroke in her father and mother; Uterine cancer in her sister.  Social History:  reports that she quit smoking about 32 years ago. Her smoking use included cigarettes. She has a 31.00 pack-year smoking history. She has never used smokeless tobacco. She reports that she does not drink alcohol and does not use drugs.  Current Medications: has a current medication list which includes the following prescription(s): anastrozole, azelastine, cholecalciferol, fluticasone propionate, folic acid, glipizide, hydroxychloroquine, ipratropium-albuterol, irbesartan, levothyroxine, losartan, methotrexate, and omeprazole.  Allergies:  Allergies as of 11/06/2020 - Reviewed 11/06/2020  Allergen Reaction Noted  . Leflunomide Headache 03/09/2020  . Sulfa (sulfonamide antibiotics) Itching and Rash 01/08/2014  . Sulfasalazine Itching 12/12/2016    ROS:  A 15 point review of systems was performed and was negative except as noted in HPI    Objective:     BP (!) 140/68   Pulse 88   Ht 172.7 cm (5' 8")   Wt 67.6 kg (149 lb)   BMI 22.66 kg/m   Constitutional :  alert, appears stated age, cooperative and no distress  Lymphatics/Throat:  no asymmetry, masses, or scars  Respiratory:  clear to auscultation bilaterally  Cardiovascular:  regular rate and rhythm  Gastrointestinal: soft, non-tender; bowel sounds normal; no masses,  no organomegaly.   Musculoskeletal: Steady gait and movement  Skin: Cool and moist,   Psychiatric: Normal affect, non-agitated, not confused  Breast:  Chaperone present for exam.  breasts appear normal, no suspicious masses, no skin or nipple changes or axillary nodes.    LABS:  n/a   RADS: CLINICAL DATA: 79-year-old with a personal history of BILATERAL  malignant lumpectomies in October, 2019. As the LEFT breast  malignancy was only identified on MRI, follow-up examination is  performed.   LABS: Not applicable.   EXAM:  BILATERAL BREAST MRI WITH AND WITHOUT CONTRAST   TECHNIQUE:  Multiplanar, multisequence MR images of both breasts were obtained  prior to and following the intravenous administration of 6 ml of  Gadavist.   Three-dimensional MR images were rendered by post-processingof the  original MR data on an independent workstation. The  three-dimensional MR images were interpreted, and findings are  reported in the following complete MRI report for this study. Three  dimensional images were evaluated at the independent interpreting  workstation using the DynaCAD thin client.   COMPARISON: Previous exams, most recently mammography 04/17/2020  and MRI 06/21/2019.   FINDINGS:  Breast composition: b. Scattered fibroglandular tissue.   Background parenchymal enhancement: Minimal.   Right breast: No suspicious mass or abnormal enhancement. Expected  post lumpectomy changes. Blooming   artifact related to the  dumbbell-shaped tissue marker clip in the UPPER OUTER QUADRANT at   the site of prior benign biopsy.   Left breast: Newstippled linear non-mass enhancement involving the  LOWER INNER QUADRANT at MIDDLE to POSTERIOR depth, extending from  the prior lumpectomy site anteriorly, spanning approximately 2.5 cm,  demonstrating washout kinetics.   Approximate 0.7 cm mass in the LOWER OUTER QUADRANT at POSTERIOR  depth with central fat, not conspicuous on the prior examinations,  demonstrating plateau kinetics.   Lymph nodes: No pathologic lymphadenopathy.   Ancillary findings: Indeterminate enhancement involving the LATERAL  aspect of the RIGHT MIDDLE LOBE measuring approximately 0.9 cm.   IMPRESSION:  1. Indeterminate stippled linear non-mass enhancement involving the  LOWER INNER QUADRANT of the LEFT breast, extending from the prior  lumpectomy site anteriorly, spanning2.5 cm.  2. Indeterminate 0.7 cm mass involving the LOWER OUTER QUADRANT of  the LEFT breast at POSTERIOR depth.  3. Indeterminate 0.9 cm masslike enhancement involving the RIGHT  MIDDLE LOBE laterally.  4. No pathologic lymphadenopathy.  5. No MRI evidence of malignancy involving the RIGHT breast   RECOMMENDATION:  1. MRI guided biopsy of the non-mass enhancement in the LOWER INNER  QUADRANT and the mass in the LOWER OUTER QUADRANT of the LEFT  breast.  2. CT of the chest with contrast to further evaluate the enhancement  in the RIGHT MIDDLE LOBE.   BI-RADS CATEGORY 4: Suspicious.    CLINICAL DATA: 80 year old female presenting for MRI guided biopsy  of 2 lesions in the left breast. Unfortunately, one site became  inaccessible after some slight movement during the procedure, and  only one biopsy was performed today.   EXAM:  MRI GUIDED CORE NEEDLE BIOPSY OF THE LEFT BREAST   TECHNIQUE:  Multiplanar, multisequence MR imaging of the left breast was  performed both before and after administration of intravenous  contrast.   CONTRAST: 29m GADAVIST GADOBUTROL 1 MMOL/ML IV  SOLN   COMPARISON: Previous exams.   FINDINGS:  I met with the patient, and we discussed the procedure of MRI guided  biopsy, including risks, benefits, and alternatives. Specifically,  we discussed the risks of infection, bleeding, tissue injury, clip  migration, and inadequate sampling. Informed, written consent was  given. The usual time out protocol was performed immediately prior  to the procedure.   Using sterile technique, 1% Lidocaine, MRI guidance, and a 9 gauge  vacuum assisted device, biopsy was performed of linear non mass  enhancement in the lower inner middle depth of the left breast using  a lateral approach. At the conclusion of the procedure, a  dumbbell-shaped tissue marker clip was deployed into the biopsy  cavity.   There was mild movement by the patient during numbing with lidocaine  for the first lesion. Due to the far inferior and posterior location  of the second lesion, this was not accessible after needle placement  for the first lesion. Therefore, this biopsy was not performed  today.   Follow-up 2-view mammogram was performed and dictated separately.   IMPRESSION:  1. MRI guided biopsy of linear non mass enhancement in the lower  inner posterior left breast. No apparent complications.   2. The MRI biopsy for the second lesion was unable to be performed  today.   Electronically Signed:  By: MAmmie FerrierM.D.  On: 10/30/2020 09:53  ADDENDUM: Pathology revealed HIGH GRADE FOCAL DUCTAL CARCINOMA IN SITU WITH NECROSIS, FAT NECROSIS, FIBROCYSTIC CHANGE of the LEFT breast, lower inner  quadrant. This was found to be concordant by Dr. Michelle Collins.  Pathology results were discussed with the patient by telephone. The patient reported doing well after the biopsy with tenderness at the site. Post biopsy instructions and care were reviewed and questions were answered. The patient was encouraged to call [facility]  for any additional concerns.  Results and recommendations forwarded to Dr. Zhou Yu of [facility] and Dr. Isami Sakai of [facility] and Anne Shaver RN Oncology Nurse Navigator of [facility] via Epic messaging. Anne Shaver RN will attempt to reach patient today regarding surgical appointment.  There was mild movement by the patient during numbing with lidocaine for the first lesion. Due to the far inferior and posterior location of the second lesion, this was not accessible after needle placement for the first lesion. Therefore, this biopsy was not performed. We can reschedule her for a second attempt at biopsy of the second lesion if this will alter management  Pathology results reported by Susan Eaton RN on 10/31/2020.   Electronically Signed   By: Michelle  Collins M.D.   On: 10/31/2020 15:13  Assessment:   Ductal carcinoma in situ (DCIS) of left breast [D05.12]  Recurrent, biopsy proven with one area suspicious but unable to biopsy located in separate quadrant.  Due to previous surgery and limited tissue remaining, along with recurrence, recommended proceeding with left mastectomy  Plan:     1. Ductal carcinoma in situ (DCIS) of left breast [D05.12]  Discussed the risk of surgery including recurrence, chronic pain, post-op infxn, poor/delayed wound healing, poor cosmesis, seroma, hematoma formation, and possible re-operation to address said risks. The risks of general anesthetic, if used, includes MI, CVA, sudden death or even reaction to anesthetic medications also discussed.  Typical post-op recovery time and possbility of activity restrictions were also discussed.  Alternatives include continued observation.  Benefits include possible symptom relief, pathologic evaluation, and/or curative excision.   The patient verbalized understanding and all questions were answered to the patient's satisfaction.  2. Patient has elected to proceed  with surgical treatment. Procedure will be scheduled.  Left mastectomy with repeat SLNB, due to high grade DCIS.  Written consent was obtained.   

## 2020-11-08 ENCOUNTER — Other Ambulatory Visit: Payer: Self-pay | Admitting: Surgery

## 2020-11-08 DIAGNOSIS — C50312 Malignant neoplasm of lower-inner quadrant of left female breast: Secondary | ICD-10-CM

## 2020-11-09 ENCOUNTER — Other Ambulatory Visit: Payer: Self-pay | Admitting: Oncology

## 2020-11-17 DIAGNOSIS — K529 Noninfective gastroenteritis and colitis, unspecified: Secondary | ICD-10-CM | POA: Diagnosis not present

## 2020-11-17 DIAGNOSIS — E034 Atrophy of thyroid (acquired): Secondary | ICD-10-CM | POA: Diagnosis not present

## 2020-11-17 DIAGNOSIS — E119 Type 2 diabetes mellitus without complications: Secondary | ICD-10-CM | POA: Diagnosis not present

## 2020-11-17 DIAGNOSIS — Z03818 Encounter for observation for suspected exposure to other biological agents ruled out: Secondary | ICD-10-CM | POA: Diagnosis not present

## 2020-11-17 DIAGNOSIS — R531 Weakness: Secondary | ICD-10-CM | POA: Diagnosis not present

## 2020-11-17 DIAGNOSIS — J449 Chronic obstructive pulmonary disease, unspecified: Secondary | ICD-10-CM | POA: Diagnosis not present

## 2020-11-20 DIAGNOSIS — M0579 Rheumatoid arthritis with rheumatoid factor of multiple sites without organ or systems involvement: Secondary | ICD-10-CM | POA: Diagnosis not present

## 2020-11-20 DIAGNOSIS — Z79899 Other long term (current) drug therapy: Secondary | ICD-10-CM | POA: Diagnosis not present

## 2020-11-20 DIAGNOSIS — M79644 Pain in right finger(s): Secondary | ICD-10-CM | POA: Diagnosis not present

## 2020-11-20 DIAGNOSIS — J449 Chronic obstructive pulmonary disease, unspecified: Secondary | ICD-10-CM | POA: Diagnosis not present

## 2020-11-20 DIAGNOSIS — C50512 Malignant neoplasm of lower-outer quadrant of left female breast: Secondary | ICD-10-CM | POA: Diagnosis not present

## 2020-11-27 ENCOUNTER — Other Ambulatory Visit
Admission: RE | Admit: 2020-11-27 | Discharge: 2020-11-27 | Disposition: A | Discharge status: Home / Self Care | Payer: PPO | Source: Ambulatory Visit | Attending: Surgery | Admitting: Surgery

## 2020-11-27 ENCOUNTER — Encounter: Payer: Self-pay | Admitting: Surgery

## 2020-11-27 ENCOUNTER — Other Ambulatory Visit: Payer: Self-pay

## 2020-11-27 HISTORY — DX: Essential (primary) hypertension: I10

## 2020-11-27 HISTORY — DX: Pneumonia, unspecified organism: J18.9

## 2020-11-27 NOTE — Progress Notes (Signed)
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 Clinical nursing staff has updated patient's PMH/PSHx, current medication list, and drug allergies/intolerances to ensure comprehensive history available to assist in medical decision making as it pertains to the aforementioned surgical procedure and anticipated anesthetic course.   Clinical Discussion:  Amy Woodard is a 80 y.o. female who is submitted for pre-surgical anesthesia review and clearance prior to her undergoing the above procedure. Patient is a Former Smoker (quit 03/1993). Pertinent PMH includes: aortic atherosclerosis, coronary atherosclerosis, severe COPD (requires supplemental oxygen), HTN, HLD, T2DM, hypothyroidism, GERD (uses CaCO3 PRN), breast cancer, rheumatoid arthritis.   Patient is followed by pulmonary medicine Raul Del, MD). She was last seen in the pulmonology clinic on 11/07/2020; notes reviewed.  Patient with diagnosis of COPD.  Patient with chronic dyspnea requiring supplemental oxygen (2L/[place]) which she uses on a as needed basis.  Last pulmonary function testing revealed spirometry consistent with severe COPD; FEV1 0.57 and a severely decreased DLCO (30% predicted).  Respiratory status reported to be at baseline.  Patient using respiratory medications as prescribed by pulmonary medicine.  Patient felt like, that was helping.  Flovent MDI added.  Supplemental oxygen was increased to 3L/[place] as needed while ambulatory.  No other changes were  made to patient's medication regimen during this visit.  Patient to follow-up with pulmonary medicine clinic in 5 months or sooner if needed.  Patient with multiple comorbid medical conditions. She is followed by Edwina Barth, MD (internal medicine).  Last PCP visit was reviewed.  Patient noted to be on GDMT for her HTN and HLD diagnoses.  Blood pressure well controlled at 132/74 on currently prescribed ARB therapy.  Patient is not currently on any prescribed lipid-lowering regimen; controlled with diet lifestyle modification.  Hypothyroidism stable; TSH normal.  T2DM well-controlled on currently prescribed regimen; last hemoglobin A1c was 6.2% on 10/24/2020.  No changes were made to patient's medication regimen.  Patient to follow-up with internal medicine at regular defined intervals for ongoing management of the aforementioned conditions.  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  Patient is currently being followed in the [facility] by Dr. Earlie Server. Of note, patient has undergone lumpectomy in the past, however per recommendations from general surgery Lysle Pearl, DO) mastectomy has been recommended.  Given patient's history of severe pulmonary disease, presurgical pulmonary medicine clearance was sought by the performing surgeon's office and PAT team.  Per pulmonology, "patient's lung function is poor.  She is at [place] for prolonged vent placement.  However, due to the ominous nature of the diagnosis, patient is willing to go ahead with the surgery.  Patient is cleared to proceed with planned mastectomy".  This patient is not on any type of anticoagulation/antiplatelet therapies.  Patient denies previous perioperative complications with anesthesia in the past. In review of the available records, it is noted that patient underwent a general anesthetic course near (ASA III) in 07/2018 without documented complications.   Vitals with  BMI  11/01/2020 04/20/2020 12/31/2019  Height - - -  Weight 145 lbs 10 oz 151 lbs 13 oz 159 lbs 8 oz  BMI - - -  Systolic [phone]  Diastolic 73 59 56  Pulse 82 81 85    Providers/Specialists:   NOTE: Primary physician provider listed below. Patient may have been seen by APP or partner within same practice.   PROVIDER ROLE / SPECIALTY LAST Shirline Frees, DO General Surgery  11/07/2020  Baxter Hire, MD Primary Care Provider  11/17/2020  Wallene Huh, MD Pulmonary Medicine  11/07/2020  Earlie Server, MD Oncology  11/01/2020  Percell Boston, MD Rheumatology  11/20/2020   Allergies:  Leflunomide and Sulfa antibiotics  Current Home Medications:   No current facility-administered medications for this encounter.   [name] anastrozole (ARIMIDEX) 1 MG tablet  . Cholecalciferol (VITAMIN D3) 50 MCG (2000 UT) capsule  . fluticasone (FLONASE) 50 MCG/ACT nasal spray  . folic acid (FOLVITE) 1 MG tablet  . glipiZIDE (GLUCOTROL XL) 5 MG 24 hr tablet  . hydroxychloroquine (PLAQUENIL) 200 MG tablet  . Ipratropium-Albuterol (COMBIVENT) 20-100 MCG/ACT AERS respimat  . irbesartan (AVAPRO) 75 MG tablet  . levothyroxine (SYNTHROID, LEVOTHROID) 50 MCG tablet  . azelastine (ASTELIN) 0.1 % nasal spray  . budesonide-formoterol (SYMBICORT) 160-4.5 MCG/ACT inhaler  . Calcium Carbonate-Vit D-Min (CALCIUM 1200) 1200-1000 MG-UNIT CHEW  . losartan (COZAAR) 25 MG tablet  . methotrexate (RHEUMATREX) 2.5 MG tablet  . omeprazole (PRILOSEC) 20 MG capsule   History:   Past Medical History:  Diagnosis Date  . Allergy   . Aortic atherosclerosis ([place])   . Breast cancer ([place]) 05/2018   Right breast found 1st, then left breast with ductal ca  . Cellulitis of breast 05/2018   right breast cellulitis after first biopsy,  antibiotics prescribed  . COPD (chronic obstructive pulmonary disease) ([place])   . Coronary atherosclerosis   . Diabetes mellitus without complication ([place])   . Dyspnea   . GERD  (gastroesophageal reflux disease)    occasionally  . History of kidney stones   . HLD (hyperlipidemia)   . Hypertension   . Hypothyroidism   . Oxygen dependent    2liter nasal prong continuously  . Pneumonia   . Rheumatoid arthritis ([place])   . RLS (restless legs syndrome)    Past Surgical History:  Procedure Laterality Date  . APPENDECTOMY    . BACK SURGERY     lumbar. herniated disc . no metal  . BREAST BIOPSY Right 03/19/2018   INVASIVE MAMMARY CARCINOMA, NO SPECIAL TYPE  . BREAST BIOPSY Left 06/01/2018   MRI bx, grade III invasive ductal carcinoma  . BREAST BIOPSY Right 06/01/2018   MRI bx of enhancing mass, path showed FOREIGN BODY GRANULOMATOUS RESPONSE   . 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 BREAST LUMPECTOMY Right 07/03/2018   Procedure: RE-EXCISION OF RIGHT BREAST TISSUE MASS;  Surgeon: Benjamine Sprague, DO;  Location: [facility] ORS;  Service: General;  Laterality: Right;  . BREAST LUMPECTOMY Left 06/2018   invasive ductal  . IRRIGATION AND DEBRIDEMENT HEMATOMA Left 07/10/2018   Procedure: IRRIGATION AND DEBRIDEMENT HEMATOMA LEFT AXILLARY;  Surgeon: Benjamine Sprague, DO;  Location: [facility] ORS;  Service: General;  Laterality: Left;  . PARTIAL MASTECTOMY WITH NEEDLE LOCALIZATION AND AXILLARY SENTINEL LYMPH NODE BX Right 04/16/2018   Procedure: PARTIAL MASTECTOMY WITH NEEDLE LOCALIZATION AND AXILLARY SENTINEL LYMPH NODE BX;  Surgeon: Benjamine Sprague, DO;  Location: [facility] ORS;  Service: General;  Laterality: Right;  . PARTIAL MASTECTOMY WITH NEEDLE LOCALIZATION AND AXILLARY SENTINEL LYMPH NODE BX Left 07/03/2018   Procedure: PARTIAL MASTECTOMY WITH NEEDLE LOCALIZATION AND SENTINEL LYMPH NODE BX;  Surgeon: Benjamine Sprague, DO;  Location: [facility] ORS;  Service: General;  Laterality: Left;   Family History  Problem Relation Age of Onset  . Clotting disorder Mother   . Melanoma Mother        deceased 57  . Lung cancer Sister        deceased 43s; smoker  . Alcohol abuse Father         deceased 52  . Cervical cancer Other        neice  . Breast cancer Neg Hx    Social History   Tobacco Use  . Smoking status: Former Smoker    Types: Cigarettes    Quit date: 03/30/1993    Years since quitting: 27.6  . Smokeless tobacco: Never Used  Vaping Use  . Vaping Use: Never used  Substance Use Topics  . Alcohol use: No  . Drug use: Never    Pertinent Clinical Results:  LABS: Labs reviewed: Acceptable for surgery.  No visits with results within 3 Day(s) from this visit.  Latest known visit with results is:  Appointment on 11/01/2020  Component Date Value Ref Range Status  . Sodium 11/01/2020 140  135 - 145 mmol/L Final  . Potassium 11/01/2020 4.3  3.5 - 5.1 mmol/L Final  . Chloride 11/01/2020 98  98 - 111 mmol/L Final  . CO2 11/01/2020 32  22 - 32 mmol/L Final  . Glucose, Bld 11/01/2020 132* 70 - 99 mg/dL Final   Glucose reference range applies only to samples taken after fasting for at least 8 hours.  . BUN 11/01/2020 23  8 - 23 mg/dL Final  . Creatinine, Ser 11/01/2020 0.89  0.44 - 1.00 mg/dL Final  . Calcium 11/01/2020 9.4  8.9 - 10.3 mg/dL Final  . Total Protein 11/01/2020 7.3  6.5 - 8.1 g/dL Final  . Albumin 11/01/2020 4.3  3.5 - 5.0 g/dL Final  . AST 11/01/2020 20  15 - 41 U/L Final  . ALT 11/01/2020 20  0 - 44 U/L Final  . Alkaline Phosphatase 11/01/2020 47  38 - 126 U/L Final  . Total Bilirubin 11/01/2020 0.4  0.3 - 1.2 mg/dL Final  . GFR, Estimated 11/01/2020 >60  >60 mL/min Final   Comment: (NOTE) Calculated using the CKD-EPI Creatinine Equation (2021)   . Anion gap 11/01/2020 10  5 - 15 Final   Performed at [facility], [address] [place], [place] [address]  . WBC 11/01/2020 7.9  4.0 - 10.5 K/uL Final  . RBC 11/01/2020 4.61  3.87 - 5.11 MIL/uL Final  . Hemoglobin 11/01/2020 13.1  12.0 - 15.0 g/dL Final  . HCT 11/01/2020 41.3  36.0 - 46.0 % Final  . MCV 11/01/2020 89.6  80.0 - 100.0 fL Final  . MCH 11/01/2020 28.4  26.0 - 34.0 pg  Final  . MCHC 11/01/2020 31.7  30.0 - 36.0 g/dL Final  . RDW 11/01/2020 13.8  11.5 - 15.5 % Final  . Platelets 11/01/2020 306  150 - 400 K/uL Final  . nRBC 11/01/2020 0.0  0.0 - 0.2 % Final  . Neutrophils Relative % 11/01/2020 63  % Final  . Neutro Abs 11/01/2020 5.0  1.7 - 7.7 K/uL Final  . Lymphocytes Relative 11/01/2020 27  % Final  . Lymphs Abs 11/01/2020 2.1  0.7 -  4.0 K/uL Final  . Monocytes Relative 11/01/2020 4  % Final  . Monocytes Absolute 11/01/2020 0.3  0.1 - 1.0 K/uL Final  . Eosinophils Relative 11/01/2020 5  % Final  . Eosinophils Absolute 11/01/2020 0.4  0.0 - 0.5 K/uL Final  . Basophils Relative 11/01/2020 1  % Final  . Basophils Absolute 11/01/2020 0.1  0.0 - 0.1 K/uL Final  . Immature Granulocytes 11/01/2020 0  % Final  . Abs Immature Granulocytes 11/01/2020 0.02  0.00 - 0.07 K/uL Final   Performed at [facility], [address] [place], [place] [address]  . Vit D, 25-Hydroxy 11/01/2020 43.78  30 - 100 ng/mL Final   Comment: (NOTE) Vitamin D deficiency has been defined by the [place] practice guideline as a level of serum 25-OH  vitamin D less than 20 ng/mL (1,2). The Endocrine Society went on to  further define vitamin D insufficiency as a level between 21 and 29  ng/mL (2).  1. IOM (Institute of Medicine). 2010. Dietary reference intakes for  calcium and D. [place]: The [employer]. 2. Holick MF, Binkley Goldfield, Bischoff-Ferrari HA, et al. Evaluation,  treatment, and prevention of vitamin D deficiency: an Endocrine  Society clinical practice guideline, JCEM. 2011 Jul; 96(7): 1911-30.  Performed at [facility], [place] [address], [place],  [address]     ECG: Date: 11/29/2020 Time ECG obtained: 0900 AM Rate: 81 bpm Rhythm: normal sinus Axis (leads I and aVF): Normal Intervals: PR 158 ms. QRS 90 ms. QTc 418 ms. ST segment and T wave changes: No evidence of acute ST segment elevation  or depression Comparison: Similar to previous tracing obtained on 04/09/2018   IMAGING / PROCEDURES: CT CHEST WITHOUT CONTRAST performed on 11/07/2020 1. Aortic atherosclerosis 2. Coronary atherosclerosis 3. No masses or pathologically enlarged lymph nodes identified on unenhanced exam 4. Moderate central lobar pneumonia 5. Multiple tiny calcified granulomas are noted in the right upper lobe 6. Mild scarring is seen in the right upper and middle lobes, and inferior lingula.   7. No suspicious nodules or masses identified.   8. No suspicious bone lesions.   9. No evidence of metastatic disease within the thorax  PULMONARY FUNCTION TESTING performed on 04/25/2020 1. Spirometry is consistent with very severe COPD  FVC was 1.31 liters, 47% of predicted  FEV1 was 0.57, 29% of predicted  FEV1 ratio was 44  FEF 25-75% liters per second was 11% of predicted 2. Lung volumes are within low normal range  TLC was 80% of predicted  RV was 123% of predicted 3. DLCO is severely decreased; DLCO/VA is within super normal range  DLCO was 30% of predicted  DLCO/VA was 111% of predicted 4. Flow volume loop  Severely delayed (exp flow vol loop)  Impression and Plan:  [name] has been referred for pre-anesthesia review and clearance prior to her undergoing the planned anesthetic and procedural courses. Available labs, pertinent testing, and imaging results were personally reviewed by me. This patient has been appropriately cleared by pulmonary medicine with an overall HIGH RISK for the potential of significant perioperative pulmonary complications.  Based on clinical review performed today (11/29/20), barring any significant acute changes in the patient's overall condition, it is anticipated that she will be able to proceed with the planned surgical intervention. Any acute changes in clinical condition may necessitate her procedure being postponed and/or cancelled. Pre-surgical  instructions were reviewed with the patient during  her PAT appointment and questions were fielded by PAT clinical staff.  Honor Loh, MSN, APRN, FNP-C, CEN [facility]  Peri-operative Services Nurse Practitioner Phone: [phone] 11/29/20 10:12 AM  NOTE: This note has been prepared using Dragon dictation software. Despite my best ability to proofread, there is always the potential that unintentional transcriptional errors may still occur from this process.

## 2020-11-27 NOTE — Patient Instructions (Addendum)
Your procedure is scheduled on: 12/01/20- Friday Report to the Registration Desk on the 1st floor of the [facility]. To find out your arrival time, please call [phone] between 1PM - 3PM on: 11/30/64- Thursday REPORT TO MEDICAL ARTS FOR EKG AND COVID TESTING ON 11/29/20 .   REMEMBER: Instructions that are not followed completely may result in serious medical risk, up to and including death; or upon the discretion of your surgeon and anesthesiologist your surgery may need to be rescheduled.  Do not eat food after midnight the night before surgery.  No gum chewing, lozengers or hard candies.  You may however, drink CLEAR liquids up to 2 hours before you are scheduled to arrive for your surgery. Do not drink anything within 2 hours of your scheduled arrival time. Type 1 and Type 2 diabetics should only drink water.  TAKE THESE MEDICATIONS THE MORNING OF SURGERY WITH A SIP OF WATER: - anastrozole (ARIMIDEX) 1 MG tablet - hydroxychloroquine (PLAQUENIL) 200 MG tablet - Ipratropium-Albuterol (COMBIVENT) 20-100 MCG/ACT AERS respimat - levothyroxine (SYNTHROID, LEVOTHROID) 50 MCG tablet - omeprazole (PRILOSEC) 20 MG capsule, take one the night before and one on the morning of surgery - helps to prevent nausea after surgery.)  Use inhaler budesonide-formoterol (SYMBICORT) 160-4.5 MCG/ACT  on the day of surgery.  One week prior to surgery: Stop Anti-inflammatories (NSAIDS) such as Advil, Aleve, Ibuprofen, Motrin, Naproxen, Naprosyn and Aspirin based products such as Excedrin, Goodys Powder, BC Powder.  Stop ANY OVER THE COUNTER supplements until after surgery.  No Alcohol for 24 hours before or after surgery.  No Smoking including e-cigarettes for 24 hours prior to surgery.  No chewable tobacco products for at least 6 hours prior to surgery.  No nicotine patches on the day of surgery.  Do not use any "recreational" drugs for at least a week prior to your surgery.  Please be advised  that the combination of cocaine and anesthesia may have negative outcomes, up to and including death. If you test positive for cocaine, your surgery will be cancelled.  On the morning of surgery brush your teeth with toothpaste and water, you may rinse your mouth with mouthwash if you wish. Do not swallow any toothpaste or mouthwash.  Do not wear jewelry, make-up, hairpins, clips or nail polish.  Do not wear lotions, powders, or perfumes.   Do not shave body from the neck down 48 hours prior to surgery just in case you cut yourself which could leave a site for infection.  Also, freshly shaved skin may become irritated if using the CHG soap.  Contact lenses, hearing aids and dentures may not be worn into surgery.  Do not bring valuables to the hospital. [facility] is not responsible for any missing/lost belongings or valuables.   Use CHG Soap or wipes as directed on instruction sheet.  Notify your doctor if there is any change in your medical condition (cold, fever, infection).  Wear comfortable clothing (specific to your surgery type) to the hospital.  Plan for stool softeners for home use; pain medications have a tendency to cause constipation. You can also help prevent constipation by eating foods high in fiber such as fruits and vegetables and drinking plenty of fluids as your diet allows.  After surgery, you can help prevent lung complications by doing breathing exercises.  Take deep breaths and cough every 1-2 hours. Your doctor may order a device called an Incentive Spirometer to help you take deep breaths. When coughing or sneezing, hold a  pillow firmly against your incision with both hands. This is called "splinting." Doing this helps protect your incision. It also decreases belly discomfort.  If you are being admitted to the hospital overnight, leave your suitcase in the car. After surgery it may be brought to your room.  If you are being discharged the day of surgery, you  will not be allowed to drive home. You will need a responsible adult (18 years or older) to drive you home and stay with you that night.   If you are taking public transportation, you will need to have a responsible adult (18 years or older) with you. Please confirm with your physician that it is acceptable to use public transportation.   Please call the [place] Dept. at [phone] if you have any questions about these instructions.  Surgery Visitation Policy:  Patients undergoing a surgery or procedure may have one family member or support person with them as long as that person is not COVID-19 positive or experiencing its symptoms.  That person may remain in the waiting area during the procedure.  Inpatient Visitation:    Visiting hours are 7 a.m. to 8 p.m. Inpatients will be allowed two visitors daily. The visitors may change each day during the patient's stay. No visitors under the age of 50. Any visitor under the age of 79 must be accompanied by an adult. The visitor must pass COVID-19 screenings, use hand sanitizer when entering and exiting the patient's room and wear a mask at all times, including in the patient's room. Patients must also wear a mask when staff or their visitor are in the room. Masking is required regardless of vaccination status.

## 2020-11-29 ENCOUNTER — Other Ambulatory Visit: Payer: Self-pay

## 2020-11-29 ENCOUNTER — Other Ambulatory Visit
Admission: RE | Admit: 2020-11-29 | Discharge: 2020-11-29 | Disposition: A | Discharge status: Home / Self Care | Payer: PPO | Source: Ambulatory Visit | Attending: Surgery | Admitting: Surgery

## 2020-11-29 DIAGNOSIS — Z01812 Encounter for preprocedural laboratory examination: Secondary | ICD-10-CM | POA: Insufficient documentation

## 2020-11-29 DIAGNOSIS — Z20822 Contact with and (suspected) exposure to covid-19: Secondary | ICD-10-CM | POA: Diagnosis not present

## 2020-11-29 LAB — SARS CORONAVIRUS 2 (TAT 6-24 HRS): SARS Coronavirus 2: NEGATIVE

## 2020-12-01 ENCOUNTER — Encounter
Admission: RE | Disposition: A | Discharge status: Home / Self Care | Payer: Self-pay | Source: Home / Self Care | Attending: Surgery

## 2020-12-01 ENCOUNTER — Ambulatory Visit: Payer: PPO | Admitting: Urgent Care

## 2020-12-01 ENCOUNTER — Encounter: Payer: Self-pay | Admitting: Surgery

## 2020-12-01 ENCOUNTER — Ambulatory Visit
Admission: RE | Admit: 2020-12-01 | Discharge: 2020-12-01 | Disposition: A | Discharge status: Home / Self Care | Payer: PPO | Source: Ambulatory Visit | Attending: Surgery | Admitting: Surgery

## 2020-12-01 ENCOUNTER — Other Ambulatory Visit: Payer: Self-pay

## 2020-12-01 ENCOUNTER — Observation Stay
Admission: RE | Admit: 2020-12-01 | Discharge: 2020-12-02 | Disposition: A | Discharge status: Home / Self Care | Payer: PPO | Attending: Surgery | Admitting: Surgery

## 2020-12-01 DIAGNOSIS — E119 Type 2 diabetes mellitus without complications: Secondary | ICD-10-CM | POA: Diagnosis not present

## 2020-12-01 DIAGNOSIS — J449 Chronic obstructive pulmonary disease, unspecified: Secondary | ICD-10-CM | POA: Diagnosis not present

## 2020-12-01 DIAGNOSIS — D0512 Intraductal carcinoma in situ of left breast: Principal | ICD-10-CM | POA: Insufficient documentation

## 2020-12-01 DIAGNOSIS — C50312 Malignant neoplasm of lower-inner quadrant of left female breast: Secondary | ICD-10-CM

## 2020-12-01 DIAGNOSIS — I10 Essential (primary) hypertension: Secondary | ICD-10-CM | POA: Diagnosis not present

## 2020-12-01 DIAGNOSIS — E7800 Pure hypercholesterolemia, unspecified: Secondary | ICD-10-CM | POA: Diagnosis not present

## 2020-12-01 DIAGNOSIS — Z79899 Other long term (current) drug therapy: Secondary | ICD-10-CM | POA: Diagnosis not present

## 2020-12-01 DIAGNOSIS — J189 Pneumonia, unspecified organism: Secondary | ICD-10-CM | POA: Diagnosis not present

## 2020-12-01 DIAGNOSIS — J440 Chronic obstructive pulmonary disease with acute lower respiratory infection: Secondary | ICD-10-CM | POA: Diagnosis not present

## 2020-12-01 DIAGNOSIS — Z87891 Personal history of nicotine dependence: Secondary | ICD-10-CM | POA: Insufficient documentation

## 2020-12-01 DIAGNOSIS — D0590 Unspecified type of carcinoma in situ of unspecified breast: Secondary | ICD-10-CM | POA: Diagnosis present

## 2020-12-01 HISTORY — DX: Rheumatoid arthritis, unspecified: M06.9

## 2020-12-01 HISTORY — DX: Atherosclerosis of aorta: I70.0

## 2020-12-01 HISTORY — DX: Atherosclerotic heart disease of native coronary artery without angina pectoris: I25.10

## 2020-12-01 HISTORY — DX: Hyperlipidemia, unspecified: E78.5

## 2020-12-01 HISTORY — DX: Restless legs syndrome: G25.81

## 2020-12-01 HISTORY — PX: TOTAL MASTECTOMY: SHX6129

## 2020-12-01 LAB — GLUCOSE, CAPILLARY
Glucose-Capillary: 116 mg/dL — ABNORMAL HIGH (ref 70–99)
Glucose-Capillary: 131 mg/dL — ABNORMAL HIGH (ref 70–99)
Glucose-Capillary: 162 mg/dL — ABNORMAL HIGH (ref 70–99)
Glucose-Capillary: 122 mg/dL — ABNORMAL HIGH (ref 70–99)

## 2020-12-01 LAB — CREATININE, SERUM
Creatinine, Ser: 0.91 mg/dL (ref 0.44–1.00)
GFR, Estimated: >60 mL/min (ref >60)

## 2020-12-01 LAB — CBC
HCT: 39.6 % (ref 36.0–46.0)
Hemoglobin: 12.3 g/dL (ref 12.0–15.0)
MCH: 28.4 pg (ref 26.0–34.0)
MCHC: 31.1 g/dL (ref 30.0–36.0)
MCV: 91.5 fL (ref 80.0–100.0)
Platelets: 269 10*3/uL (ref 150–400)
RBC: 4.33 MIL/uL (ref 3.87–5.11)
RDW: 14 % (ref 11.5–15.5)
WBC: 12.8 10*3/uL — ABNORMAL HIGH (ref 4.0–10.5)
nRBC: 0 % (ref 0.0–0.2)

## 2020-12-01 SURGERY — MASTECTOMY, SIMPLE
Anesthesia: General | Site: Breast | Laterality: Left

## 2020-12-01 MED ORDER — SODIUM CHLORIDE FLUSH 0.9 % IV SOLN
INTRAVENOUS | Status: AC
  Filled 2020-12-01: qty 60

## 2020-12-01 MED ORDER — CHLORHEXIDINE GLUCONATE 0.12 % MT SOLN: 15.0000 mL | Freq: Once | OROMUCOSAL | Status: AC

## 2020-12-01 MED ORDER — MORPHINE SULFATE (PF) 2 MG/ML IV SOLN: 1.0000 mg | INTRAVENOUS | Status: DC | PRN

## 2020-12-01 MED ORDER — LIDOCAINE HCL (CARDIAC) PF 100 MG/5ML IV SOSY
PREFILLED_SYRINGE | INTRAVENOUS | Status: DC | PRN
  Administered 2020-12-01: 80 mg via INTRAVENOUS

## 2020-12-01 MED ORDER — CHLORHEXIDINE GLUCONATE CLOTH 2 % EX PADS: 6.0000 | MEDICATED_PAD | Freq: Once | CUTANEOUS | Status: DC

## 2020-12-01 MED ORDER — ACETAMINOPHEN 325 MG PO TABS: 650.0000 mg | ORAL_TABLET | Freq: Four times a day (QID) | ORAL | Status: DC | PRN

## 2020-12-01 MED ORDER — BUPIVACAINE HCL 0.5 % IJ SOLN: INTRAMUSCULAR | Status: DC | PRN

## 2020-12-01 MED ORDER — FENTANYL CITRATE (PF) 100 MCG/2ML IJ SOLN
INTRAMUSCULAR | Status: DC | PRN
  Administered 2020-12-01 (×2): 25 ug via INTRAVENOUS

## 2020-12-01 MED ORDER — SODIUM CHLORIDE 0.9 % IV SOLN
INTRAVENOUS | Status: DC | PRN
  Administered 2020-12-01: 40 mL

## 2020-12-01 MED ORDER — SODIUM CHLORIDE 0.9 % IV SOLN: INTRAVENOUS | Status: DC

## 2020-12-01 MED ORDER — TECHNETIUM TC 99M TILMANOCEPT KIT
1.0000 | PACK | Freq: Once | INTRAVENOUS | Status: AC | PRN
  Administered 2020-12-01: 1.16 via INTRADERMAL

## 2020-12-01 MED ORDER — CEFAZOLIN SODIUM-DEXTROSE 2-4 GM/100ML-% IV SOLN
2.0000 g | INTRAVENOUS | Status: AC
  Administered 2020-12-01: 2 g via INTRAVENOUS

## 2020-12-01 MED ORDER — INSULIN ASPART 100 UNIT/ML ~~LOC~~ SOLN
0.0000 [IU] | Freq: Three times a day (TID) | SUBCUTANEOUS | Status: DC
  Administered 2020-12-01: 2 [IU] via SUBCUTANEOUS
  Filled 2020-12-01: qty 1

## 2020-12-01 MED ORDER — BUPIVACAINE LIPOSOME 1.3 % IJ SUSP
INTRAMUSCULAR | Status: AC
  Filled 2020-12-01: qty 20

## 2020-12-01 MED ORDER — IRBESARTAN 150 MG PO TABS
75.0000 mg | ORAL_TABLET | Freq: Every day | ORAL | Status: DC
  Administered 2020-12-01 – 2020-12-02 (×2): 75 mg via ORAL
  Filled 2020-12-01 (×2): qty 1

## 2020-12-01 MED ORDER — GABAPENTIN 300 MG PO CAPS
300.0000 mg | ORAL_CAPSULE | ORAL | Status: AC
  Administered 2020-12-01: 300 mg via ORAL

## 2020-12-01 MED ORDER — ORAL CARE MOUTH RINSE: 15.0000 mL | Freq: Once | OROMUCOSAL | Status: AC

## 2020-12-01 MED ORDER — OXYCODONE HCL 5 MG/5ML PO SOLN: 5.0000 mg | Freq: Once | ORAL | Status: DC | PRN

## 2020-12-01 MED ORDER — FLUTICASONE PROPIONATE 50 MCG/ACT NA SUSP
2.0000 | Freq: Every day | NASAL | Status: DC | PRN
  Filled 2020-12-01: qty 16

## 2020-12-01 MED ORDER — ONDANSETRON 4 MG PO TBDP: 4.0000 mg | ORAL_TABLET | Freq: Four times a day (QID) | ORAL | Status: DC | PRN

## 2020-12-01 MED ORDER — OXYCODONE HCL 5 MG PO TABS: 5.0000 mg | ORAL_TABLET | Freq: Once | ORAL | Status: DC | PRN

## 2020-12-01 MED ORDER — ANASTROZOLE 1 MG PO TABS
1.0000 mg | ORAL_TABLET | Freq: Every day | ORAL | Status: DC
Start: 2020-12-02 — End: 2020-12-02
  Administered 2020-12-02: 1 mg via ORAL
  Filled 2020-12-01: qty 1

## 2020-12-01 MED ORDER — IPRATROPIUM-ALBUTEROL 20-100 MCG/ACT IN AERS
1.0000 | INHALATION_SPRAY | Freq: Four times a day (QID) | RESPIRATORY_TRACT | Status: DC | PRN
  Filled 2020-12-01: qty 4

## 2020-12-01 MED ORDER — ONDANSETRON HCL 4 MG/2ML IJ SOLN
INTRAMUSCULAR | Status: DC | PRN
  Administered 2020-12-01: 4 mg via INTRAVENOUS

## 2020-12-01 MED ORDER — METHYLENE BLUE 0.5 % INJ SOLN
INTRAVENOUS | Status: AC
  Filled 2020-12-01: qty 10

## 2020-12-01 MED ORDER — METHYLENE BLUE 0.5 % INJ SOLN
INTRAVENOUS | Status: DC | PRN
  Administered 2020-12-01: 7 mL via INTRADERMAL

## 2020-12-01 MED ORDER — LEVOTHYROXINE SODIUM 50 MCG PO TABS
50.0000 ug | ORAL_TABLET | Freq: Every day | ORAL | Status: DC
  Administered 2020-12-02: 50 ug via ORAL
  Filled 2020-12-01: qty 1

## 2020-12-01 MED ORDER — CEFAZOLIN SODIUM-DEXTROSE 2-4 GM/100ML-% IV SOLN
INTRAVENOUS | Status: AC
  Filled 2020-12-01: qty 100

## 2020-12-01 MED ORDER — ACETAMINOPHEN 500 MG PO TABS
1000.0000 mg | ORAL_TABLET | ORAL | Status: AC
  Administered 2020-12-01: 1000 mg via ORAL

## 2020-12-01 MED ORDER — FENTANYL CITRATE (PF) 100 MCG/2ML IJ SOLN
INTRAMUSCULAR | Status: AC
  Administered 2020-12-01: 25 ug via INTRAVENOUS
  Filled 2020-12-01: qty 2

## 2020-12-01 MED ORDER — GABAPENTIN 300 MG PO CAPS
ORAL_CAPSULE | ORAL | Status: AC
  Filled 2020-12-01: qty 1

## 2020-12-01 MED ORDER — ONDANSETRON HCL 4 MG/2ML IJ SOLN: 4.0000 mg | Freq: Once | INTRAMUSCULAR | Status: DC | PRN

## 2020-12-01 MED ORDER — ENOXAPARIN SODIUM 40 MG/0.4ML ~~LOC~~ SOLN
40.0000 mg | SUBCUTANEOUS | Status: DC
  Filled 2020-12-01: qty 0.4

## 2020-12-01 MED ORDER — PROPOFOL 10 MG/ML IV BOLUS
INTRAVENOUS | Status: AC
  Filled 2020-12-01: qty 20

## 2020-12-01 MED ORDER — CHLORHEXIDINE GLUCONATE 0.12 % MT SOLN
OROMUCOSAL | Status: AC
  Administered 2020-12-01: 15 mL via OROMUCOSAL
  Filled 2020-12-01: qty 15

## 2020-12-01 MED ORDER — TRAMADOL HCL 50 MG PO TABS: 50.0000 mg | ORAL_TABLET | Freq: Four times a day (QID) | ORAL | Status: DC | PRN

## 2020-12-01 MED ORDER — OXYCODONE HCL 5 MG PO TABS
5.0000 mg | ORAL_TABLET | ORAL | Status: DC | PRN
  Administered 2020-12-02: 10 mg via ORAL
  Filled 2020-12-01: qty 2

## 2020-12-01 MED ORDER — GLIPIZIDE ER 5 MG PO TB24
5.0000 mg | ORAL_TABLET | Freq: Every day | ORAL | Status: DC
  Administered 2020-12-01 – 2020-12-02 (×2): 5 mg via ORAL
  Filled 2020-12-01 (×2): qty 1

## 2020-12-01 MED ORDER — FENTANYL CITRATE (PF) 100 MCG/2ML IJ SOLN
INTRAMUSCULAR | Status: AC
  Filled 2020-12-01: qty 2

## 2020-12-01 MED ORDER — BUPIVACAINE HCL (PF) 0.5 % IJ SOLN
INTRAMUSCULAR | Status: AC
  Filled 2020-12-01: qty 30

## 2020-12-01 MED ORDER — PROPOFOL 10 MG/ML IV BOLUS
INTRAVENOUS | Status: DC | PRN
  Administered 2020-12-01: 30 mg via INTRAVENOUS
  Administered 2020-12-01: 100 mg via INTRAVENOUS
  Administered 2020-12-01: 30 mg via INTRAVENOUS

## 2020-12-01 MED ORDER — FENTANYL CITRATE (PF) 100 MCG/2ML IJ SOLN
25.0000 ug | INTRAMUSCULAR | Status: DC | PRN
Start: 2020-12-01 — End: 2020-12-01
  Administered 2020-12-01: 25 ug via INTRAVENOUS

## 2020-12-01 MED ORDER — ACETAMINOPHEN 500 MG PO TABS
ORAL_TABLET | ORAL | Status: AC
  Filled 2020-12-01: qty 2

## 2020-12-01 MED ORDER — ONDANSETRON HCL 4 MG/2ML IJ SOLN: 4.0000 mg | Freq: Four times a day (QID) | INTRAMUSCULAR | Status: DC | PRN

## 2020-12-01 MED ORDER — NAPROXEN 500 MG PO TABS
500.0000 mg | ORAL_TABLET | Freq: Two times a day (BID) | ORAL | Status: DC | PRN
  Filled 2020-12-01: qty 1

## 2020-12-01 MED ORDER — BUPIVACAINE-EPINEPHRINE (PF) 0.5% -1:200000 IJ SOLN
INTRAMUSCULAR | Status: AC
  Filled 2020-12-01: qty 30

## 2020-12-01 MED ORDER — FENTANYL CITRATE (PF) 100 MCG/2ML IJ SOLN
INTRAMUSCULAR | Status: AC
  Administered 2020-12-01: 50 ug via INTRAVENOUS
  Filled 2020-12-01: qty 2

## 2020-12-01 MED ORDER — PHENYLEPHRINE HCL (PRESSORS) 10 MG/ML IV SOLN
INTRAVENOUS | Status: DC | PRN
  Administered 2020-12-01 (×2): 100 ug via INTRAVENOUS

## 2020-12-01 MED ORDER — BUPIVACAINE-EPINEPHRINE (PF) 0.5% -1:200000 IJ SOLN: INTRAMUSCULAR | Status: DC | PRN

## 2020-12-01 MED ORDER — DEXAMETHASONE SODIUM PHOSPHATE 10 MG/ML IJ SOLN
INTRAMUSCULAR | Status: DC | PRN
  Administered 2020-12-01: 5 mg via INTRAVENOUS

## 2020-12-01 SURGICAL SUPPLY — 62 items
APPLIER CLIP 11 MED OPEN (CLIP)
BINDER BREAST LRG (GAUZE/BANDAGES/DRESSINGS) IMPLANT
BINDER BREAST MEDIUM (GAUZE/BANDAGES/DRESSINGS) ×2 IMPLANT
BINDER BREAST XLRG (GAUZE/BANDAGES/DRESSINGS) IMPLANT
BINDER BREAST XXLRG (GAUZE/BANDAGES/DRESSINGS) IMPLANT
BLADE SURG 15 STRL LF DISP TIS (BLADE) ×1 IMPLANT
BLADE SURG 15 STRL SS (BLADE) ×1
BULB RESERV EVAC DRAIN JP 100C (MISCELLANEOUS) ×4 IMPLANT
CHLORAPREP W/TINT 26 (MISCELLANEOUS) ×2 IMPLANT
CLIP APPLIE 11 MED OPEN (CLIP) IMPLANT
CNTNR SPEC 2.5X3XGRAD LEK (MISCELLANEOUS)
CONT SPEC 4OZ STER OR WHT (MISCELLANEOUS)
CONTAINER SPEC 2.5X3XGRAD LEK (MISCELLANEOUS) IMPLANT
COVER WAND RF STERILE (DRAPES) ×2 IMPLANT
DERMABOND ADVANCED (GAUZE/BANDAGES/DRESSINGS) ×1
DERMABOND ADVANCED .7 DNX12 (GAUZE/BANDAGES/DRESSINGS) ×1 IMPLANT
DEVICE DSSCT PLSMBLD 3.0S LGHT (MISCELLANEOUS) ×1 IMPLANT
DEVICE DUBIN SPECIMEN MAMMOGRA (MISCELLANEOUS) ×2 IMPLANT
DRAIN CHANNEL JP 15F RND 16 (MISCELLANEOUS) ×4 IMPLANT
DRAPE 3/4 80X56 (DRAPES) ×2 IMPLANT
DRAPE LAPAROTOMY TRNSV 106X77 (MISCELLANEOUS) ×2 IMPLANT
DRSG GAUZE FLUFF 36X18 (GAUZE/BANDAGES/DRESSINGS) ×2 IMPLANT
DRSG OPSITE POSTOP 4X12 (GAUZE/BANDAGES/DRESSINGS) ×2 IMPLANT
DRSG OPSITE POSTOP 4X14 (GAUZE/BANDAGES/DRESSINGS) ×2 IMPLANT
ELECT REM PT RETURN 9FT ADLT (ELECTROSURGICAL) ×2
ELECTRODE REM PT RTRN 9FT ADLT (ELECTROSURGICAL) ×1 IMPLANT
GAUZE SPONGE 4X4 12PLY STRL (GAUZE/BANDAGES/DRESSINGS) IMPLANT
GLOVE SURG SYN 6.5 ES PF (GLOVE) ×2 IMPLANT
GLOVE SURG UNDER POLY LF SZ7 (GLOVE) ×2 IMPLANT
GOWN STRL REUS W/ TWL LRG LVL3 (GOWN DISPOSABLE) ×2 IMPLANT
GOWN STRL REUS W/TWL LRG LVL3 (GOWN DISPOSABLE) ×2
KIT TURNOVER KIT A (KITS) ×2 IMPLANT
LABEL OR SOLS (LABEL) ×2 IMPLANT
LIGHT WAVEGUIDE WIDE FLAT (MISCELLANEOUS) IMPLANT
MANIFOLD NEPTUNE II (INSTRUMENTS) ×2 IMPLANT
NEEDLE HYPO 22GX1.5 SAFETY (NEEDLE) ×4 IMPLANT
PACK BASIN MINOR ARMC (MISCELLANEOUS) ×2 IMPLANT
PLASMABLADE 3.0S W/LIGHT (MISCELLANEOUS) ×2
SLEEVE PROTECTION STRL DISP (MISCELLANEOUS) ×2 IMPLANT
SLEVE PROBE SENORX GAMMA FIND (MISCELLANEOUS) ×2 IMPLANT
SPONGE DRAIN TRACH 4X4 STRL 2S (GAUZE/BANDAGES/DRESSINGS) ×2 IMPLANT
SPONGE LAP 18X18 RF (DISPOSABLE) ×2 IMPLANT
STAPLER SKIN PROX 35W (STAPLE) IMPLANT
SUT ETHILON 3-0 FS-10 30 BLK (SUTURE)
SUT MNCRL 4-0 (SUTURE) ×1
SUT MNCRL 4-0 27XMFL (SUTURE) ×1
SUT SILK 2 0 (SUTURE) ×1
SUT SILK 2 0 SH (SUTURE) ×2 IMPLANT
SUT SILK 2-0 30XBRD TIE 12 (SUTURE) ×1 IMPLANT
SUT SILK 3 0 12 30 (SUTURE) ×2 IMPLANT
SUT VIC AB 2-0 SH 27 (SUTURE) ×1
SUT VIC AB 2-0 SH 27XBRD (SUTURE) ×1 IMPLANT
SUT VIC AB 3-0 SH 27 (SUTURE) ×4
SUT VIC AB 3-0 SH 27X BRD (SUTURE) ×4 IMPLANT
SUTURE EHLN 3-0 FS-10 30 BLK (SUTURE) IMPLANT
SUTURE MNCRL 4-0 27XMF (SUTURE) ×1 IMPLANT
SYR 10ML LL (SYRINGE) ×2 IMPLANT
SYR 20ML LL LF (SYRINGE) ×4 IMPLANT
SYR BULB IRRIG 60ML STRL (SYRINGE) ×2 IMPLANT
TAPE PAPER 2X10 WHT MICROPORE (GAUZE/BANDAGES/DRESSINGS) ×2 IMPLANT
TUBING CONNECTING 10 (TUBING) ×2 IMPLANT
WATER STERILE IRR 1000ML POUR (IV SOLUTION) ×2 IMPLANT

## 2020-12-01 NOTE — Transfer of Care (Signed)
Immediate Anesthesia Transfer of Care Note  Patient: Amy Woodard  Procedure(s) Performed: TOTAL MASTECTOMY (Left Breast)  Patient Location: PACU  Anesthesia Type:General  Level of Consciousness: drowsy and patient cooperative  Airway & Oxygen Therapy: Patient Spontanous Breathing and Patient connected to face mask oxygen  Post-op Assessment: Report given to RN and Post -op Vital signs reviewed and stable  Post vital signs: Reviewed and stable  Last Vitals:  Vitals Value Taken Time  BP 140/59 12/01/20 1221  Temp 36.6 C 12/01/20 1221  Pulse 74 12/01/20 1223  Resp 15 12/01/20 1223  SpO2 100 % 12/01/20 1223  Vitals shown include unvalidated device data.  Last Pain:  Vitals:   12/01/20 0806  TempSrc: Temporal  PainSc: 0-No pain         Complications: No complications documented.

## 2020-12-01 NOTE — Anesthesia Procedure Notes (Signed)
Procedure Name: LMA Insertion Date/Time: 12/01/2020 9:52 AM Performed by: Lowry Bowl, CRNA Pre-anesthesia Checklist: Patient identified, Emergency Drugs available, Suction available and Patient being monitored Patient Re-evaluated:Patient Re-evaluated prior to induction Oxygen Delivery Method: Circle system utilized Preoxygenation: Pre-oxygenation with 100% oxygen Induction Type: IV induction Ventilation: Mask ventilation without difficulty LMA: LMA inserted LMA Size: 3.5 Number of attempts: 1 Placement Confirmation: positive ETCO2 and breath sounds checked- equal and bilateral Tube secured with: Tape Dental Injury: Teeth and Oropharynx as per pre-operative assessment

## 2020-12-01 NOTE — Anesthesia Postprocedure Evaluation (Signed)
Anesthesia Post Note  Patient: Amy Woodard  Procedure(s) Performed: TOTAL MASTECTOMY (Left Breast)  Patient location during evaluation: PACU Anesthesia Type: General Level of consciousness: awake and alert and oriented Pain management: pain level controlled Vital Signs Assessment: post-procedure vital signs reviewed and stable Respiratory status: spontaneous breathing, nonlabored ventilation and respiratory function stable Cardiovascular status: blood pressure returned to baseline and stable Postop Assessment: no signs of nausea or vomiting Anesthetic complications: no   No complications documented.   Last Vitals:  Vitals:   12/01/20 1354 12/01/20 1416  BP:  (!) 119/51  Pulse:  83  Resp:  16  Temp: 36.7 C 36.6 C  SpO2:  93%    Last Pain:  Vitals:   12/01/20 1416  TempSrc: Oral  PainSc:                  Elisea Khader

## 2020-12-01 NOTE — Interval H&P Note (Signed)
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  The various methods of treatment have been discussed with the patient and family. After consideration of risks, benefits and other options for treatment, the patient has consented to  Procedure(s): TOTAL MASTECTOMY (Left) as a surgical intervention.  The patient's history has been reviewed, patient examined, no change in status, stable for surgery.  I have reviewed the patient's chart and labs.  Questions were answered to the patient's satisfaction.     Amy Woodard Lysle Pearl

## 2020-12-01 NOTE — Op Note (Signed)
Preoperative diagnosis: Left breast Cancer.  Postoperative diagnosis: same.   Procedure: Left simple mastectomy. SLNB, methylene blue injection  Anesthesia: GETA  Surgeon: Dr. Lysle Pearl  Wound Classification: Clean  Specimen: Left breast  Complications: None  Estimated Blood Loss: 25 mL   Indications: Patient is a 80 y.o. 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 After discussion of alternatives, the patient elected (simple) mastectomy.  Findings: Operation performed with curative intent:Yes  Tracer(s) used to identify sentinel nodes in the upfront surgery (non-neoadjuvant) setting (select all that apply):Dye and Radioactive Tracer  Tracer(s) used to identify sentinel nodes in the neoadjuvant setting (select all that apply):N/A  All nodes (colored or non-colored) present at the end of a dye-filled lymphatic channel were removed:N/A  All significantly radioactive nodes were removed:Yes  All palpable suspicious nodes were removed:N/A  Biopsy-proven positive nodes marked with clips prior to chemotherapy were identified and removed:N/A   Description of procedure: The patient was brought to the operating room and general anesthesia was induced. A time-out was completed verifying correct patient, procedure, site, positioning, and implant(s) and/or special equipment prior to beginning this procedure. The breast, chest wall, axilla, and upper arm and neck were prepped and draped in the usual sterile fashion.  Methylene blue injected in the periareolar region to further enhance identification of sentinel lymph node.  At the beginning of the procedure there is some concerns of inadequate radiotracer uptake in the sentinel lymph nodes with initial scanning.  A skin incision was made that encompassed the nipple-areola complex and passed in an oblique direction across the breast.   Skin flaps were raised in the avascular plane  between subcutaneous tissue and breast tissue from the clavicle superiorly, the sternum medially, the anterior rectus sheath inferiorly, and past the lateral border of the pectoralis major muscle laterally. Hemostasis was achieved in the flaps.   Hand-held gamma probe was used to identify the location of the hottest spot in the axilla, thorough the lateral edge of the original incision.  Adequate uptake was noted at this point of the procedure.  Sharp and blunt Dissection was carried down to subdermal facias. The probe was placed within wound and again, the point of maximal count was found. Dissection continue until nodule was identified. The probe was placed in contact with the node and 50counts were recorded. The node was excised in its entirety. Ex vivo, the node measured 500 counts when placed on the probe. The bed of the node measured <100 counts. No additional hot spots were identified. No clinically abnormal nodes were palpated.  No identifiable methylene blue injected nodes were noted at this point either.    Next, the breast tissue and underlying pectoralis fascia were excised from the pectoralis major muscle, progressing from medially to laterally. At the lateral border of the pectoralis major muscle, the breast tissue was swung laterally and a lateral pedicle identified where breast tissue gave way to fat of axilla. The lateral pedicle was incorporated into the final specimen and the specimen removed.   Specimen marked long lateral, short superior and sent to pathology. The wound was irrigated and hemostasis was achieved. Closed suction drains x2 were brought into the operative field through a separate stab incision and sutured to the skin with a 3-0 nylon suture. The wound was closed with interrupted 3-0 Vicryl to the subcutaneous layer, followed by staples.  Skin flaps were under minimal tension at the time of closure the  wound was dressed with honeycomb dressing.  The patient tolerated the  procedure well and was taken to the postanesthesia care unit in stable condition.

## 2020-12-01 NOTE — Anesthesia Preprocedure Evaluation (Signed)
Anesthesia Evaluation  Patient identified by MRN, date of birth, ID band Patient awake    Reviewed: Allergy & Precautions, NPO status , Patient's Chart, lab work & pertinent test results  History of Anesthesia Complications Negative for: history of anesthetic complications  Airway Mallampati: III  TM Distance: >3 FB Neck ROM: Full    Dental no notable dental hx. (+) Partial Upper, Poor Dentition   Pulmonary neg pulmonary ROS, shortness of breath and with exertion, neg sleep apnea, COPD,  COPD inhaler and oxygen dependent, Patient abstained from smoking.Not current smoker, former smoker,     + decreased breath sounds      Cardiovascular Exercise Tolerance: Poor METShypertension, Pt. on medications + CAD  (-) Past MI (-) dysrhythmias  Rhythm:Regular Rate:Normal - Systolic murmurs    Neuro/Psych negative neurological ROS  negative psych ROS   GI/Hepatic GERD  Medicated and Controlled,(+)     (-) substance abuse  ,   Endo/Other  diabetesHypothyroidism   Renal/GU negative Renal ROS     Musculoskeletal   Abdominal   Peds  Hematology   Anesthesia Other Findings Past Medical History: No date: Allergy No date: Aortic atherosclerosis ([place]) 05/2018: Breast cancer ([place])     Comment:  Right breast found 1st, then left breast with ductal ca 05/2018: Cellulitis of breast     Comment:  right breast cellulitis after first biopsy,  antibiotics              prescribed No date: COPD (chronic obstructive pulmonary disease) (HCC) No date: Coronary atherosclerosis No date: Diabetes mellitus without complication (HCC) No date: Dyspnea No date: GERD (gastroesophageal reflux disease)     Comment:  occasionally No date: History of kidney stones No date: HLD (hyperlipidemia) No date: Hypertension No date: Hypothyroidism No date: Oxygen dependent     Comment:  2liter nasal prong continuously No date: Pneumonia No date:  Rheumatoid arthritis (HCC) No date: RLS (restless legs syndrome)  Reproductive/Obstetrics                             Anesthesia Physical Anesthesia Plan  ASA: III  Anesthesia Plan: General   Post-op Pain Management:    Induction: Intravenous  PONV Risk Score and Plan: 3 and Ondansetron and Dexamethasone  Airway Management Planned: Oral ETT  Additional Equipment: None  Intra-op Plan:   Post-operative Plan: Extubation in OR  Informed Consent: I have reviewed the patients History and Physical, chart, labs and discussed the procedure including the risks, benefits and alternatives for the proposed anesthesia with the patient or authorized representative who has indicated his/her understanding and acceptance.     Dental advisory given  Plan Discussed with: CRNA and Surgeon  Anesthesia Plan Comments: (Discussed risks of anesthesia with patient, including PONV, sore throat, lip/dental damage. Rare risks discussed as well, such as cardiorespiratory and neurological sequelae. Patient understands. No previous problems with anesthesia.)        Anesthesia Quick Evaluation

## 2020-12-02 ENCOUNTER — Encounter: Payer: Self-pay | Admitting: Surgery

## 2020-12-02 DIAGNOSIS — D0512 Intraductal carcinoma in situ of left breast: Secondary | ICD-10-CM | POA: Diagnosis not present

## 2020-12-02 LAB — GLUCOSE, CAPILLARY
Glucose-Capillary: 82 mg/dL (ref 70–99)
Glucose-Capillary: 97 mg/dL (ref 70–99)

## 2020-12-02 LAB — BASIC METABOLIC PANEL
Anion gap: 7 (ref 5–15)
BUN: 13 mg/dL (ref 8–23)
CO2: 30 mmol/L (ref 22–32)
Calcium: 8.8 mg/dL — ABNORMAL LOW (ref 8.9–10.3)
Chloride: 102 mmol/L (ref 98–111)
Creatinine, Ser: 0.7 mg/dL (ref 0.44–1.00)
GFR, Estimated: >60 mL/min (ref >60)
Glucose, Bld: 71 mg/dL (ref 70–99)
Potassium: 4.5 mmol/L (ref 3.5–5.1)
Sodium: 139 mmol/L (ref 135–145)

## 2020-12-02 LAB — CBC
HCT: 37.2 % (ref 36.0–46.0)
Hemoglobin: 11.6 g/dL — ABNORMAL LOW (ref 12.0–15.0)
MCH: 28.6 pg (ref 26.0–34.0)
MCHC: 31.2 g/dL (ref 30.0–36.0)
MCV: 91.6 fL (ref 80.0–100.0)
Platelets: 215 10*3/uL (ref 150–400)
RBC: 4.06 MIL/uL (ref 3.87–5.11)
RDW: 14 % (ref 11.5–15.5)
WBC: 10.6 10*3/uL — ABNORMAL HIGH (ref 4.0–10.5)
nRBC: 0 % (ref 0.0–0.2)

## 2020-12-02 MED ORDER — HYDROCODONE-ACETAMINOPHEN 5-325 MG PO TABS: 1.0000 | ORAL_TABLET | ORAL | 0 refills | Status: AC | PRN

## 2020-12-02 NOTE — Plan of Care (Signed)
Discharge teaching completed with patient who is in stable condition. 

## 2020-12-02 NOTE — Progress Notes (Signed)
B/P 110/42 (58), all other vital signs WNL and patient is asymptomatic.  Dr. Dahlia Byes notified.

## 2020-12-02 NOTE — Plan of Care (Signed)
Continuing with plan of care. 

## 2020-12-02 NOTE — Discharge Summary (Signed)
Patient ID: IONA STAY MRN: [id number] DOB/AGE: 79-Sep-1942 79 y.o.  Admit date: 12/01/2020 Discharge date: 12/02/2020   Discharge Diagnoses:  Active Problems:   Breast cancer in situ   Procedures: Left Mastectomy   Hospital Course:  80 underwent simple mastectomy SLN bx by Dr Lysle Pearl She had uneventful postoperative course.  Patient was kept overnight.  At The time of discharge the patient was ambulating,  pain was controlled.  Her vital signs were stable and she was afebrile.   physical exam at discharge showed a pt  in no acute distress.  Awake and alert. Chest wall Mastectomy site intact, no evidence of expanding hematomas, two JP drains w Serosanguineous drainage.   Abdomen: Soft incisions healing well without infection or peritonitis.  Extremities well-perfused and no edema.  Condition of the patient the time of discharge was stable  Disposition: Discharge disposition: 01-Home or Self Care       Discharge Instructions    Call MD for:  difficulty breathing, headache or visual disturbances   Complete by: As directed    Call MD for:  extreme fatigue   Complete by: As directed    Call MD for:  hives   Complete by: As directed    Call MD for:  persistant dizziness or light-headedness   Complete by: As directed    Call MD for:  persistant nausea and vomiting   Complete by: As directed    Call MD for:  redness, tenderness, or signs of infection (pain, swelling, redness, odor or green/yellow discharge around incision site)   Complete by: As directed    Call MD for:  severe uncontrolled pain   Complete by: As directed    Call MD for:  temperature >100.4   Complete by: As directed    Diet - low sodium heart healthy   Complete by: As directed    Discharge instructions   Complete by: As directed    Please teach about JP care. Pt to keep Bras until f/u appt, may change fluffs if they become soaked. Milk JPs drain BID and measure output   Increase activity slowly   Complete  by: As directed    Leave dressing on - Keep it clean, dry, and intact until clinic visit   Complete by: As directed      Allergies as of 12/02/2020      Reactions   Leflunomide Other (See Comments)   headache   Sulfa Antibiotics Itching      Medication List    STOP taking these medications   Calcium 1200 1200-1000 MG-UNIT Chew   omeprazole 20 MG capsule Commonly known as: PRILOSEC     TAKE these medications   anastrozole 1 MG tablet Commonly known as: ARIMIDEX TAKE 1 TABLET BY MOUTH EVERY DAY   azelastine 0.1 % nasal spray Commonly known as: ASTELIN Place 1 spray into both nostrils 2 (two) times daily.   budesonide-formoterol 160-4.5 MCG/ACT inhaler Commonly known as: SYMBICORT Inhale 2 puffs into the lungs 2 (two) times daily.   fluticasone 50 MCG/ACT nasal spray Commonly known as: FLONASE Place 2 sprays into both nostrils daily as needed for allergies.   folic acid 1 MG tablet Commonly known as: FOLVITE Take 1 mg by mouth daily.   glipiZIDE 5 MG 24 hr tablet Commonly known as: GLUCOTROL XL Take 5 mg by mouth daily.   HYDROcodone-acetaminophen 5-325 MG tablet Commonly known as: NORCO/VICODIN Take 1-2 tablets by mouth every 4 (four) hours as needed for moderate pain.  hydroxychloroquine 200 MG tablet Commonly known as: PLAQUENIL Take 400 mg by mouth daily.   Ipratropium-Albuterol 20-100 MCG/ACT Aers respimat Commonly known as: COMBIVENT Inhale 1 puff into the lungs every 6 (six) hours as needed for wheezing or shortness of breath.   irbesartan 75 MG tablet Commonly known as: AVAPRO Take 75 mg by mouth daily.   levothyroxine 50 MCG tablet Commonly known as: SYNTHROID Take 50 mcg by mouth daily before breakfast.   losartan 25 MG tablet Commonly known as: COZAAR Take 1 tablet by mouth daily.   methotrexate 2.5 MG tablet Commonly known as: RHEUMATREX Take by mouth. Take 4 tablets (10 mg total) by mouth every 7 (seven) days   Vitamin D3 50 MCG  (2000 UT) capsule TAKE 1 CAPSULE BY MOUTH EVERY DAY            Discharge Care Instructions  (From admission, onward)         Start     Ordered   12/02/20 0000  Leave dressing on - Keep it clean, dry, and intact until clinic visit        12/02/20 1029          Follow-up Information    Sakai, Isami, DO Follow up in 1 week(s).   Specialty: Surgery Contact information: [place] [address] [phone]                Caroleen Hamman, MD FACS

## 2020-12-05 LAB — SURGICAL PATHOLOGY

## 2020-12-13 NOTE — Progress Notes (Signed)
Patient notified of follow-up appointment date, and to continue Arimidex per Dr. Tasia Catchings request.  Patient states she is doing well post-op .  Had drains removed yesterday.

## 2020-12-21 ENCOUNTER — Other Ambulatory Visit: Payer: Self-pay | Admitting: Oncology

## 2020-12-29 ENCOUNTER — Other Ambulatory Visit: Payer: Self-pay | Admitting: Oncology

## 2020-12-29 MED ORDER — VITAMIN D3 50 MCG (2000 UT) PO CAPS: 2000.0000 [IU] | ORAL_CAPSULE | Freq: Every day | ORAL | 0 refills | Status: DC

## 2021-01-10 ENCOUNTER — Telehealth: Payer: Self-pay

## 2021-01-10 ENCOUNTER — Inpatient Hospital Stay: Payer: PPO | Attending: Oncology | Admitting: Oncology

## 2021-01-10 ENCOUNTER — Encounter: Payer: Self-pay | Admitting: Oncology

## 2021-01-10 VITALS — BP 156/68 | HR 77 | Temp 98.4°F | Resp 18 | Wt 138.8 lb

## 2021-01-10 DIAGNOSIS — Z9981 Dependence on supplemental oxygen: Secondary | ICD-10-CM | POA: Diagnosis not present

## 2021-01-10 DIAGNOSIS — Z79811 Long term (current) use of aromatase inhibitors: Secondary | ICD-10-CM | POA: Insufficient documentation

## 2021-01-10 DIAGNOSIS — E039 Hypothyroidism, unspecified: Secondary | ICD-10-CM | POA: Insufficient documentation

## 2021-01-10 DIAGNOSIS — D0512 Intraductal carcinoma in situ of left breast: Secondary | ICD-10-CM | POA: Diagnosis not present

## 2021-01-10 DIAGNOSIS — K219 Gastro-esophageal reflux disease without esophagitis: Secondary | ICD-10-CM | POA: Insufficient documentation

## 2021-01-10 DIAGNOSIS — Z87891 Personal history of nicotine dependence: Secondary | ICD-10-CM | POA: Insufficient documentation

## 2021-01-10 DIAGNOSIS — Z79899 Other long term (current) drug therapy: Secondary | ICD-10-CM | POA: Insufficient documentation

## 2021-01-10 DIAGNOSIS — I10 Essential (primary) hypertension: Secondary | ICD-10-CM | POA: Diagnosis not present

## 2021-01-10 DIAGNOSIS — Z170 Estrogen receptor positive status [ER+]: Secondary | ICD-10-CM | POA: Insufficient documentation

## 2021-01-10 DIAGNOSIS — E119 Type 2 diabetes mellitus without complications: Secondary | ICD-10-CM | POA: Diagnosis not present

## 2021-01-10 DIAGNOSIS — M8580 Other specified disorders of bone density and structure, unspecified site: Secondary | ICD-10-CM | POA: Insufficient documentation

## 2021-01-10 DIAGNOSIS — J449 Chronic obstructive pulmonary disease, unspecified: Secondary | ICD-10-CM | POA: Insufficient documentation

## 2021-01-10 DIAGNOSIS — C50312 Malignant neoplasm of lower-inner quadrant of left female breast: Secondary | ICD-10-CM | POA: Insufficient documentation

## 2021-01-10 DIAGNOSIS — Z853 Personal history of malignant neoplasm of breast: Secondary | ICD-10-CM | POA: Insufficient documentation

## 2021-01-10 DIAGNOSIS — Z9012 Acquired absence of left breast and nipple: Secondary | ICD-10-CM | POA: Insufficient documentation

## 2021-01-10 DIAGNOSIS — M069 Rheumatoid arthritis, unspecified: Secondary | ICD-10-CM | POA: Diagnosis not present

## 2021-01-10 DIAGNOSIS — Z7984 Long term (current) use of oral hypoglycemic drugs: Secondary | ICD-10-CM | POA: Diagnosis not present

## 2021-01-10 NOTE — Telephone Encounter (Addendum)
[place] pathology department to get ER added to the 10/30/20 pathology that was performed thru [facility] with accession number [id number].  Rhonda at [facility] pat has printed the path report and will give to Dr. Tresa Moore tomorrow to add the ER.

## 2021-01-10 NOTE — Progress Notes (Addendum)
Hematology/Oncology follow up  note [facility] [phone] [phone]   Patient Care Team: Baxter Hire, MD as PCP - General (Internal Medicine) Breast Surgeon Dr.Sakai.   CHIEF COMPLAINTS/REASON FOR VISIT:  Follow up  of breast cancer  HISTORY OF PRESENTING ILLNESS:  Amy Woodard is a  80 y.o.  female with PMH listed below who was referred to me for evaluation of newly diagnosed breast cancer. Patient had mammogram and ultrasound on 03/19/2018 which showed right breast 12:00 1.6 x 1.3 x 1.5 cm mass, no radiographically enlarged adenopathy.  Patient also report feeling a mass for the past couple of months. Biopsy pathology showed: Invasive mammary carcinoma, no special type, grade 3, DCIS present, lymphovascular invasion not identified, ER PR 90% positive, HER-2 negative  Nipple discharge: Denies Family history: Mother had melanoma, passed away at age of 79, sister had cervical cancer.  Sister with colon cancer, maternal grandmother sister OCP use: denies.  Estrogen and progesterone therapy: denies History of radiation to chest: denies.  Previous breast surgery: Denies  She has a complicated course of rest cancer diagnosis and surgery. # 04/16/2018 status post right breast mass lumpectomy and sentinel lymph node biopsy.  Pathology showed invasive mammary carcinoma 1.7 cm, DCIS, high-grade with extensive involvement of lobules.  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  Sentinel lymph node biopsy negative. pT1c pN0. ER >90%, PR >90%, HER 2 negative.   05/14/2018 patient underwent MRI breast bilaterally to determined extent of positive deep margin/lobular type breast cancer. MRI breast showed an irregular 1.4 cm mass in the lower inner quadrant of the left breast. Abnormal non-mass enhancement in the upper outer right breast, axillary tail region could be secondary to postsurgical changes, malignancy  cannot be excluded. 06/05/2018 left breast mass biopsy showed invasive ductal carcinoma, grade 3, [facility] ER 100%, PR 50%, not enough tissue for HER 2 analysis.  07/03/2018 patient underwent right breast deep margin lumpectomy/reexcision: Pathology showed negative for residual malignancy. Left breast needle localized lumpectomy showed negative for residual malignancy.  Sentinel lymph node negative In addition the outer slides on this patient's left breast biopsy were requested and reviewed by Dr. RReuel Derby  Dr. RReuel Derbyfeels the patient's biopsy showed high-grade DCIS with focal areas suspicious for invasion.  Definitive invasive carcinoma is not identified.  DCIS is present in both outside blocks and slides from biopsy and measures 7 mm and 6 mm respectively.  pTis pN0 Patient case have been discussed on breast tumor board multiple times with reviewing of images and pathology findings. Left breast MRI did show a 1.4 cm irregularity, and pathology only 7 mm and a 6 mm DCIS were found at the initial left breast biopsy.  Final left breast lumpectomy did not contain clips which was placed with MRI guidance at biopsy.. Per my discussion with surgeon Dr.Sakai, even clips are found on repeat image, re-excision is not feasible and the next step will be mastectomy.   # 07/03/2018-07/06/2018 admitted for pain control, sentinel lymph node biopsy site hematoma,  #07/10/2018 - 07/14/2018 Patient admitted due to acute on chronic anemia due to hematoma.  She was admitted for evacuation of postoperative left axillary sentinel lymph node biopsy site hematoma.  Status post PRBC transfusion during her admission. # 08/14/2018-08/17/2018 readmitted due to breast cellulitis despite outpatient drainage and oral antibiotics.  # 08/24/2018- 08/28/2018 readmitted due to recurrent right breast abscess.  She was placed on IV antibiotics with Ceftriaxone via picc line.  #A  repeat US has been done on 09/07/2018 and it shows near resolution of the fluid. # #Finished IV Ceftriaxone on 09/11/2018.   Patient has a history of chronic respiratory failure/ COPD, on home oxygen.  # Right breast pT1c pN0, reexcision did not reveal any residual disease.. Oncotype DX showed recurrence rate of 31, patient has 15% benefit from adjuvant chemotherapy.  Decision was made not to proceed with adjuvant chemotherapy due to significant delays secondary to surgical complication, slow recovery from surgery, borderline performance status and comorbidities.  Patient declined radiation due to severe lung condition and is afraid of lung toxicities.[name]  #Left breast high-grade DCIS She has been tolerating antiestrogen treatment with Arimidex very well.  Manageable side effects. left breast biopsy clip was not found in the specimen. Continue Arimidex, tolerates well.   10/27/2018 left unilateral diagnostic mammogram showed left breast lower inner quadrant clip lstill presents results was discussed with patient and patient's surgeon Dr. Lysle Pearl.  Dr. Lysle Pearl feels the only option will be mastectomy. Discussed with patient that there is chance of remaining cancer in her left breast in the area where the clip was placed. Patient is not interested in additional breast surgeries.  Continue antiestrogen treatments and surveillance mammogram.  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 Recommend patient to follow-up with primary care provider.  If it continues to bother her she needs to see a surgery for evaluation and resection.  #09/17/2018 Started on ArimideX # 10/23/2020, MRI breast bilaterally showed indeterminate stippled linear non-mass enhancement involving the lower inner quadrant of the left breast extending from the prior lumpectomy site anteriorly, spanning 2.5 cm.  Indeterminate 0.7 cm mass involving the lower outer quadrant of the left breast at a posterior depth.   Indeterminate 0.9 cm masslike enhancement involving the right middle lobe laterally.  No pathologic lymphadenopathy.  No MRI evidence of malignancy involving the right breast.  # 10/30/2020  biopsy of the linear none mass enhancement in the lower inner posterior left breast.  The MRI biopsy of the second edition was unable to be performed Pathology showed high-grade DCIS of the left breast, the other smaller mass left outer quadrant not able to biopsied. Estrogen receptor was not checked on the specimen.  Case was discussed with surgery Patient agrees with mastectomy.   INTERVAL HISTORY Amy Woodard is a 80 y.o. female who has above history reviewed by me today presents for follow up visit for management of breast cancer.  12/01/2020, left mastectomy with sentinel lymph node biopsy. Focal atypical ductal hyperplasia, background benign mammary parenchyma with fat necrosis.  Scar, and reactive keratinizing squamous metaplasia compatible with prior procedure site.  Clips x2 and biopsy sites identified.  Benign nipple/areolar complex.  Incidental seborrheic keratosis negative for definite DCIS and malignancy. 1 sentinel lymph nodes were negative  Patient reports recovering very well after mastectomy.  No concerns at the mastectomy sites.  Denies any fever, chills.  She reports some focal pain at the mastectomy sites.  Chronic shortness of breath, unchanged.   Review of Systems  Constitutional: Negative for chills, fever, malaise/fatigue and weight loss.  HENT: Negative for nosebleeds and sore throat.   Eyes: Negative for double vision, photophobia and redness.  Respiratory: Positive for shortness of breath. Negative for cough and wheezing.   Cardiovascular: Negative for chest pain, palpitations, orthopnea and leg swelling.  Gastrointestinal: Negative for abdominal pain,  blood in stool, nausea and vomiting.  Genitourinary: Negative for dysuria.  Musculoskeletal: Negative for myalgias.  Skin:  Negative for itching and rash.  Neurological: Negative for dizziness, tingling and tremors.  Endo/Heme/Allergies: Negative for environmental allergies. Does not bruise/bleed easily.  Psychiatric/Behavioral: Negative for depression and hallucinations.    MEDICAL HISTORY:  Past Medical History:  Diagnosis Date  . Allergy   . Aortic atherosclerosis ([place])   . Breast cancer ([place]) 05/2018   Right breast found 1st, then left breast with ductal ca  . Cellulitis of breast 05/2018   right breast cellulitis after first biopsy,  antibiotics prescribed  . COPD (chronic obstructive pulmonary disease) ([place])   . Coronary atherosclerosis   . Diabetes mellitus without complication ([place])   . Dyspnea   . GERD (gastroesophageal reflux disease)    occasionally  . History of kidney stones   . HLD (hyperlipidemia)   . Hypertension   . Hypothyroidism   . Oxygen dependent    2liter nasal prong continuously  . Pneumonia   . Rheumatoid arthritis ([place])   . RLS (restless legs syndrome)     SURGICAL HISTORY: Past Surgical History:  Procedure Laterality Date  . APPENDECTOMY    . BACK SURGERY     lumbar. herniated disc . no metal  . BREAST BIOPSY Right 03/19/2018   INVASIVE MAMMARY CARCINOMA, NO SPECIAL TYPE  . BREAST BIOPSY Left 06/01/2018   MRI bx, grade III invasive ductal carcinoma  . BREAST BIOPSY Right 06/01/2018   MRI bx of enhancing mass, path showed FOREIGN BODY GRANULOMATOUS RESPONSE   . 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 BREAST LUMPECTOMY Right 07/03/2018   Procedure: RE-EXCISION OF RIGHT BREAST TISSUE MASS;  Surgeon: Benjamine Sprague, DO;  Location: [facility] ORS;  Service: General;  Laterality: Right;  . BREAST LUMPECTOMY Left 06/2018   invasive ductal  . IRRIGATION AND DEBRIDEMENT HEMATOMA Left 07/10/2018   Procedure: IRRIGATION AND DEBRIDEMENT HEMATOMA LEFT AXILLARY;  Surgeon: Benjamine Sprague, DO;  Location: [facility] ORS;  Service: General;  Laterality: Left;  . PARTIAL  MASTECTOMY WITH NEEDLE LOCALIZATION AND AXILLARY SENTINEL LYMPH NODE BX Right 04/16/2018   Procedure: PARTIAL MASTECTOMY WITH NEEDLE LOCALIZATION AND AXILLARY SENTINEL LYMPH NODE BX;  Surgeon: Benjamine Sprague, DO;  Location: [facility] ORS;  Service: General;  Laterality: Right;  . PARTIAL MASTECTOMY WITH NEEDLE LOCALIZATION AND AXILLARY SENTINEL LYMPH NODE BX Left 07/03/2018   Procedure: PARTIAL MASTECTOMY WITH NEEDLE LOCALIZATION AND SENTINEL LYMPH NODE BX;  Surgeon: Benjamine Sprague, DO;  Location: [facility] ORS;  Service: General;  Laterality: Left;  . TOTAL MASTECTOMY Left 12/01/2020   Procedure: TOTAL MASTECTOMY;  Surgeon: Benjamine Sprague, DO;  Location: [facility] ORS;  Service: General;  Laterality: Left;    SOCIAL HISTORY: Social History   Socioeconomic History  . Marital status: Married    Spouse name: richard..disabled  . Number of children: Not on file  . Years of education: Not on file  . Highest education level: Not on file  Occupational History  . 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 Smoking status: Former Smoker    Types: Cigarettes    Quit date: 03/30/1993    Years since quitting: 27.8  . Smokeless tobacco: Never Used  Vaping Use  . Vaping Use: Never used  Substance and Sexual Activity  . Alcohol use: No  . Drug use: Never  . Sexual activity: Not Currently  Other Topics Concern  . Not  on file  Social History Narrative  . Not on file   Social Determinants of Health   Financial Resource Strain: Not on file  Food Insecurity: Not on file  Transportation Needs: Not on file  Physical Activity: Not on file  Stress: Not on file  Social Connections: Not on file  Intimate Partner Violence: Not on file    FAMILY HISTORY: Family History  Problem Relation Age of Onset  . Clotting disorder Mother   . Melanoma Mother        deceased 17  . Lung cancer Sister        deceased 27s; smoker  . Alcohol abuse Father        deceased 50  . Cervical cancer Other         neice  . Breast cancer Neg Hx     ALLERGIES:  is allergic to leflunomide and sulfa antibiotics.  MEDICATIONS:  Current Outpatient Medications  Medication Sig Dispense Refill  . anastrozole (ARIMIDEX) 1 MG tablet TAKE 1 TABLET BY MOUTH EVERY DAY (Patient taking differently: Take 1 mg by mouth daily.) 90 tablet 3  . azelastine (ASTELIN) 0.1 % nasal spray Place 1 spray into both nostrils 2 (two) times daily.    . budesonide-formoterol (SYMBICORT) 160-4.5 MCG/ACT inhaler Inhale 2 puffs into the lungs 2 (two) times daily.     . Cholecalciferol (VITAMIN D3) 50 MCG (2000 UT) capsule Take 1 capsule (2,000 Units total) by mouth daily. 90 capsule 0  . fluticasone (FLONASE) 50 MCG/ACT nasal spray Place 2 sprays into both nostrils daily as needed for allergies.    . folic acid (FOLVITE) 1 MG tablet Take 1 mg by mouth daily.    [name] glipiZIDE (GLUCOTROL XL) 5 MG 24 hr tablet Take 5 mg by mouth daily.    [name] HYDROcodone-acetaminophen (NORCO/VICODIN) 5-325 MG tablet Take 1-2 tablets by mouth every 4 (four) hours as needed for moderate pain. 20 tablet 0  . hydroxychloroquine (PLAQUENIL) 200 MG tablet Take 400 mg by mouth daily.     . Ipratropium-Albuterol (COMBIVENT) 20-100 MCG/ACT AERS respimat Inhale 1 puff into the lungs every 6 (six) hours as needed for wheezing or shortness of breath.    . irbesartan (AVAPRO) 75 MG tablet Take 75 mg by mouth daily.    [name] levothyroxine (SYNTHROID, LEVOTHROID) 50 MCG tablet Take 50 mcg by mouth daily before breakfast.    . methotrexate (RHEUMATREX) 2.5 MG tablet Take by mouth. Take 4 tablets (10 mg total) by mouth every 7 (seven) days     No current facility-administered medications for this visit.     PHYSICAL EXAMINATION: ECOG PERFORMANCE STATUS: 1 - Symptomatic but completely ambulatory Vitals:   01/10/21 1022  BP: (!) 156/68  Pulse: 77  Resp: 18  Temp: 98.4 F (36.9 C)   Filed Weights   01/10/21 1022  Weight: 138 lb 12.8 oz (63 kg)    Physical  Exam Constitutional:      General: She is not in acute distress.    Comments: On home oxygen  HENT:     Head: Normocephalic and atraumatic.  Eyes:     General: No scleral icterus.    Conjunctiva/sclera: Conjunctivae normal.     Pupils: Pupils are equal, round, and reactive to light.  Cardiovascular:     Rate and Rhythm: Normal rate and regular rhythm.     Heart sounds: Normal heart sounds.  Pulmonary:     Effort: Pulmonary effort is normal. No respiratory distress.  Comments: Decreased breath sound bilaterally Abdominal:     General: Bowel sounds are normal. There is no distension.     Palpations: Abdomen is soft. There is no mass.     Tenderness: There is no abdominal tenderness.  Musculoskeletal:        General: No deformity. Normal range of motion.     Cervical back: Normal range of motion and neck supple.  Lymphadenopathy:     Cervical: No cervical adenopathy.  Skin:    General: Skin is warm and dry.     Findings: No erythema or rash.     Comments: Left side posterior upper chest wall soft tissue nodule previously worked up which is likely a lipoma  Neurological:     General: No focal deficit present.     Mental Status: She is alert and oriented to person, place, and time.     Cranial Nerves: No cranial nerve deficit.     Coordination: Coordination normal.  Psychiatric:        Mood and Affect: Mood normal.    Breast exam was performed in seated  position. Patient is status post previous right lumpectomy.  Status post recent left mastectomy.  Healing scar.  No focal erythema, discharge.     LABORATORY DATA:  I have reviewed the data as listed Lab Results  Component Value Date   WBC 10.6 (H) 12/02/2020   HGB 11.6 (L) 12/02/2020   HCT 37.2 12/02/2020   MCV 91.6 12/02/2020   PLT 215 12/02/2020   Recent Labs    04/20/20 0950 11/01/20 1401 12/01/20 1444 12/02/20 0608  NA 141 140  --  139  K 4.2 4.3  --  4.5  CL 97* 98  --  102  CO2 37* 32  --  30   GLUCOSE 79 132*  --  71  BUN 15 23  --  13  CREATININE 0.76 0.89 0.91 0.70  CALCIUM 9.4 9.4  --  8.8*  GFRNONAA >60 >60 >60 >60  GFRAA >60  --   --   --   PROT 7.4 7.3  --   --   ALBUMIN 4.3 4.3  --   --   AST 17 20  --   --   ALT 13 20  --   --   ALKPHOS 48 47  --   --   BILITOT 0.5 0.4  --   --    Iron/TIBC/Ferritin/ %Sat No results found for: IRON, TIBC, FERRITIN, IRONPCTSAT      ASSESSMENT & PLAN:  1. History of invasive breast cancer   2. Ductal carcinoma in situ (DCIS) of left breast   3. Osteopenia, unspecified location   4. Aromatase inhibitor use    #Right breast invasive carcinoma [04/2018- lumpectomy],   left breast high-grade DCIS [05/2018-lumpectomy] -mammographic occult malignancy Recurrent left breast high-grade DCIS [10/2020 biopsy- 12/01/20-left mastectomy] no residual DCIS or invasive carcinoma. We will ask pathology to add estrogen receptor testing on biopsy specimen to 10/30/20.  Discussed with [facility] pathologist.  We will send request to [facility] pathology department as the biopsy was originally done there For now continue Arimidex 1 mg daily.  #Osteopenia, last DEXA was more than 2 years ago.  Recommend patient to take vitamin D supplementation.  Also take calcium 1200 mg daily. Patient has previously declined bisphosphonate.  I will repeat DEXA.  # Indeterminate 0.9 cm masslike enhancement involving the right middle lobe laterally 11/07/2020 CT chest wo contrast showed no evidence of metastatic disease  within the thorax.  No masses or pathologically enlarged lymph nodes identified on this unenhanced exam.  Patient has multiple tiny calcified granulomas noted in the right upper lobe.  Mild scarring is seen in the right upper and middle lobes and inferior lingula.  No suspicious nodules or masses identified.  Follow-up in 3 months  All questions were answered. The patient knows to call the clinic with any problems questions or concerns.  Earlie Server, MD,  PhD 01/10/2021  Addendum, Hormone receptor status was added to her 10/30/2020 , results showed ER 95%, PR 0% She is currently on Arimidex and I recommend patient to switch to other AI.

## 2021-01-10 NOTE — Progress Notes (Signed)
Patient here for follow. Pt reports that she had breast surgery around 4/25 and is still having some pain. It is uncomfortable to lay down at night to sleep.

## 2021-01-12 NOTE — Telephone Encounter (Signed)
Report is available for review.

## 2021-01-16 ENCOUNTER — Other Ambulatory Visit: Payer: PPO

## 2021-01-17 ENCOUNTER — Ambulatory Visit
Admission: RE | Admit: 2021-01-17 | Discharge: 2021-01-17 | Disposition: A | Discharge status: Home / Self Care | Payer: PPO | Source: Ambulatory Visit | Attending: Oncology | Admitting: Oncology

## 2021-01-17 ENCOUNTER — Other Ambulatory Visit: Payer: Self-pay

## 2021-01-17 DIAGNOSIS — Z79811 Long term (current) use of aromatase inhibitors: Secondary | ICD-10-CM | POA: Insufficient documentation

## 2021-01-17 DIAGNOSIS — Z780 Asymptomatic menopausal state: Secondary | ICD-10-CM | POA: Diagnosis not present

## 2021-01-17 DIAGNOSIS — Z853 Personal history of malignant neoplasm of breast: Secondary | ICD-10-CM | POA: Insufficient documentation

## 2021-01-17 DIAGNOSIS — M8589 Other specified disorders of bone density and structure, multiple sites: Secondary | ICD-10-CM | POA: Diagnosis not present

## 2021-01-29 DIAGNOSIS — Z20822 Contact with and (suspected) exposure to covid-19: Secondary | ICD-10-CM | POA: Diagnosis not present

## 2021-01-29 DIAGNOSIS — J40 Bronchitis, not specified as acute or chronic: Secondary | ICD-10-CM | POA: Diagnosis not present

## 2021-01-29 DIAGNOSIS — U071 COVID-19: Secondary | ICD-10-CM | POA: Diagnosis not present

## 2021-02-07 DIAGNOSIS — E119 Type 2 diabetes mellitus without complications: Secondary | ICD-10-CM | POA: Diagnosis not present

## 2021-02-07 DIAGNOSIS — E034 Atrophy of thyroid (acquired): Secondary | ICD-10-CM | POA: Diagnosis not present

## 2021-02-14 DIAGNOSIS — E7800 Pure hypercholesterolemia, unspecified: Secondary | ICD-10-CM | POA: Diagnosis not present

## 2021-02-14 DIAGNOSIS — G2581 Restless legs syndrome: Secondary | ICD-10-CM | POA: Diagnosis not present

## 2021-02-14 DIAGNOSIS — E034 Atrophy of thyroid (acquired): Secondary | ICD-10-CM | POA: Diagnosis not present

## 2021-02-14 DIAGNOSIS — J9611 Chronic respiratory failure with hypoxia: Secondary | ICD-10-CM | POA: Diagnosis not present

## 2021-02-14 DIAGNOSIS — I10 Essential (primary) hypertension: Secondary | ICD-10-CM | POA: Diagnosis not present

## 2021-02-14 DIAGNOSIS — E119 Type 2 diabetes mellitus without complications: Secondary | ICD-10-CM | POA: Diagnosis not present

## 2021-02-14 DIAGNOSIS — J449 Chronic obstructive pulmonary disease, unspecified: Secondary | ICD-10-CM | POA: Diagnosis not present

## 2021-02-14 DIAGNOSIS — Z0001 Encounter for general adult medical examination with abnormal findings: Secondary | ICD-10-CM | POA: Diagnosis not present

## 2021-03-19 DIAGNOSIS — Z4432 Encounter for fitting and adjustment of external left breast prosthesis: Secondary | ICD-10-CM | POA: Diagnosis not present

## 2021-03-19 DIAGNOSIS — C50112 Malignant neoplasm of central portion of left female breast: Secondary | ICD-10-CM | POA: Diagnosis not present

## 2021-03-27 DIAGNOSIS — J449 Chronic obstructive pulmonary disease, unspecified: Secondary | ICD-10-CM | POA: Diagnosis not present

## 2021-03-27 DIAGNOSIS — Z9981 Dependence on supplemental oxygen: Secondary | ICD-10-CM | POA: Diagnosis not present

## 2021-03-27 DIAGNOSIS — R0600 Dyspnea, unspecified: Secondary | ICD-10-CM | POA: Diagnosis not present

## 2021-03-27 DIAGNOSIS — J310 Chronic rhinitis: Secondary | ICD-10-CM | POA: Diagnosis not present

## 2021-04-11 ENCOUNTER — Other Ambulatory Visit: Payer: Self-pay

## 2021-04-11 ENCOUNTER — Inpatient Hospital Stay: Payer: PPO | Attending: Oncology

## 2021-04-11 DIAGNOSIS — Z853 Personal history of malignant neoplasm of breast: Secondary | ICD-10-CM

## 2021-04-11 DIAGNOSIS — J9610 Chronic respiratory failure, unspecified whether with hypoxia or hypercapnia: Secondary | ICD-10-CM | POA: Insufficient documentation

## 2021-04-11 DIAGNOSIS — M069 Rheumatoid arthritis, unspecified: Secondary | ICD-10-CM | POA: Diagnosis not present

## 2021-04-11 DIAGNOSIS — Z9981 Dependence on supplemental oxygen: Secondary | ICD-10-CM | POA: Insufficient documentation

## 2021-04-11 DIAGNOSIS — Z9011 Acquired absence of right breast and nipple: Secondary | ICD-10-CM | POA: Insufficient documentation

## 2021-04-11 DIAGNOSIS — Z79811 Long term (current) use of aromatase inhibitors: Secondary | ICD-10-CM | POA: Insufficient documentation

## 2021-04-11 DIAGNOSIS — E039 Hypothyroidism, unspecified: Secondary | ICD-10-CM | POA: Diagnosis not present

## 2021-04-11 DIAGNOSIS — Z79899 Other long term (current) drug therapy: Secondary | ICD-10-CM | POA: Insufficient documentation

## 2021-04-11 DIAGNOSIS — D0512 Intraductal carcinoma in situ of left breast: Secondary | ICD-10-CM | POA: Diagnosis not present

## 2021-04-11 DIAGNOSIS — C50811 Malignant neoplasm of overlapping sites of right female breast: Secondary | ICD-10-CM | POA: Insufficient documentation

## 2021-04-11 DIAGNOSIS — Z170 Estrogen receptor positive status [ER+]: Secondary | ICD-10-CM | POA: Diagnosis not present

## 2021-04-11 DIAGNOSIS — I2510 Atherosclerotic heart disease of native coronary artery without angina pectoris: Secondary | ICD-10-CM | POA: Insufficient documentation

## 2021-04-11 DIAGNOSIS — M8580 Other specified disorders of bone density and structure, unspecified site: Secondary | ICD-10-CM | POA: Diagnosis not present

## 2021-04-11 DIAGNOSIS — I700 Atherosclerosis of aorta: Secondary | ICD-10-CM | POA: Insufficient documentation

## 2021-04-11 DIAGNOSIS — Z9012 Acquired absence of left breast and nipple: Secondary | ICD-10-CM | POA: Insufficient documentation

## 2021-04-11 DIAGNOSIS — Z7984 Long term (current) use of oral hypoglycemic drugs: Secondary | ICD-10-CM | POA: Diagnosis not present

## 2021-04-11 DIAGNOSIS — E119 Type 2 diabetes mellitus without complications: Secondary | ICD-10-CM | POA: Diagnosis not present

## 2021-04-11 DIAGNOSIS — K219 Gastro-esophageal reflux disease without esophagitis: Secondary | ICD-10-CM | POA: Insufficient documentation

## 2021-04-11 DIAGNOSIS — I10 Essential (primary) hypertension: Secondary | ICD-10-CM | POA: Diagnosis not present

## 2021-04-11 DIAGNOSIS — J449 Chronic obstructive pulmonary disease, unspecified: Secondary | ICD-10-CM | POA: Insufficient documentation

## 2021-04-11 LAB — COMPREHENSIVE METABOLIC PANEL
ALT: 12 U/L (ref 0–44)
AST: 17 U/L (ref 15–41)
Albumin: 4.3 g/dL (ref 3.5–5.0)
Alkaline Phosphatase: 55 U/L (ref 38–126)
Anion gap: 7 (ref 5–15)
BUN: 19 mg/dL (ref 8–23)
CO2: 32 mmol/L (ref 22–32)
Calcium: 9.1 mg/dL (ref 8.9–10.3)
Chloride: 98 mmol/L (ref 98–111)
Creatinine, Ser: 0.96 mg/dL (ref 0.44–1.00)
GFR, Estimated: >60 mL/min (ref >60)
Glucose, Bld: 163 mg/dL — ABNORMAL HIGH (ref 70–99)
Potassium: 4 mmol/L (ref 3.5–5.1)
Sodium: 137 mmol/L (ref 135–145)
Total Bilirubin: 0.4 mg/dL (ref 0.3–1.2)
Total Protein: 7.4 g/dL (ref 6.5–8.1)

## 2021-04-11 LAB — CBC WITH DIFFERENTIAL/PLATELET
Abs Immature Granulocytes: 0.01 10*3/uL (ref 0.00–0.07)
Basophils Absolute: 0.1 10*3/uL (ref 0.0–0.1)
Basophils Relative: 2 %
Eosinophils Absolute: 0.5 10*3/uL (ref 0.0–0.5)
Eosinophils Relative: 6 %
HCT: 42.9 % (ref 36.0–46.0)
Hemoglobin: 13.3 g/dL (ref 12.0–15.0)
Immature Granulocytes: 0 %
Lymphocytes Relative: 28 %
Lymphs Abs: 2.2 10*3/uL (ref 0.7–4.0)
MCH: 27.6 pg (ref 26.0–34.0)
MCHC: 31 g/dL (ref 30.0–36.0)
MCV: 89 fL (ref 80.0–100.0)
Monocytes Absolute: 0.5 10*3/uL (ref 0.1–1.0)
Monocytes Relative: 7 %
Neutro Abs: 4.5 10*3/uL (ref 1.7–7.7)
Neutrophils Relative %: 57 %
Platelets: 288 10*3/uL (ref 150–400)
RBC: 4.82 MIL/uL (ref 3.87–5.11)
RDW: 13.2 % (ref 11.5–15.5)
WBC: 7.9 10*3/uL (ref 4.0–10.5)
nRBC: 0 % (ref 0.0–0.2)

## 2021-04-11 LAB — VITAMIN D 25 HYDROXY (VIT D DEFICIENCY, FRACTURES): Vit D, 25-Hydroxy: 51.93 ng/mL (ref 30–100)

## 2021-04-13 ENCOUNTER — Inpatient Hospital Stay: Payer: PPO | Admitting: Oncology

## 2021-04-20 ENCOUNTER — Inpatient Hospital Stay: Payer: PPO | Admitting: Oncology

## 2021-04-20 ENCOUNTER — Encounter: Payer: Self-pay | Admitting: Oncology

## 2021-04-20 VITALS — BP 144/68 | HR 78 | Temp 97.6°F | Resp 16 | Wt 138.9 lb

## 2021-04-20 DIAGNOSIS — M8580 Other specified disorders of bone density and structure, unspecified site: Secondary | ICD-10-CM | POA: Diagnosis not present

## 2021-04-20 DIAGNOSIS — Z853 Personal history of malignant neoplasm of breast: Secondary | ICD-10-CM | POA: Diagnosis not present

## 2021-04-20 DIAGNOSIS — D0512 Intraductal carcinoma in situ of left breast: Secondary | ICD-10-CM | POA: Diagnosis not present

## 2021-04-20 NOTE — Progress Notes (Signed)
Hematology/Oncology follow up  note [facility] [phone] [phone]   Patient Care Team: Baxter Hire, MD as PCP - General (Internal Medicine) Breast Surgeon Dr.Sakai.   CHIEF COMPLAINTS/REASON FOR VISIT:  Follow up  of breast cancer  HISTORY OF PRESENTING ILLNESS:  Patient had mammogram and ultrasound on 03/19/2018 which showed right breast 12:00 1.6 x 1.3 x 1.5 cm mass, no radiographically enlarged adenopathy.  Patient also report feeling a mass for the past couple of months. 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  On 04/16/2018 she had a right breast mass lumpectomy and sentinel lymph node biopsy.  On 05/14/2018 had MRI breast bilaterally to determine extent of her breast cancer.  Pathology showed not enough tissue for HER2/neu analysis although she was ER/PR positive.  Had right deep margin lumpectomy reexcision on 07/03/2018 and pathology was negative for residual malignancy.  She had a series of admissions following her biopsies including pain control, biopsy site hematoma, acute on chronic anemia due to hematoma requiring PRBC transfusion, cellulitis with recurrent right breast abscess requiring IV antibiotics.  She did not require adjuvant chemotherapy and declined radiation due to toxicity risk.  She was placed on Arimidex(09/17/2018) which she tolerates well.  Patient has a history of chronic respiratory failure/ COPD, on home oxygen.  She had a mammogram on 10/23/2020 which showed non-mass enhancement along the lower inner quadrant of the left breast extending from the prior lumpectomy site anteriorly.  MRI was negative for malignancy involving the right breast.  She required a biopsy of her left breast which showed high-grade DCIS.  Estrogen  receptor was not checked on the specimen.  Case was discussed with surgery and mastectomy was recommended.  She had left mastectomy on 12/01/2020 and appeared to do well.  INTERVAL HISTORY Amy Woodard presents back today for routine follow-up.  She continues to do well postmastectomy.   Review of Systems  Constitutional: Negative.  Negative for chills, fever, malaise/fatigue and weight loss.  HENT:  Negative for congestion, ear pain and tinnitus.   Eyes: Negative.  Negative for blurred vision and double vision.  Respiratory: Negative.  Negative for cough, sputum production and shortness of breath.   Cardiovascular: Negative.  Negative for chest pain, palpitations and leg swelling.  Gastrointestinal: Negative.  Negative for abdominal pain, constipation, diarrhea, nausea and vomiting.  Genitourinary:  Negative for dysuria, frequency and urgency.  Musculoskeletal:  Negative for back pain and falls.  Skin: Negative.  Negative for rash.  Neurological: Negative.  Negative for weakness and headaches.  Endo/Heme/Allergies: Negative.  Does not bruise/bleed easily.  Psychiatric/Behavioral: Negative.  Negative for depression. The patient is not nervous/anxious and does not have insomnia.    MEDICAL HISTORY:  Past Medical History:  Diagnosis Date   Allergy    Aortic atherosclerosis ([place])    Breast cancer ([employer]) 05/2018   Right breast found 1st, then left breast with ductal ca   Cellulitis of breast 05/2018   right breast cellulitis after first biopsy,  antibiotics prescribed   COPD (chronic obstructive pulmonary disease) ([facility])    Coronary atherosclerosis    Diabetes mellitus without complication ([facility])    Dyspnea    GERD (gastroesophageal reflux disease)    occasionally   History of kidney stones    HLD (hyperlipidemia)    Hypertension  Hypothyroidism    Oxygen dependent    2liter nasal prong continuously   Pneumonia    Rheumatoid arthritis ([facility])    RLS (restless legs syndrome)      SURGICAL HISTORY: Past Surgical History:  Procedure Laterality Date   APPENDECTOMY     BACK SURGERY     lumbar. herniated disc . no metal   BREAST BIOPSY Right 03/19/2018   INVASIVE MAMMARY CARCINOMA, NO SPECIAL TYPE   BREAST BIOPSY Left 06/01/2018   MRI bx, grade III invasive ductal carcinoma   BREAST BIOPSY Right 06/01/2018   MRI bx of enhancing mass, path showed FOREIGN BODY GRANULOMATOUS RESPONSE    BREAST LUMPECTOMY Right 04/16/2018   [facility], DCIS, LN negative   BREAST LUMPECTOMY Right 07/03/2018   Procedure: RE-EXCISION OF RIGHT BREAST TISSUE MASS;  Surgeon: Benjamine Sprague, DO;  Location: [facility] ORS;  Service: General;  Laterality: Right;   BREAST LUMPECTOMY Left 06/2018   invasive ductal   IRRIGATION AND DEBRIDEMENT HEMATOMA Left 07/10/2018   Procedure: IRRIGATION AND DEBRIDEMENT HEMATOMA LEFT AXILLARY;  Surgeon: Benjamine Sprague, DO;  Location: [facility];  Service: General;  Laterality: Left;   PARTIAL MASTECTOMY WITH NEEDLE LOCALIZATION AND AXILLARY SENTINEL LYMPH NODE BX Right 04/16/2018   Procedure: PARTIAL MASTECTOMY WITH NEEDLE LOCALIZATION AND AXILLARY SENTINEL LYMPH NODE BX;  Surgeon: Benjamine Sprague, DO;  Location: [facility];  Service: General;  Laterality: Right;   PARTIAL MASTECTOMY WITH NEEDLE LOCALIZATION AND AXILLARY SENTINEL LYMPH NODE BX Left 07/03/2018   Procedure: PARTIAL MASTECTOMY WITH NEEDLE LOCALIZATION AND SENTINEL LYMPH NODE BX;  Surgeon: Benjamine Sprague, DO;  Location: [facility];  Service: General;  Laterality: Left;   TOTAL MASTECTOMY Left 12/01/2020   Procedure: TOTAL MASTECTOMY;  Surgeon: Benjamine Sprague, DO;  Location: [facility];  Service: General;  Laterality: Left;    SOCIAL HISTORY: Social History   Socioeconomic History   Marital status: Married    Spouse name: richard..disabled   Number of children: Not on file   Years of education: Not on file   Highest education level: Not on file  Occupational History   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  Tobacco Use   Smoking status: Former    Types: Cigarettes    Quit date: 03/30/1993    Years since quitting: 28.0   Smokeless tobacco: Never  Vaping Use   Vaping Use: Never used  Substance and Sexual Activity   Alcohol use: No   Drug use: Never   Sexual activity: Not Currently  Other Topics Concern   Not on file  Social History Narrative   Not on file   Social Determinants of Health   Financial Resource Strain: Not on file  Food Insecurity: Not on file  Transportation Needs: Not on file  Physical Activity: Not on file  Stress: Not on file  Social Connections: Not on file  Intimate Partner Violence: Not on file    FAMILY HISTORY: Family History  Problem Relation Age of Onset   Clotting disorder Mother    Melanoma Mother        deceased 31   Lung cancer Sister        deceased 25s; smoker   Alcohol abuse Father        deceased 29   Cervical cancer Other        neice   Breast cancer Neg Hx     ALLERGIES:  is allergic to leflunomide and sulfa antibiotics.  MEDICATIONS:  Current Outpatient Medications  Medication Sig Dispense Refill   anastrozole (ARIMIDEX) 1 MG tablet TAKE 1 TABLET BY MOUTH EVERY DAY (Patient taking differently: Take 1 mg by mouth daily.) 90 tablet 3   azelastine (ASTELIN) 0.1 % nasal spray Place 1 spray into both nostrils 2 (two) times daily.     budesonide-formoterol (SYMBICORT) 160-4.5 MCG/ACT inhaler Inhale 2 puffs into the lungs 2 (two) times daily.      Cholecalciferol (VITAMIN D3) 50 MCG (2000 UT) capsule Take 1 capsule (2,000 Units total) by mouth daily. 90 capsule 0   fluticasone (FLONASE) 50 MCG/ACT nasal spray Place 2 sprays into both nostrils daily as needed for allergies.     folic acid (FOLVITE) 1 MG tablet Take 1 mg by mouth daily.     glipiZIDE (GLUCOTROL XL) 5 MG 24 hr tablet Take 5 mg by mouth daily.     HYDROcodone-acetaminophen (NORCO/VICODIN) 5-325 MG tablet Take 1-2 tablets by mouth every 4 (four) hours as needed for  moderate pain. 20 tablet 0   hydroxychloroquine (PLAQUENIL) 200 MG tablet Take 400 mg by mouth daily.      Ipratropium-Albuterol (COMBIVENT) 20-100 MCG/ACT AERS respimat Inhale 1 puff into the lungs every 6 (six) hours as needed for wheezing or shortness of breath.     irbesartan (AVAPRO) 75 MG tablet Take 75 mg by mouth daily.     levothyroxine (SYNTHROID, LEVOTHROID) 50 MCG tablet Take 50 mcg by mouth daily before breakfast.     methotrexate (RHEUMATREX) 2.5 MG tablet Take by mouth. Take 4 tablets (10 mg total) by mouth every 7 (seven) days     No current facility-administered medications for this visit.     PHYSICAL EXAMINATION: ECOG PERFORMANCE STATUS: 1 - Symptomatic but completely ambulatory There were no vitals filed for this visit.  There were no vitals filed for this visit.   Physical Exam Constitutional:      Appearance: Normal appearance.  HENT:     Head: Normocephalic and atraumatic.  Eyes:     Pupils: Pupils are equal, round, and reactive to light.  Cardiovascular:     Rate and Rhythm: Normal rate and regular rhythm.     Heart sounds: Normal heart sounds. No murmur heard. Pulmonary:     Effort: Pulmonary effort is normal.     Breath sounds: Normal breath sounds. No wheezing.  Abdominal:     General: Bowel sounds are normal. There is no distension.     Palpations: Abdomen is soft.     Tenderness: There is no abdominal tenderness.  Musculoskeletal:        General: Normal range of motion.     Cervical back: Normal range of motion.  Skin:    General: Skin is warm and dry.     Findings: No rash.  Neurological:     Mental Status: She is alert and oriented to person, place, and time.  Psychiatric:        Judgment: Judgment normal.      LABORATORY DATA:  I have reviewed the data as listed Lab Results  Component Value Date   WBC 7.9 04/11/2021   HGB 13.3 04/11/2021   HCT 42.9 04/11/2021   MCV 89.0 04/11/2021   PLT 288 04/11/2021   Recent Labs     11/01/20 1401 12/01/20 1444 12/02/20 0608 04/11/21 1406  NA 140  --  139 137  K 4.3  --  4.5 4.0  CL 98  --  102 98  CO2 32  --  30 32  GLUCOSE  132*  --  71 163*  BUN 23  --  13 19  CREATININE 0.89 0.91 0.70 0.96  CALCIUM 9.4  --  8.8* 9.1  GFRNONAA >60 >60 >60 >60  PROT 7.3  --   --  7.4  ALBUMIN 4.3  --   --  4.3  AST 20  --   --  17  ALT 20  --   --  12  ALKPHOS 47  --   --  55  BILITOT 0.4  --   --  0.4    Iron/TIBC/Ferritin/ %Sat No results found for: IRON, TIBC, FERRITIN, IRONPCTSAT      ASSESSMENT & PLAN:  No diagnosis found.  Bilateral breast cancer- She was diagnosed in August 2019 and status postlumpectomy.  Repeat bilateral mammogram showed left breast concerns status post lumpectomy on 05/2018 which revealed left breast high-grade DCIS.  She had a mammogram in January 2022 which showed possible residual or recurrent DCIS.  She is status post biopsy on 10/30/20 and mastectomy on 12/01/2020.  Hormone receptor status was added on 10/30/2020 which showed ER positive PR negative and it was recommended she switch to a different aromatase inhibitor. No residual DCIS or invasive carcinoma.  She is currently on Arimidex 1 mg daily and I spoke with Dr. Tasia Catchings who would like to switch her to letrozole.  Spoke to patient and notified her.  New prescription sent to her pharmacy.  Osteopenia- Bone density from 01/17/2021 shows a T score of -2.1.  She is currently taking vitamin D and calcium.  Previously declined bisphosphonates.  Disposition- Stop anastrozole Start Femara. New RX sent to pharmacy. Return to clinic in 6 months for follow-up after repeat mammogram.   I spent 25 minutes dedicated to the care of this patient (face-to-face and non-face-to-face) on the date of the encounter to include what is described in the assessment and plan.  Amy Casa, NP 04/20/2021 1:04 PM

## 2021-04-20 NOTE — Progress Notes (Signed)
Patient denies new problems/concerns today.   °

## 2021-04-23 MED ORDER — LETROZOLE 2.5 MG PO TABS: 2.5000 mg | ORAL_TABLET | Freq: Every day | ORAL | 2 refills | Status: DC

## 2021-04-27 DIAGNOSIS — C50112 Malignant neoplasm of central portion of left female breast: Secondary | ICD-10-CM | POA: Diagnosis not present

## 2021-04-27 DIAGNOSIS — Z9012 Acquired absence of left breast and nipple: Secondary | ICD-10-CM | POA: Diagnosis not present

## 2021-05-09 DIAGNOSIS — R0609 Other forms of dyspnea: Secondary | ICD-10-CM | POA: Diagnosis not present

## 2021-05-09 DIAGNOSIS — Z170 Estrogen receptor positive status [ER+]: Secondary | ICD-10-CM | POA: Diagnosis not present

## 2021-05-09 DIAGNOSIS — C50411 Malignant neoplasm of upper-outer quadrant of right female breast: Secondary | ICD-10-CM | POA: Diagnosis not present

## 2021-05-09 DIAGNOSIS — Z9981 Dependence on supplemental oxygen: Secondary | ICD-10-CM | POA: Diagnosis not present

## 2021-05-09 DIAGNOSIS — J449 Chronic obstructive pulmonary disease, unspecified: Secondary | ICD-10-CM | POA: Diagnosis not present

## 2021-05-23 DIAGNOSIS — Z79899 Other long term (current) drug therapy: Secondary | ICD-10-CM | POA: Diagnosis not present

## 2021-05-23 DIAGNOSIS — M0579 Rheumatoid arthritis with rheumatoid factor of multiple sites without organ or systems involvement: Secondary | ICD-10-CM | POA: Diagnosis not present

## 2021-05-28 DIAGNOSIS — Z79899 Other long term (current) drug therapy: Secondary | ICD-10-CM | POA: Diagnosis not present

## 2021-05-28 DIAGNOSIS — M542 Cervicalgia: Secondary | ICD-10-CM | POA: Diagnosis not present

## 2021-05-28 DIAGNOSIS — C50512 Malignant neoplasm of lower-outer quadrant of left female breast: Secondary | ICD-10-CM | POA: Diagnosis not present

## 2021-05-28 DIAGNOSIS — M0579 Rheumatoid arthritis with rheumatoid factor of multiple sites without organ or systems involvement: Secondary | ICD-10-CM | POA: Diagnosis not present

## 2021-05-28 DIAGNOSIS — J449 Chronic obstructive pulmonary disease, unspecified: Secondary | ICD-10-CM | POA: Diagnosis not present

## 2021-06-13 DIAGNOSIS — E119 Type 2 diabetes mellitus without complications: Secondary | ICD-10-CM | POA: Diagnosis not present

## 2021-06-13 DIAGNOSIS — E034 Atrophy of thyroid (acquired): Secondary | ICD-10-CM | POA: Diagnosis not present

## 2021-06-20 DIAGNOSIS — E034 Atrophy of thyroid (acquired): Secondary | ICD-10-CM | POA: Diagnosis not present

## 2021-06-20 DIAGNOSIS — E7800 Pure hypercholesterolemia, unspecified: Secondary | ICD-10-CM | POA: Diagnosis not present

## 2021-06-20 DIAGNOSIS — Z23 Encounter for immunization: Secondary | ICD-10-CM | POA: Diagnosis not present

## 2021-06-20 DIAGNOSIS — I10 Essential (primary) hypertension: Secondary | ICD-10-CM | POA: Diagnosis not present

## 2021-06-20 DIAGNOSIS — G2581 Restless legs syndrome: Secondary | ICD-10-CM | POA: Diagnosis not present

## 2021-06-20 DIAGNOSIS — E119 Type 2 diabetes mellitus without complications: Secondary | ICD-10-CM | POA: Diagnosis not present

## 2021-06-20 DIAGNOSIS — J9611 Chronic respiratory failure with hypoxia: Secondary | ICD-10-CM | POA: Diagnosis not present

## 2021-06-20 DIAGNOSIS — J449 Chronic obstructive pulmonary disease, unspecified: Secondary | ICD-10-CM | POA: Diagnosis not present

## 2021-06-20 DIAGNOSIS — M059 Rheumatoid arthritis with rheumatoid factor, unspecified: Secondary | ICD-10-CM | POA: Diagnosis not present

## 2021-07-10 ENCOUNTER — Other Ambulatory Visit: Payer: Self-pay | Admitting: Oncology

## 2021-07-18 ENCOUNTER — Other Ambulatory Visit: Payer: Self-pay

## 2021-07-18 ENCOUNTER — Telehealth: Payer: Self-pay

## 2021-07-18 DIAGNOSIS — C50919 Malignant neoplasm of unspecified site of unspecified female breast: Secondary | ICD-10-CM

## 2021-07-18 NOTE — Telephone Encounter (Signed)
Patient last saw Sonia Baller NP in Aug 2022 and per her note, RTC in 6 months. Please move out appts (lab/MD) 6 months from when she last saw Sonia Baller and notify pt of appts. Appts should be in FEB 2023

## 2021-07-19 ENCOUNTER — Inpatient Hospital Stay: Payer: PPO

## 2021-07-19 NOTE — Telephone Encounter (Signed)
yes

## 2021-07-19 NOTE — Telephone Encounter (Signed)
Jenn, will you move appts to be on the same day please Lab/MD. She will be getting CBC, CMP so no need to do prior. Thanks. Please update pt of appts.

## 2021-07-23 ENCOUNTER — Inpatient Hospital Stay: Payer: PPO | Admitting: Oncology

## 2021-07-29 ENCOUNTER — Other Ambulatory Visit: Payer: Self-pay | Admitting: Oncology

## 2021-09-07 DIAGNOSIS — M109 Gout, unspecified: Secondary | ICD-10-CM | POA: Diagnosis not present

## 2021-09-07 DIAGNOSIS — M7989 Other specified soft tissue disorders: Secondary | ICD-10-CM | POA: Diagnosis not present

## 2021-09-07 DIAGNOSIS — M79674 Pain in right toe(s): Secondary | ICD-10-CM | POA: Diagnosis not present

## 2021-09-13 DIAGNOSIS — E119 Type 2 diabetes mellitus without complications: Secondary | ICD-10-CM | POA: Diagnosis not present

## 2021-09-13 DIAGNOSIS — E034 Atrophy of thyroid (acquired): Secondary | ICD-10-CM | POA: Diagnosis not present

## 2021-09-20 DIAGNOSIS — M059 Rheumatoid arthritis with rheumatoid factor, unspecified: Secondary | ICD-10-CM | POA: Diagnosis not present

## 2021-09-20 DIAGNOSIS — J449 Chronic obstructive pulmonary disease, unspecified: Secondary | ICD-10-CM | POA: Diagnosis not present

## 2021-09-20 DIAGNOSIS — E7800 Pure hypercholesterolemia, unspecified: Secondary | ICD-10-CM | POA: Diagnosis not present

## 2021-09-20 DIAGNOSIS — J9611 Chronic respiratory failure with hypoxia: Secondary | ICD-10-CM | POA: Diagnosis not present

## 2021-09-20 DIAGNOSIS — E119 Type 2 diabetes mellitus without complications: Secondary | ICD-10-CM | POA: Diagnosis not present

## 2021-09-20 DIAGNOSIS — E034 Atrophy of thyroid (acquired): Secondary | ICD-10-CM | POA: Diagnosis not present

## 2021-09-20 DIAGNOSIS — I10 Essential (primary) hypertension: Secondary | ICD-10-CM | POA: Diagnosis not present

## 2021-09-20 DIAGNOSIS — G2581 Restless legs syndrome: Secondary | ICD-10-CM | POA: Diagnosis not present

## 2021-09-20 DIAGNOSIS — Z0000 Encounter for general adult medical examination without abnormal findings: Secondary | ICD-10-CM | POA: Diagnosis not present

## 2021-10-07 ENCOUNTER — Other Ambulatory Visit: Payer: Self-pay | Admitting: Oncology

## 2021-10-08 NOTE — Telephone Encounter (Signed)
Notes        Component Ref Range & Units 6 mo ago 11 mo ago 1 yr ago 2 yr ago  Vit D, 25-Hydroxy 30 - 100 ng/mL 51.93  43.78 CM  44.57 CM  16.51 Low  CM

## 2021-10-16 DIAGNOSIS — M069 Rheumatoid arthritis, unspecified: Secondary | ICD-10-CM | POA: Diagnosis not present

## 2021-10-16 DIAGNOSIS — Z79899 Other long term (current) drug therapy: Secondary | ICD-10-CM | POA: Diagnosis not present

## 2021-10-19 ENCOUNTER — Other Ambulatory Visit: Payer: PPO

## 2021-10-22 ENCOUNTER — Inpatient Hospital Stay: Payer: PPO | Admitting: Oncology

## 2021-10-22 ENCOUNTER — Inpatient Hospital Stay: Payer: PPO

## 2021-10-23 DIAGNOSIS — J029 Acute pharyngitis, unspecified: Secondary | ICD-10-CM | POA: Diagnosis not present

## 2021-10-23 DIAGNOSIS — E119 Type 2 diabetes mellitus without complications: Secondary | ICD-10-CM | POA: Diagnosis not present

## 2021-10-23 DIAGNOSIS — J449 Chronic obstructive pulmonary disease, unspecified: Secondary | ICD-10-CM | POA: Diagnosis not present

## 2021-10-30 DIAGNOSIS — Z9981 Dependence on supplemental oxygen: Secondary | ICD-10-CM | POA: Diagnosis not present

## 2021-10-30 DIAGNOSIS — R0609 Other forms of dyspnea: Secondary | ICD-10-CM | POA: Diagnosis not present

## 2021-10-30 DIAGNOSIS — J449 Chronic obstructive pulmonary disease, unspecified: Secondary | ICD-10-CM | POA: Diagnosis not present

## 2021-10-30 DIAGNOSIS — R0600 Dyspnea, unspecified: Secondary | ICD-10-CM | POA: Diagnosis not present

## 2021-11-05 DIAGNOSIS — J449 Chronic obstructive pulmonary disease, unspecified: Secondary | ICD-10-CM | POA: Diagnosis not present

## 2021-11-12 ENCOUNTER — Telehealth: Payer: Self-pay | Admitting: *Deleted

## 2021-11-12 NOTE — Telephone Encounter (Signed)
Patient needs to reschedule missed appointment from February. ?

## 2021-11-15 ENCOUNTER — Inpatient Hospital Stay: Payer: PPO

## 2021-11-15 ENCOUNTER — Other Ambulatory Visit: Payer: Self-pay

## 2021-11-15 ENCOUNTER — Encounter: Payer: Self-pay | Admitting: Oncology

## 2021-11-15 ENCOUNTER — Inpatient Hospital Stay: Payer: PPO | Attending: Oncology | Admitting: Oncology

## 2021-11-15 VITALS — BP 137/61 | HR 108 | Temp 98.7°F | Resp 16 | Wt 142.3 lb

## 2021-11-15 DIAGNOSIS — C50919 Malignant neoplasm of unspecified site of unspecified female breast: Secondary | ICD-10-CM

## 2021-11-15 DIAGNOSIS — I10 Essential (primary) hypertension: Secondary | ICD-10-CM | POA: Diagnosis not present

## 2021-11-15 DIAGNOSIS — Z7984 Long term (current) use of oral hypoglycemic drugs: Secondary | ICD-10-CM | POA: Insufficient documentation

## 2021-11-15 DIAGNOSIS — Z9981 Dependence on supplemental oxygen: Secondary | ICD-10-CM | POA: Diagnosis not present

## 2021-11-15 DIAGNOSIS — Z9012 Acquired absence of left breast and nipple: Secondary | ICD-10-CM | POA: Insufficient documentation

## 2021-11-15 DIAGNOSIS — Z79899 Other long term (current) drug therapy: Secondary | ICD-10-CM | POA: Insufficient documentation

## 2021-11-15 DIAGNOSIS — Z170 Estrogen receptor positive status [ER+]: Secondary | ICD-10-CM | POA: Diagnosis not present

## 2021-11-15 DIAGNOSIS — R42 Dizziness and giddiness: Secondary | ICD-10-CM | POA: Insufficient documentation

## 2021-11-15 DIAGNOSIS — E039 Hypothyroidism, unspecified: Secondary | ICD-10-CM | POA: Insufficient documentation

## 2021-11-15 DIAGNOSIS — M1990 Unspecified osteoarthritis, unspecified site: Secondary | ICD-10-CM | POA: Diagnosis not present

## 2021-11-15 DIAGNOSIS — E119 Type 2 diabetes mellitus without complications: Secondary | ICD-10-CM | POA: Insufficient documentation

## 2021-11-15 DIAGNOSIS — G4700 Insomnia, unspecified: Secondary | ICD-10-CM | POA: Diagnosis not present

## 2021-11-15 DIAGNOSIS — M069 Rheumatoid arthritis, unspecified: Secondary | ICD-10-CM | POA: Diagnosis not present

## 2021-11-15 DIAGNOSIS — N951 Menopausal and female climacteric states: Secondary | ICD-10-CM | POA: Insufficient documentation

## 2021-11-15 DIAGNOSIS — C50912 Malignant neoplasm of unspecified site of left female breast: Secondary | ICD-10-CM | POA: Diagnosis not present

## 2021-11-15 DIAGNOSIS — E785 Hyperlipidemia, unspecified: Secondary | ICD-10-CM | POA: Insufficient documentation

## 2021-11-15 DIAGNOSIS — Z79811 Long term (current) use of aromatase inhibitors: Secondary | ICD-10-CM | POA: Diagnosis not present

## 2021-11-15 DIAGNOSIS — J449 Chronic obstructive pulmonary disease, unspecified: Secondary | ICD-10-CM | POA: Insufficient documentation

## 2021-11-15 DIAGNOSIS — I700 Atherosclerosis of aorta: Secondary | ICD-10-CM | POA: Diagnosis not present

## 2021-11-15 DIAGNOSIS — I2510 Atherosclerotic heart disease of native coronary artery without angina pectoris: Secondary | ICD-10-CM | POA: Diagnosis not present

## 2021-11-15 DIAGNOSIS — K219 Gastro-esophageal reflux disease without esophagitis: Secondary | ICD-10-CM | POA: Diagnosis not present

## 2021-11-15 LAB — CBC WITH DIFFERENTIAL/PLATELET
Abs Immature Granulocytes: 0.03 10*3/uL (ref 0.00–0.07)
Basophils Absolute: 0.1 10*3/uL (ref 0.0–0.1)
Basophils Relative: 2 %
Eosinophils Absolute: 0.6 10*3/uL — ABNORMAL HIGH (ref 0.0–0.5)
Eosinophils Relative: 7 %
HCT: 43.5 % (ref 36.0–46.0)
Hemoglobin: 13.5 g/dL (ref 12.0–15.0)
Immature Granulocytes: 0 %
Lymphocytes Relative: 32 %
Lymphs Abs: 2.6 10*3/uL (ref 0.7–4.0)
MCH: 27.5 pg (ref 26.0–34.0)
MCHC: 31 g/dL (ref 30.0–36.0)
MCV: 88.6 fL (ref 80.0–100.0)
Monocytes Absolute: 0.6 10*3/uL (ref 0.1–1.0)
Monocytes Relative: 8 %
Neutro Abs: 4.1 10*3/uL (ref 1.7–7.7)
Neutrophils Relative %: 51 %
Platelets: 282 10*3/uL (ref 150–400)
RBC: 4.91 MIL/uL (ref 3.87–5.11)
RDW: 13.9 % (ref 11.5–15.5)
WBC: 8.1 10*3/uL (ref 4.0–10.5)
nRBC: 0 % (ref 0.0–0.2)

## 2021-11-15 LAB — COMPREHENSIVE METABOLIC PANEL
ALT: 12 U/L (ref 0–44)
AST: 15 U/L (ref 15–41)
Albumin: 4.2 g/dL (ref 3.5–5.0)
Alkaline Phosphatase: 55 U/L (ref 38–126)
Anion gap: 8 (ref 5–15)
BUN: 14 mg/dL (ref 8–23)
CO2: 33 mmol/L — ABNORMAL HIGH (ref 22–32)
Calcium: 9.4 mg/dL (ref 8.9–10.3)
Chloride: 95 mmol/L — ABNORMAL LOW (ref 98–111)
Creatinine, Ser: 0.97 mg/dL (ref 0.44–1.00)
GFR, Estimated: 59 mL/min — ABNORMAL LOW (ref >60)
Glucose, Bld: 150 mg/dL — ABNORMAL HIGH (ref 70–99)
Potassium: 4.4 mmol/L (ref 3.5–5.1)
Sodium: 136 mmol/L (ref 135–145)
Total Bilirubin: 0.5 mg/dL (ref 0.3–1.2)
Total Protein: 7.3 g/dL (ref 6.5–8.1)

## 2021-11-15 NOTE — Progress Notes (Signed)
Hematology/Oncology follow up  note [facility] Telephone:([phone] Fax:([phone]) [phone]   Patient Care Team: Baxter Hire, MD as PCP - General (Internal Medicine) Earlie Server, MD as Consulting Physician (Hematology and Oncology) Breast Surgeon Dr.Sakai.   CHIEF COMPLAINTS/REASON FOR VISIT:  Follow up  of breast cancer  HISTORY OF PRESENTING ILLNESS:  Patient had mammogram and ultrasound on 03/19/2018 which showed right breast 12:00 1.6 x 1.3 x 1.5 cm mass, no radiographically enlarged adenopathy.  Patient also report feeling a mass for the past couple of months. 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  On 04/16/2018 she had a right breast mass lumpectomy and sentinel lymph node biopsy.  On 05/14/2018 had MRI breast bilaterally to determine extent of her breast cancer.  Pathology showed not enough tissue for HER2/neu analysis although she was ER/PR positive.  Had right deep margin lumpectomy reexcision on 07/03/2018 and pathology was negative for residual malignancy.  She had a series of admissions following her biopsies including pain control, biopsy site hematoma, acute on chronic anemia due to hematoma requiring PRBC transfusion, cellulitis with recurrent right breast abscess requiring IV antibiotics.  She did not require adjuvant chemotherapy and declined radiation due to toxicity risk.  She was placed on Arimidex(09/17/2018) which she tolerates well.  Patient has a history of chronic respiratory failure/ COPD, on home oxygen.  She had a mammogram on 10/23/2020 which showed non-mass enhancement along the lower inner quadrant of the left breast extending from the prior lumpectomy site anteriorly.  MRI was negative for malignancy involving the right breast.  She required a  biopsy of her left breast which showed high-grade DCIS.  Estrogen receptor was not checked on the specimen.  Case was discussed with surgery and mastectomy was recommended.  She had left mastectomy on 12/01/2020 and appeared to do well.  INTERVAL HISTORY [name] presents back today for routine follow-up.  Reports starting letrozole back in July/August and has noticed more joint pain, dizziness, hot flashes and headaches.  Symptoms have been intermittent although headaches appear to be worsening over the past 2 to 3 weeks.  She started taking Allegra which has not helped much.  Having trouble sleeping but this is a chronic problem for her.  Is followed closely by Dr. Raul Del for COPD.  Continues to use oxygen intermittently.  Breathing is stable.  She is due for a mammogram of right breast in the next few weeks.  Review of Systems  Constitutional: Negative.  Negative for chills, fever, malaise/fatigue and weight loss.  HENT:  Positive for congestion and sinus pain. Negative for ear pain and tinnitus.   Eyes: Negative.  Negative for blurred vision and double vision.  Respiratory:  Positive for shortness of breath. Negative for cough and sputum production.   Cardiovascular: Negative.  Negative for chest pain, palpitations and leg swelling.  Gastrointestinal: Negative.  Negative for abdominal pain, constipation, diarrhea, nausea and vomiting.  Genitourinary:  Negative for dysuria, frequency and urgency.  Musculoskeletal:  Positive for joint pain. Negative for back pain and falls.  Skin: Negative.  Negative for rash.  Neurological:  Positive for dizziness and headaches. Negative for weakness.  Endo/Heme/Allergies: Negative.  Does not bruise/bleed easily.  Psychiatric/Behavioral:  Negative for depression. The patient has insomnia. The patient is not nervous/anxious.    MEDICAL HISTORY:  Past Medical  History:  Diagnosis Date   Allergy    Aortic atherosclerosis ([place])    Breast cancer ([place])  05/2018   Right breast found 1st, then left breast with ductal ca   Cellulitis of breast 05/2018   right breast cellulitis after first biopsy,  antibiotics prescribed   COPD (chronic obstructive pulmonary disease) ([facility])    Coronary atherosclerosis    Diabetes mellitus without complication ([facility])    Dyspnea    GERD (gastroesophageal reflux disease)    occasionally   History of kidney stones    HLD (hyperlipidemia)    Hypertension    Hypothyroidism    Oxygen dependent    2liter nasal prong continuously   Pneumonia    Rheumatoid arthritis ([facility])    RLS (restless legs syndrome)     SURGICAL HISTORY: Past Surgical History:  Procedure Laterality Date   APPENDECTOMY     BACK SURGERY     lumbar. herniated disc . no metal   BREAST BIOPSY Right 03/19/2018   INVASIVE MAMMARY CARCINOMA, NO SPECIAL TYPE   BREAST BIOPSY Left 06/01/2018   MRI bx, grade III invasive ductal carcinoma   BREAST BIOPSY Right 06/01/2018   MRI bx of enhancing mass, path showed FOREIGN BODY GRANULOMATOUS RESPONSE    BREAST LUMPECTOMY Right 04/16/2018   [facility], DCIS, LN negative   BREAST LUMPECTOMY Right 07/03/2018   Procedure: RE-EXCISION OF RIGHT BREAST TISSUE MASS;  Surgeon: Benjamine Sprague, DO;  Location: [facility];  Service: General;  Laterality: Right;   BREAST LUMPECTOMY Left 06/2018   invasive ductal   IRRIGATION AND DEBRIDEMENT HEMATOMA Left 07/10/2018   Procedure: IRRIGATION AND DEBRIDEMENT HEMATOMA LEFT AXILLARY;  Surgeon: Benjamine Sprague, DO;  Location: [facility];  Service: General;  Laterality: Left;   PARTIAL MASTECTOMY WITH NEEDLE LOCALIZATION AND AXILLARY SENTINEL LYMPH NODE BX Right 04/16/2018   Procedure: PARTIAL MASTECTOMY WITH NEEDLE LOCALIZATION AND AXILLARY SENTINEL LYMPH NODE BX;  Surgeon: Benjamine Sprague, DO;  Location: [facility];  Service: General;  Laterality: Right;   PARTIAL MASTECTOMY WITH NEEDLE LOCALIZATION AND AXILLARY SENTINEL LYMPH NODE BX Left 07/03/2018   Procedure: PARTIAL MASTECTOMY WITH NEEDLE  LOCALIZATION AND SENTINEL LYMPH NODE BX;  Surgeon: Benjamine Sprague, DO;  Location: [facility];  Service: General;  Laterality: Left;   TOTAL MASTECTOMY Left 12/01/2020   Procedure: TOTAL MASTECTOMY;  Surgeon: Benjamine Sprague, DO;  Location: [facility];  Service: General;  Laterality: Left;    SOCIAL HISTORY: Social History   Socioeconomic History   Marital status: Married    Spouse name: richard..disabled   Number of children: Not on file   Years of education: Not on file   Highest education level: Not on file  Occupational History   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  Tobacco Use   Smoking status: Former    Types: Cigarettes    Quit date: 03/30/1993    Years since quitting: 28.6   Smokeless tobacco: Never  Vaping Use   Vaping Use: Never used  Substance and Sexual Activity   Alcohol use: No   Drug use: Never   Sexual activity: Not Currently  Other Topics Concern   Not on file  Social History Narrative   Not on file   Social Determinants of Health   Financial Resource Strain: Not on file  Food Insecurity: Not on file  Transportation Needs: Not on file  Physical Activity: Not on file  Stress: Not on file  Social Connections: Not on file  Intimate Partner Violence:  Not on file    FAMILY HISTORY: Family History  Problem Relation Age of Onset   Clotting disorder Mother    Melanoma Mother        deceased 43   Lung cancer Sister        deceased 46s; smoker   Alcohol abuse Father        deceased 76   Cervical cancer Other        neice   Breast cancer Neg Hx     ALLERGIES:  is allergic to leflunomide and sulfa antibiotics.  MEDICATIONS:  Current Outpatient Medications  Medication Sig Dispense Refill   azelastine (ASTELIN) 0.1 % nasal spray Place 1 spray into both nostrils 2 (two) times daily.     budesonide-formoterol (SYMBICORT) 160-4.5 MCG/ACT inhaler Inhale 2 puffs into the lungs 2 (two) times daily.      Cholecalciferol (VITAMIN D3) 50 MCG (2000  UT) capsule TAKE 1 TABLET BY MOUTH EVERY DAY 90 capsule 0   fluticasone (FLONASE) 50 MCG/ACT nasal spray Place 2 sprays into both nostrils daily as needed for allergies.     folic acid (FOLVITE) 1 MG tablet Take 1 mg by mouth daily.     glipiZIDE (GLUCOTROL XL) 5 MG 24 hr tablet Take 5 mg by mouth daily.     HYDROcodone-acetaminophen (NORCO/VICODIN) 5-325 MG tablet Take 1-2 tablets by mouth every 4 (four) hours as needed for moderate pain. 20 tablet 0   hydroxychloroquine (PLAQUENIL) 200 MG tablet Take 400 mg by mouth daily.      Ipratropium-Albuterol (COMBIVENT) 20-100 MCG/ACT AERS respimat Inhale 1 puff into the lungs every 6 (six) hours as needed for wheezing or shortness of breath.     irbesartan (AVAPRO) 75 MG tablet Take 75 mg by mouth daily.     letrozole (FEMARA) 2.5 MG tablet Take 1 tablet (2.5 mg total) by mouth daily. 90 tablet 2   levothyroxine (SYNTHROID, LEVOTHROID) 50 MCG tablet Take 50 mcg by mouth daily before breakfast.     methotrexate (RHEUMATREX) 2.5 MG tablet Take by mouth. Take 4 tablets (10 mg total) by mouth every 7 (seven) days     omeprazole (PRILOSEC) 20 MG capsule Take 20 mg by mouth daily as needed.     No current facility-administered medications for this visit.     PHYSICAL EXAMINATION: ECOG PERFORMANCE STATUS: 1 - Symptomatic but completely ambulatory Vitals:   11/15/21 1435  BP: 137/61  Pulse: (!) 108  Resp: 16  Temp: 98.7 F (37.1 C)  SpO2: 91%    Filed Weights   11/15/21 1435  Weight: 142 lb 4.8 oz (64.5 kg)     Physical Exam Constitutional:      Appearance: Normal appearance.  HENT:     Head: Normocephalic and atraumatic.  Eyes:     Pupils: Pupils are equal, round, and reactive to light.  Cardiovascular:     Rate and Rhythm: Normal rate and regular rhythm.     Heart sounds: Normal heart sounds. No murmur heard. Pulmonary:     Effort: Pulmonary effort is normal.     Breath sounds: Normal breath sounds. No wheezing.  Chest:   Breasts:    Right: Normal.     Left: Absent.  Abdominal:     General: Bowel sounds are normal. There is no distension.     Palpations: Abdomen is soft.     Tenderness: There is no abdominal tenderness.  Musculoskeletal:        General: Normal range of motion.  Cervical back: Normal range of motion.  Skin:    General: Skin is warm and dry.     Findings: No rash.  Neurological:     Mental Status: She is alert and oriented to person, place, and time.  Psychiatric:        Judgment: Judgment normal.      LABORATORY DATA:  I have reviewed the data as listed Lab Results  Component Value Date   WBC 8.1 11/15/2021   HGB 13.5 11/15/2021   HCT 43.5 11/15/2021   MCV 88.6 11/15/2021   PLT 282 11/15/2021   Recent Labs    12/02/20 0608 04/11/21 1406 11/15/21 1411  NA 139 137 136  K 4.5 4.0 4.4  CL 102 98 95*  CO2 30 32 33*  GLUCOSE 71 163* 150*  BUN _0 CREATININE 0.70 0.96 0.97  CALCIUM 8.8* 9.1 9.4  GFRNONAA >60 >60 59*  PROT  --  7.4 7.3  ALBUMIN  --  4.3 4.2  AST  --  17 15  ALT  --  12 12  ALKPHOS  --  55 55  BILITOT  --  0.4 0.5    Iron/TIBC/Ferritin/ %Sat No results found for: IRON, TIBC, FERRITIN, IRONPCTSAT      ASSESSMENT & PLAN:  No diagnosis found.  Clinically she is doing well.  She is due for a mammogram of her right breast which she will get scheduled here in the next few weeks.  No evidence of recurrence on today's breast exam.  She is status post mastectomy of left breast on 12/01/2020 which showed ER positive PR negative breast cancer.  She was switched from an aromatase inhibitor to letrozole.  She started that in July/August.  Has had some intermittent symptoms since starting letrozole but unclear if related.  Headaches-appears to be worsening over the past 2 weeks.  Recommend continuing antihistamine and add in Flonase 2 sprays each nostril daily.  If no improvement in the next few weeks, encouraged her to let us  know.  Insomnia-appears to be chronic for her follow-up with PCP.  Dizziness-appears to happen with changing positions.  Recommend changing positions slowly.  Joint pain-has history of osteoarthritis but letrozole could exacerbate symptoms.  Continue to monitor.  Hot flashes-postmenopausal.  Happens infrequently.  Disposition- Mammogram in the next few weeks. Continue allergy medication for 1 to 2 weeks to see if headaches improve. If no improvement could consider stopping letrozole to see if her symptoms improve and possibly switch to a different aromatase inhibitor. Return to clinic in 6 months for follow-up with Dr. Tasia Catchings.   I spent 15 minutes dedicated to the care of this patient (face-to-face and non-face-to-face) on the date of the encounter to include what is described in the assessment and plan.  Faythe Casa, NP 11/15/2021 2:46 PM

## 2021-11-15 NOTE — Progress Notes (Signed)
Patient states she is on oxygen at home. Patient states she is having more aches and pains. Patient states "her bones hurt".  ?

## 2021-11-20 ENCOUNTER — Other Ambulatory Visit: Payer: Self-pay

## 2021-11-20 ENCOUNTER — Ambulatory Visit
Admission: RE | Admit: 2021-11-20 | Discharge: 2021-11-20 | Disposition: A | Discharge status: Home / Self Care | Payer: PPO | Source: Ambulatory Visit | Attending: Oncology | Admitting: Oncology

## 2021-11-20 DIAGNOSIS — Z1231 Encounter for screening mammogram for malignant neoplasm of breast: Secondary | ICD-10-CM | POA: Diagnosis not present

## 2021-11-20 DIAGNOSIS — Z853 Personal history of malignant neoplasm of breast: Secondary | ICD-10-CM | POA: Diagnosis not present

## 2021-11-20 DIAGNOSIS — C50919 Malignant neoplasm of unspecified site of unspecified female breast: Secondary | ICD-10-CM

## 2021-11-28 ENCOUNTER — Ambulatory Visit: Admit: 2021-11-28 | Payer: PPO | Admitting: Ophthalmology

## 2021-11-28 SURGERY — PHACOEMULSIFICATION, CATARACT, WITH IOL INSERTION
Anesthesia: Topical | Laterality: Right

## 2021-12-03 DIAGNOSIS — Z79899 Other long term (current) drug therapy: Secondary | ICD-10-CM | POA: Diagnosis not present

## 2021-12-03 DIAGNOSIS — M0579 Rheumatoid arthritis with rheumatoid factor of multiple sites without organ or systems involvement: Secondary | ICD-10-CM | POA: Diagnosis not present

## 2021-12-03 DIAGNOSIS — M79674 Pain in right toe(s): Secondary | ICD-10-CM | POA: Diagnosis not present

## 2021-12-03 DIAGNOSIS — J449 Chronic obstructive pulmonary disease, unspecified: Secondary | ICD-10-CM | POA: Diagnosis not present

## 2021-12-03 DIAGNOSIS — C50512 Malignant neoplasm of lower-outer quadrant of left female breast: Secondary | ICD-10-CM | POA: Diagnosis not present

## 2021-12-12 ENCOUNTER — Ambulatory Visit: Admit: 2021-12-12 | Payer: PPO | Admitting: Ophthalmology

## 2021-12-12 SURGERY — PHACOEMULSIFICATION, CATARACT, WITH IOL INSERTION
Anesthesia: Topical | Laterality: Left

## 2021-12-14 ENCOUNTER — Other Ambulatory Visit: Payer: Self-pay | Admitting: Oncology

## 2022-01-03 DIAGNOSIS — J449 Chronic obstructive pulmonary disease, unspecified: Secondary | ICD-10-CM | POA: Diagnosis not present

## 2022-01-13 ENCOUNTER — Other Ambulatory Visit: Payer: Self-pay | Admitting: Oncology

## 2022-02-02 DIAGNOSIS — J449 Chronic obstructive pulmonary disease, unspecified: Secondary | ICD-10-CM | POA: Diagnosis not present

## 2022-03-05 DIAGNOSIS — J449 Chronic obstructive pulmonary disease, unspecified: Secondary | ICD-10-CM | POA: Diagnosis not present

## 2022-03-26 DIAGNOSIS — E119 Type 2 diabetes mellitus without complications: Secondary | ICD-10-CM | POA: Diagnosis not present

## 2022-03-26 DIAGNOSIS — E034 Atrophy of thyroid (acquired): Secondary | ICD-10-CM | POA: Diagnosis not present

## 2022-04-01 DIAGNOSIS — Z0001 Encounter for general adult medical examination with abnormal findings: Secondary | ICD-10-CM | POA: Diagnosis not present

## 2022-04-01 DIAGNOSIS — J449 Chronic obstructive pulmonary disease, unspecified: Secondary | ICD-10-CM | POA: Diagnosis not present

## 2022-04-01 DIAGNOSIS — E7800 Pure hypercholesterolemia, unspecified: Secondary | ICD-10-CM | POA: Diagnosis not present

## 2022-04-01 DIAGNOSIS — E119 Type 2 diabetes mellitus without complications: Secondary | ICD-10-CM | POA: Diagnosis not present

## 2022-04-01 DIAGNOSIS — E034 Atrophy of thyroid (acquired): Secondary | ICD-10-CM | POA: Diagnosis not present

## 2022-04-01 DIAGNOSIS — J9611 Chronic respiratory failure with hypoxia: Secondary | ICD-10-CM | POA: Diagnosis not present

## 2022-04-01 DIAGNOSIS — M059 Rheumatoid arthritis with rheumatoid factor, unspecified: Secondary | ICD-10-CM | POA: Diagnosis not present

## 2022-04-01 DIAGNOSIS — D0512 Intraductal carcinoma in situ of left breast: Secondary | ICD-10-CM | POA: Diagnosis not present

## 2022-04-01 DIAGNOSIS — I10 Essential (primary) hypertension: Secondary | ICD-10-CM | POA: Diagnosis not present

## 2022-04-04 DIAGNOSIS — J449 Chronic obstructive pulmonary disease, unspecified: Secondary | ICD-10-CM | POA: Diagnosis not present

## 2022-04-27 ENCOUNTER — Other Ambulatory Visit: Payer: Self-pay | Admitting: Oncology

## 2022-05-05 DIAGNOSIS — J449 Chronic obstructive pulmonary disease, unspecified: Secondary | ICD-10-CM | POA: Diagnosis not present

## 2022-05-22 ENCOUNTER — Encounter: Payer: Self-pay | Admitting: Oncology

## 2022-05-22 ENCOUNTER — Inpatient Hospital Stay: Payer: PPO | Attending: Oncology | Admitting: Oncology

## 2022-05-22 VITALS — BP 117/53 | HR 84 | Temp 99.5°F | Resp 18 | Wt 140.9 lb

## 2022-05-22 DIAGNOSIS — Z9013 Acquired absence of bilateral breasts and nipples: Secondary | ICD-10-CM | POA: Insufficient documentation

## 2022-05-22 DIAGNOSIS — Z170 Estrogen receptor positive status [ER+]: Secondary | ICD-10-CM | POA: Insufficient documentation

## 2022-05-22 DIAGNOSIS — R2231 Localized swelling, mass and lump, right upper limb: Secondary | ICD-10-CM | POA: Diagnosis not present

## 2022-05-22 DIAGNOSIS — Z79811 Long term (current) use of aromatase inhibitors: Secondary | ICD-10-CM | POA: Insufficient documentation

## 2022-05-22 DIAGNOSIS — C50911 Malignant neoplasm of unspecified site of right female breast: Secondary | ICD-10-CM | POA: Insufficient documentation

## 2022-05-22 DIAGNOSIS — Z853 Personal history of malignant neoplasm of breast: Secondary | ICD-10-CM

## 2022-05-22 DIAGNOSIS — M8580 Other specified disorders of bone density and structure, unspecified site: Secondary | ICD-10-CM | POA: Diagnosis not present

## 2022-05-22 DIAGNOSIS — C50912 Malignant neoplasm of unspecified site of left female breast: Secondary | ICD-10-CM | POA: Diagnosis not present

## 2022-05-22 DIAGNOSIS — N6315 Unspecified lump in the right breast, overlapping quadrants: Secondary | ICD-10-CM | POA: Insufficient documentation

## 2022-05-22 NOTE — Progress Notes (Signed)
Pt here for follow up. Reports chronic pain to breasts.

## 2022-05-22 NOTE — Progress Notes (Signed)
Hematology/Oncology follow up  note [facility] Telephone:(336) [phone] Fax:(336) [phone]   Patient Care Team: Baxter Hire, MD as PCP - General (Internal Medicine) Earlie Server, MD as Consulting Physician (Hematology and Oncology) Breast Surgeon Dr.Sakai.   CHIEF COMPLAINTS/REASON FOR VISIT:  Follow up  of breast cancer  HISTORY OF PRESENTING ILLNESS:  Amy Woodard is a  81 y.o.  female with PMH listed below who was referred to me for evaluation of newly diagnosed breast cancer. Patient had mammogram and ultrasound on 03/19/2018 which showed right breast 12:00 1.6 x 1.3 x 1.5 cm mass, no radiographically enlarged adenopathy.  Patient also report feeling a mass for the past couple of months. Biopsy pathology showed: Invasive mammary carcinoma, no special type, grade 3, DCIS present, lymphovascular invasion not identified, ER PR 90% positive, HER-2 negative  Nipple discharge: Denies Family history: Mother had melanoma, passed away at age of 60, sister had cervical cancer.  Sister with colon cancer, maternal grandmother sister OCP use: denies.  Estrogen and progesterone therapy: denies History of radiation to chest: denies.  Previous breast surgery: Denies  She has a complicated course of rest cancer diagnosis and surgery. # 04/16/2018 status post right breast mass lumpectomy and sentinel lymph node biopsy.  Pathology showed invasive mammary carcinoma 1.7 cm, DCIS, high-grade with extensive involvement of lobules.  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  Sentinel lymph node biopsy negative. pT1c pN0. ER >90%, PR >90%, HER 2 negative.   05/14/2018 patient underwent MRI breast bilaterally to determined extent of positive deep margin/lobular type breast cancer. MRI breast showed an irregular 1.4 cm mass in the lower inner quadrant of the left breast. Abnormal non-mass enhancement in the upper outer right breast, axillary tail  region could be secondary to postsurgical changes, malignancy cannot be excluded. 06/05/2018 left breast mass biopsy showed invasive ductal carcinoma, grade 3, [facility] ER 100%, PR 50%, not enough tissue for HER 2 analysis.  07/03/2018 patient underwent right breast deep margin lumpectomy/reexcision: Pathology showed negative for residual malignancy. Left breast needle localized lumpectomy showed negative for residual malignancy.  Sentinel lymph node negative In addition the outer slides on this patient's left breast biopsy were requested and reviewed by Dr. RReuel Derby  Dr. RReuel Derbyfeels the patient's biopsy showed high-grade DCIS with focal areas suspicious for invasion.  Definitive invasive carcinoma is not identified.  DCIS is present in both outside blocks and slides from biopsy and measures 7 mm and 6 mm respectively.  pTis pN0 Patient case have been discussed on breast tumor board multiple times with reviewing of images and pathology findings. Left breast MRI did show a 1.4 cm irregularity, and pathology only 7 mm and a 6 mm DCIS were found at the initial left breast biopsy.  Final left breast lumpectomy did not contain clips which was placed with MRI guidance at biopsy.. Per my discussion with surgeon Dr.Sakai, even clips are found on repeat image, re-excision is not feasible and the next step will be mastectomy.   # 07/03/2018-07/06/2018 admitted for pain control, sentinel lymph node biopsy site hematoma,  #07/10/2018 - 07/14/2018 Patient admitted due to acute on chronic anemia due to hematoma.  She was admitted for evacuation of postoperative left axillary sentinel lymph node biopsy site hematoma.  Status post PRBC transfusion during her admission. # 08/14/2018-08/17/2018 readmitted due to breast cellulitis despite outpatient drainage and oral antibiotics.  #  08/24/2018- 08/28/2018 readmitted due to recurrent right breast abscess. She was  placed on IV antibiotics with Ceftriaxone via picc line.  #A repeat US has been done on 09/07/2018 and it shows near resolution of the fluid. # #Finished IV Ceftriaxone on 09/11/2018.   Patient has a history of chronic respiratory failure/ COPD, on home oxygen.  # Right breast pT1c pN0, reexcision did not reveal any residual disease.. Oncotype DX showed recurrence rate of 31, patient has 15% benefit from adjuvant chemotherapy.  Decision was made not to proceed with adjuvant chemotherapy due to significant delays secondary to surgical complication, slow recovery from surgery, borderline performance status and comorbidities.  Patient declined radiation due to severe lung condition and is afraid of lung toxicities.[name]  #Left breast high-grade DCIS She has been tolerating antiestrogen treatment with Arimidex very well.  Manageable side effects. left breast biopsy clip was not found in the specimen. Continue Arimidex, tolerates well.   10/27/2018 left unilateral diagnostic mammogram showed left breast lower inner quadrant clip lstill presents results was discussed with patient and patient's surgeon Dr. Lysle Pearl.  Dr. Lysle Pearl feels the only option will be mastectomy. Discussed with patient that there is chance of remaining cancer in her left breast in the area where the clip was placed. Patient is not interested in additional breast surgeries.  Continue antiestrogen treatments and surveillance mammogram.  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 Recommend patient to follow-up with primary care provider.  If it continues to bother her she needs to see a surgery for evaluation and resection.  #09/17/2018 Started on ArimideX # 10/23/2020, MRI breast bilaterally showed indeterminate stippled linear non-mass enhancement involving the lower inner quadrant of the left breast extending from the prior lumpectomy site anteriorly, spanning 2.5 cm.  Indeterminate 0.7 cm mass involving the  lower outer quadrant of the left breast at a posterior depth.  Indeterminate 0.9 cm masslike enhancement involving the right middle lobe laterally.  No pathologic lymphadenopathy.  No MRI evidence of malignancy involving the right breast.  # 10/30/2020  biopsy of the linear none mass enhancement in the lower inner posterior left breast.  The MRI biopsy of the second edition was unable to be performed Pathology showed high-grade DCIS of the left breast, the other smaller mass left outer quadrant not able to biopsied. Addendum, Hormone receptor status was added to her 10/30/2020 , results showed ER 95%, PR 0% She is currently on Arimidex and I recommend patient to switch to other AI.   12/01/2020, left mastectomy with sentinel lymph node biopsy. Focal atypical ductal hyperplasia, background benign mammary parenchyma with fat necrosis.  Scar, and reactive keratinizing squamous metaplasia compatible with prior procedure site.  Clips x2 and biopsy sites identified.  Benign nipple/areolar complex.  Incidental seborrheic keratosis negative for definite DCIS and malignancy. 1 sentinel lymph nodes were negative   11/07/2020 CT chest wo contrast showed no evidence of metastatic disease within the thorax.  No masses or pathologically enlarged lymph nodes identified on this unenhanced exam.  Patient has multiple tiny calcified granulomas noted in the right upper lobe.  Mild scarring is seen in the right upper and middle lobes and inferior lingula.  No suspicious nodules or masses identified.  04/20/2021, given that patient developed recurrence while on Arimidex.  AI was switched to letrozole.   INTERVAL HISTORY Amy Woodard is a 81 y.o. female who has above history reviewed by me today presents for follow up visit for management  of breast cancer. Patient tolerates letrozole well.  Manageable side effects. Chronic shortness of breath, unchanged.  Today patient reports feeling well. Patient has no new breast  concerns except right axillary small sized round lesion she has noticed it for couple of months.  Review of Systems  Constitutional:  Negative for chills, fever, malaise/fatigue and weight loss.  HENT:  Negative for nosebleeds and sore throat.   Eyes:  Negative for double vision, photophobia and redness.  Respiratory:  Positive for shortness of breath. Negative for cough and wheezing.   Cardiovascular:  Negative for chest pain, palpitations, orthopnea and leg swelling.  Gastrointestinal:  Negative for abdominal pain, blood in stool, nausea and vomiting.  Genitourinary:  Negative for dysuria.  Musculoskeletal:  Negative for myalgias.  Skin:  Negative for itching and rash.  Neurological:  Negative for dizziness, tingling and tremors.  Endo/Heme/Allergies:  Negative for environmental allergies. Does not bruise/bleed easily.  Psychiatric/Behavioral:  Negative for depression and hallucinations.     MEDICAL HISTORY:  Past Medical History:  Diagnosis Date   Allergy    Aortic atherosclerosis ([place])    Breast cancer ([place]) 05/2018   Right breast found 1st, then left breast with ductal ca   Cellulitis of breast 05/2018   right breast cellulitis after first biopsy,  antibiotics prescribed   COPD (chronic obstructive pulmonary disease) ([facility])    Coronary atherosclerosis    Diabetes mellitus without complication ([facility])    Dyspnea    GERD (gastroesophageal reflux disease)    occasionally   History of kidney stones    HLD (hyperlipidemia)    Hypertension    Hypothyroidism    Oxygen dependent    2liter nasal prong continuously   Pneumonia    Rheumatoid arthritis (H[facility])    RLS (restless legs syndrome)     SURGICAL HISTORY: Past Surgical History:  Procedure Laterality Date   APPENDECTOMY     BACK SURGERY     lumbar. herniated disc . no metal   BREAST BIOPSY Right 03/19/2018   INVASIVE MAMMARY CARCINOMA, NO SPECIAL TYPE   BREAST BIOPSY Left 06/01/2018   MRI bx, grade III invasive ductal  carcinoma   BREAST BIOPSY Right 06/01/2018   MRI bx of enhancing mass, path showed FOREIGN BODY GRANULOMATOUS RESPONSE    BREAST LUMPECTOMY Right 04/16/2018   IM[facility], DCIS, LN negative   BREAST LUMPECTOMY Right 07/03/2018   Procedure: RE-EXCISION OF RIGHT BREAST TISSUE MASS;  Surgeon: Benjamine Sprague, DO;  Location: [facility] ORS;  Service: General;  Laterality: Right;   BREAST LUMPECTOMY Left 06/2018   invasive ductal   IRRIGATION AND DEBRIDEMENT HEMATOMA Left 07/10/2018   Procedure: IRRIGATION AND DEBRIDEMENT HEMATOMA LEFT AXILLARY;  Surgeon: Benjamine Sprague, DO;  Location: [facility] ORS;  Service: General;  Laterality: Left;   PARTIAL MASTECTOMY WITH NEEDLE LOCALIZATION AND AXILLARY SENTINEL LYMPH NODE BX Right 04/16/2018   Procedure: PARTIAL MASTECTOMY WITH NEEDLE LOCALIZATION AND AXILLARY SENTINEL LYMPH NODE BX;  Surgeon: Benjamine Sprague, DO;  Location: [facility] ORS;  Service: General;  Laterality: Right;   PARTIAL MASTECTOMY WITH NEEDLE LOCALIZATION AND AXILLARY SENTINEL LYMPH NODE BX Left 07/03/2018   Procedure: PARTIAL MASTECTOMY WITH NEEDLE LOCALIZATION AND SENTINEL LYMPH NODE BX;  Surgeon: Benjamine Sprague, DO;  Location: [facility] ORS;  Service: General;  Laterality: Left;   TOTAL MASTECTOMY Left 12/01/2020   Procedure: TOTAL MASTECTOMY;  Surgeon: Benjamine Sprague, DO;  Location: [facility] ORS;  Service: General;  Laterality: Left;    SOCIAL HISTORY: Social History   Socioeconomic History   Marital  status: Married    Spouse name: richard..disabled   Number of children: Not on file   Years of education: Not on file   Highest education level: Not on file  Occupational History   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  Tobacco Use   Smoking status: Former    Types: Cigarettes    Quit date: 03/30/1993    Years since quitting: 29.1   Smokeless tobacco: Never  Vaping Use   Vaping Use: Never used  Substance and Sexual Activity   Alcohol use: No   Drug use: Never   Sexual activity: Not Currently  Other  Topics Concern   Not on file  Social History Narrative   Not on file   Social Determinants of Health   Financial Resource Strain: [place]  (07/03/2018)   Overall Financial Resource Strain (CARDIA)    Difficulty of Paying Living Expenses: Not hard at all  Food Insecurity: No Food Insecurity (07/03/2018)   Hunger Vital Sign    Worried About Running Out of Food in the Last Year: Never true    [place] in the Last Year: Never true  Transportation Needs: No Transportation Needs (07/03/2018)   PRAPARE - Hydrologist (Medical): No    Lack of Transportation (Non-Medical): No  Physical Activity: Sufficiently Active (07/03/2018)   Exercise Vital Sign    Days of Exercise per Week: 7 days    Minutes of Exercise per Session: 30 min  Stress: No Stress Concern Present (07/03/2018)   [place]    Feeling of Stress : Not at all  Social Connections: Moderately Integrated (07/03/2018)   Social Connection and Isolation Panel [NHANES]    Frequency of Communication with Friends and Family: More than three times a week    Frequency of Social Gatherings with Friends and Family: More than three times a week    Attends Religious Services: 1 to 4 times per year    Active Member of [employer] or Organizations: No    Attends Archivist Meetings: Never    Marital Status: Married  Human resources officer Violence: Not At Risk (07/03/2018)   Humiliation, Afraid, Rape, and Kick questionnaire    Fear of Current or Ex-Partner: No    Emotionally Abused: No    Physically Abused: No    Sexually Abused: No    FAMILY HISTORY: Family History  Problem Relation Age of Onset   Clotting disorder Mother    Melanoma Mother        deceased 50   Lung cancer Sister        deceased 97s; smoker   Alcohol abuse Father        deceased 67   Cervical cancer Other        neice   Breast cancer Neg Hx     ALLERGIES:  is  allergic to leflunomide and sulfa antibiotics.  MEDICATIONS:  Current Outpatient Medications  Medication Sig Dispense Refill   azelastine (ASTELIN) 0.1 % nasal spray Place 1 spray into both nostrils 2 (two) times daily.     budesonide-formoterol (SYMBICORT) 160-4.5 MCG/ACT inhaler Inhale 2 puffs into the lungs 2 (two) times daily.      Cholecalciferol (VITAMIN D3) 50 MCG (2000 UT) capsule TAKE 1 CAPSULE BY MOUTH EVERY DAY 100 capsule 1   fluticasone (FLONASE) 50 MCG/ACT nasal spray Place 2 sprays into both nostrils daily as needed for allergies.  folic acid (FOLVITE) 1 MG tablet Take 1 mg by mouth daily.     glipiZIDE (GLUCOTROL XL) 5 MG 24 hr tablet Take 5 mg by mouth daily.     HYDROcodone-acetaminophen (NORCO/VICODIN) 5-325 MG tablet Take 1-2 tablets by mouth every 4 (four) hours as needed for moderate pain. 20 tablet 0   hydroxychloroquine (PLAQUENIL) 200 MG tablet Take 400 mg by mouth daily.      Ipratropium-Albuterol (COMBIVENT) 20-100 MCG/ACT AERS respimat Inhale 1 puff into the lungs every 6 (six) hours as needed for wheezing or shortness of breath.     irbesartan (AVAPRO) 75 MG tablet Take 75 mg by mouth daily.     letrozole (FEMARA) 2.5 MG tablet TAKE 1 TABLET BY MOUTH EVERY DAY 90 tablet 2   levothyroxine (SYNTHROID, LEVOTHROID) 50 MCG tablet Take 50 mcg by mouth daily before breakfast.     methotrexate (RHEUMATREX) 2.5 MG tablet Take by mouth. Take 4 tablets (10 mg total) by mouth every 7 (seven) days     omeprazole (PRILOSEC) 20 MG capsule Take 20 mg by mouth daily as needed.     No current facility-administered medications for this visit.     PHYSICAL EXAMINATION: ECOG PERFORMANCE STATUS: 1 - Symptomatic but completely ambulatory Vitals:   05/22/22 1340  BP: (!) 117/53  Pulse: 84  Resp: 18  Temp: 99.5 F (37.5 C)   Filed Weights   05/22/22 1340  Weight: 140 lb 14.4 oz (63.9 kg)    Physical Exam Constitutional:      General: She is not in acute distress.     Comments: On home oxygen  HENT:     Head: Normocephalic and atraumatic.  Eyes:     General: No scleral icterus.    Conjunctiva/sclera: Conjunctivae normal.     Pupils: Pupils are equal, round, and reactive to light.  Cardiovascular:     Rate and Rhythm: Normal rate and regular rhythm.     Heart sounds: Normal heart sounds.  Pulmonary:     Effort: Pulmonary effort is normal. No respiratory distress.     Comments: Decreased breath sound bilaterally Abdominal:     General: Bowel sounds are normal. There is no distension.     Palpations: Abdomen is soft. There is no mass.     Tenderness: There is no abdominal tenderness.  Musculoskeletal:        General: No deformity. Normal range of motion.     Cervical back: Normal range of motion and neck supple.  Lymphadenopathy:     Cervical: No cervical adenopathy.  Skin:    General: Skin is warm and dry.     Findings: No erythema or rash.     Comments: Left side posterior upper chest wall soft tissue nodule previously worked up which is likely a lipoma  Neurological:     General: No focal deficit present.     Mental Status: She is alert and oriented to person, place, and time.     Cranial Nerves: No cranial nerve deficit.     Coordination: Coordination normal.  Psychiatric:        Mood and Affect: Mood normal.    Breast exam was performed in seated  position. Patient is status post previous right lumpectomy.  Status post recent left mastectomy.   No palpable chest wall mass.  No palpable right breast mass. There is a round cystic lesion of right axillary.     LABORATORY DATA:  I have reviewed the data as listed  Latest Ref Rng & Units 11/15/2021    2:11 PM 04/11/2021    2:06 PM 12/02/2020    6:08 AM  CBC  WBC 4.0 - 10.5 K/uL 8.1  7.9  10.6   Hemoglobin 12.0 - 15.0 g/dL 13.5  13.3  11.6   Hematocrit 36.0 - 46.0 % 43.5  42.9  37.2   Platelets 150 - 400 K/uL 282  288  215       Latest Ref Rng & Units 11/15/2021    2:11 PM 04/11/2021     2:06 PM 12/02/2020    6:08 AM  CMP  Glucose 70 - 99 mg/dL 150  163  71   BUN 8 - 23 mg/dL 14  19  13    Creatinine 0.44 - 1.00 mg/dL 0.97  0.96  0.70   Sodium 135 - 145 mmol/L 136  137  139   Potassium 3.5 - 5.1 mmol/L 4.4  4.0  4.5   Chloride 98 - 111 mmol/L 95  98  102   CO2 22 - 32 mmol/L 33  32  30   Calcium 8.9 - 10.3 mg/dL 9.4  9.1  8.8   Total Protein 6.5 - 8.1 g/dL 7.3  7.4    Total Bilirubin 0.3 - 1.2 mg/dL 0.5  0.4    Alkaline Phos 38 - 126 U/L 55  55    AST 15 - 41 U/L 15  17    ALT 0 - 44 U/L 12  12        ASSESSMENT & PLAN:  1. History of invasive breast cancer   2. Axillary mass, right   3. Osteopenia, unspecified location   4. Aromatase inhibitor use    #Right breast invasive carcinoma [04/2018- lumpectomy],   left breast high-grade DCIS [05/2018-lumpectomy] -mammographic occult malignancy Recurrent left breast high-grade DCIS [10/2020 biopsy- 12/01/20-left mastectomy] no residual DCIS or invasive carcinoma. Continue letrozole 2.5 mg daily.  Refills sent. Obtain annual right mammogram. Right axilla cystic lesion, obtain ultrasound axilla for further evaluation.  #Osteopenia, last DEXA was more than 2 years ago.  Recommend calcium 1200 mg daily and vitamin D supplementation. Patient has previously declined bisphosphonate.    Follow-up in 6 months  All questions were answered. The patient knows to call the clinic with any problems questions or concerns.  Earlie Server, MD, PhD 05/22/2022

## 2022-06-05 DIAGNOSIS — M0579 Rheumatoid arthritis with rheumatoid factor of multiple sites without organ or systems involvement: Secondary | ICD-10-CM | POA: Diagnosis not present

## 2022-06-05 DIAGNOSIS — Z79899 Other long term (current) drug therapy: Secondary | ICD-10-CM | POA: Diagnosis not present

## 2022-06-05 DIAGNOSIS — J449 Chronic obstructive pulmonary disease, unspecified: Secondary | ICD-10-CM | POA: Diagnosis not present

## 2022-06-07 ENCOUNTER — Ambulatory Visit
Admission: RE | Admit: 2022-06-07 | Discharge: 2022-06-07 | Disposition: A | Discharge status: Home / Self Care | Payer: PPO | Source: Ambulatory Visit | Attending: Oncology | Admitting: Oncology

## 2022-06-07 ENCOUNTER — Other Ambulatory Visit: Payer: Self-pay | Admitting: Oncology

## 2022-06-07 DIAGNOSIS — R922 Inconclusive mammogram: Secondary | ICD-10-CM | POA: Insufficient documentation

## 2022-06-07 DIAGNOSIS — Z86000 Personal history of in-situ neoplasm of breast: Secondary | ICD-10-CM | POA: Diagnosis not present

## 2022-06-07 DIAGNOSIS — Z9012 Acquired absence of left breast and nipple: Secondary | ICD-10-CM | POA: Diagnosis not present

## 2022-06-07 DIAGNOSIS — R928 Other abnormal and inconclusive findings on diagnostic imaging of breast: Secondary | ICD-10-CM | POA: Diagnosis not present

## 2022-06-07 DIAGNOSIS — R2231 Localized swelling, mass and lump, right upper limb: Secondary | ICD-10-CM | POA: Diagnosis not present

## 2022-06-07 DIAGNOSIS — Z853 Personal history of malignant neoplasm of breast: Secondary | ICD-10-CM | POA: Diagnosis not present

## 2022-06-07 DIAGNOSIS — N6001 Solitary cyst of right breast: Secondary | ICD-10-CM | POA: Diagnosis not present

## 2022-06-10 ENCOUNTER — Telehealth: Payer: Self-pay

## 2022-06-10 DIAGNOSIS — D0512 Intraductal carcinoma in situ of left breast: Secondary | ICD-10-CM

## 2022-06-10 NOTE — Telephone Encounter (Signed)
Pt informed and verbalized understanding.   Please schedule and inform pt:  Mammo in march  Lab/MD a few days after mammo.

## 2022-06-10 NOTE — Telephone Encounter (Signed)
-----   Message from Earlie Server, MD sent at 06/08/2022 10:57 AM EDT ----- Mammogram/US showed likely right axilla benign looking skin cyst. No intervention needed if it does not bother her.  Please arrange her to have bilateral screening mammogram in March 2024, prior to lab MD -

## 2022-06-18 DIAGNOSIS — M19041 Primary osteoarthritis, right hand: Secondary | ICD-10-CM | POA: Diagnosis not present

## 2022-06-18 DIAGNOSIS — M12841 Other specific arthropathies, not elsewhere classified, right hand: Secondary | ICD-10-CM | POA: Diagnosis not present

## 2022-06-18 DIAGNOSIS — M06842 Other specified rheumatoid arthritis, left hand: Secondary | ICD-10-CM | POA: Diagnosis not present

## 2022-06-18 DIAGNOSIS — M19042 Primary osteoarthritis, left hand: Secondary | ICD-10-CM | POA: Diagnosis not present

## 2022-06-28 DIAGNOSIS — Z9981 Dependence on supplemental oxygen: Secondary | ICD-10-CM | POA: Diagnosis not present

## 2022-06-28 DIAGNOSIS — J301 Allergic rhinitis due to pollen: Secondary | ICD-10-CM | POA: Diagnosis not present

## 2022-06-28 DIAGNOSIS — J449 Chronic obstructive pulmonary disease, unspecified: Secondary | ICD-10-CM | POA: Diagnosis not present

## 2022-06-28 DIAGNOSIS — Z23 Encounter for immunization: Secondary | ICD-10-CM | POA: Diagnosis not present

## 2022-07-05 DIAGNOSIS — J449 Chronic obstructive pulmonary disease, unspecified: Secondary | ICD-10-CM | POA: Diagnosis not present

## 2022-08-05 DIAGNOSIS — J449 Chronic obstructive pulmonary disease, unspecified: Secondary | ICD-10-CM | POA: Diagnosis not present

## 2022-09-04 ENCOUNTER — Telehealth: Payer: Self-pay | Admitting: *Deleted

## 2022-09-04 ENCOUNTER — Encounter: Payer: Self-pay | Admitting: *Deleted

## 2022-09-04 DIAGNOSIS — J449 Chronic obstructive pulmonary disease, unspecified: Secondary | ICD-10-CM | POA: Diagnosis not present

## 2022-09-04 NOTE — Patient Instructions (Signed)
Visit Information  Thank you for taking time to visit with me today. Please don't hesitate to contact me if I can be of assistance to you.  Following are the goals we discussed today:  Continue monitoring blood sugars daily.  Adhere to diabetic diet  Please call the Suicide and Crisis Lifeline: 988 call the [place] National Suicide Prevention Lifeline: 810-229-6461 or TTY: 3033903027 TTY [phone]) to talk to a trained counselor call 1-800-273-TALK (toll free, 24 hour hotline) call 911 if you are experiencing a Mental Health or [place] or need someone to talk to.  Patient verbalizes understanding of instructions and care plan provided today and agrees to view in [place]. Active MyChart status and patient understanding of how to access instructions and care plan via MyChart confirmed with patient.     The patient has been provided with contact information for the care management team and has been advised to call with any health related questions or concerns.   Valente David, RN, MSN, [place] Care Management Care Management Coordinator [phone]

## 2022-09-04 NOTE — Patient Outreach (Signed)
  Care Coordination   Initial Visit Note   09/04/2022 Name: MARIFER HURD MRN: [id number] DOB: 04/09/1941  MONIQUA ENGEBRETSEN is a 81 y.o. year old female who sees Baxter Hire, MD for primary care. I spoke with  [name] by phone today.  What matters to the patients health and wellness today?  Continue to manage DM and other chronic conditions with diet.  Does not feel follow up call is needed, will call with questions.     Goals Addressed             This Visit's Progress    Care Coordination Activities - No follow up needed       Care Coordination Interventions: Provided education to patient about basic DM disease process Reviewed medications with patient and discussed importance of medication adherence Counseled on importance of regular laboratory monitoring as prescribed Discussed plans with patient for ongoing care management follow up and provided patient with direct contact information for care management team Reviewed scheduled/upcoming provider appointments including: RA on 1/24, PCP on 1/25, and pulmonary on 3/13 Advised patient, providing education and rationale, to check cbg daily and record, calling PCP for findings outside established parameters Screening for signs and symptoms of depression related to chronic disease state  Assessed social determinant of health barriers Reviewed recent A1C and range of daily blood sugars.  State usually ran 100-110s, A1C is 6.3.  Managing well with medications and diet         SDOH assessments and interventions completed:  Yes  SDOH Interventions Today    Flowsheet Row Most Recent Value  SDOH Interventions   Food Insecurity Interventions Intervention Not Indicated  Housing Interventions Intervention Not Indicated  Transportation Interventions Intervention Not Indicated        Care Coordination Interventions:  Yes, provided   Follow up plan: No further intervention required.   Encounter Outcome:  Pt. Visit  Completed   Valente David, RN, MSN, [place] Care Management Care Management Coordinator [phone]

## 2022-09-26 DIAGNOSIS — E034 Atrophy of thyroid (acquired): Secondary | ICD-10-CM | POA: Diagnosis not present

## 2022-09-26 DIAGNOSIS — E119 Type 2 diabetes mellitus without complications: Secondary | ICD-10-CM | POA: Diagnosis not present

## 2022-10-02 DIAGNOSIS — M0579 Rheumatoid arthritis with rheumatoid factor of multiple sites without organ or systems involvement: Secondary | ICD-10-CM | POA: Diagnosis not present

## 2022-10-02 DIAGNOSIS — Z79899 Other long term (current) drug therapy: Secondary | ICD-10-CM | POA: Diagnosis not present

## 2022-10-03 DIAGNOSIS — E034 Atrophy of thyroid (acquired): Secondary | ICD-10-CM | POA: Diagnosis not present

## 2022-10-03 DIAGNOSIS — E119 Type 2 diabetes mellitus without complications: Secondary | ICD-10-CM | POA: Diagnosis not present

## 2022-10-03 DIAGNOSIS — C50512 Malignant neoplasm of lower-outer quadrant of left female breast: Secondary | ICD-10-CM | POA: Diagnosis not present

## 2022-10-03 DIAGNOSIS — M25512 Pain in left shoulder: Secondary | ICD-10-CM | POA: Diagnosis not present

## 2022-10-03 DIAGNOSIS — G2581 Restless legs syndrome: Secondary | ICD-10-CM | POA: Diagnosis not present

## 2022-10-03 DIAGNOSIS — J9611 Chronic respiratory failure with hypoxia: Secondary | ICD-10-CM | POA: Diagnosis not present

## 2022-10-03 DIAGNOSIS — E7800 Pure hypercholesterolemia, unspecified: Secondary | ICD-10-CM | POA: Diagnosis not present

## 2022-10-03 DIAGNOSIS — J449 Chronic obstructive pulmonary disease, unspecified: Secondary | ICD-10-CM | POA: Diagnosis not present

## 2022-10-03 DIAGNOSIS — Z0000 Encounter for general adult medical examination without abnormal findings: Secondary | ICD-10-CM | POA: Diagnosis not present

## 2022-10-03 DIAGNOSIS — I10 Essential (primary) hypertension: Secondary | ICD-10-CM | POA: Diagnosis not present

## 2022-10-05 DIAGNOSIS — J449 Chronic obstructive pulmonary disease, unspecified: Secondary | ICD-10-CM | POA: Diagnosis not present

## 2022-10-10 DIAGNOSIS — E11618 Type 2 diabetes mellitus with other diabetic arthropathy: Secondary | ICD-10-CM | POA: Diagnosis not present

## 2022-10-10 DIAGNOSIS — M25512 Pain in left shoulder: Secondary | ICD-10-CM | POA: Diagnosis not present

## 2022-10-10 DIAGNOSIS — M7502 Adhesive capsulitis of left shoulder: Secondary | ICD-10-CM | POA: Diagnosis not present

## 2022-10-10 DIAGNOSIS — M7552 Bursitis of left shoulder: Secondary | ICD-10-CM | POA: Diagnosis not present

## 2022-10-13 ENCOUNTER — Other Ambulatory Visit: Payer: Self-pay | Admitting: Oncology

## 2022-11-17 ENCOUNTER — Other Ambulatory Visit: Payer: Self-pay | Admitting: Oncology

## 2022-11-20 DIAGNOSIS — J449 Chronic obstructive pulmonary disease, unspecified: Secondary | ICD-10-CM | POA: Diagnosis not present

## 2022-11-20 DIAGNOSIS — J301 Allergic rhinitis due to pollen: Secondary | ICD-10-CM | POA: Diagnosis not present

## 2022-11-20 DIAGNOSIS — R0602 Shortness of breath: Secondary | ICD-10-CM | POA: Diagnosis not present

## 2022-11-22 ENCOUNTER — Ambulatory Visit
Admission: RE | Admit: 2022-11-22 | Discharge: 2022-11-22 | Disposition: A | Discharge status: Home / Self Care | Payer: PPO | Source: Ambulatory Visit | Attending: Oncology | Admitting: Oncology

## 2022-11-22 DIAGNOSIS — Z1231 Encounter for screening mammogram for malignant neoplasm of breast: Secondary | ICD-10-CM | POA: Insufficient documentation

## 2022-11-22 DIAGNOSIS — Z853 Personal history of malignant neoplasm of breast: Secondary | ICD-10-CM | POA: Insufficient documentation

## 2022-11-27 ENCOUNTER — Inpatient Hospital Stay (HOSPITAL_BASED_OUTPATIENT_CLINIC_OR_DEPARTMENT_OTHER): Payer: PPO | Admitting: Oncology

## 2022-11-27 ENCOUNTER — Inpatient Hospital Stay: Payer: PPO | Attending: Oncology

## 2022-11-27 ENCOUNTER — Encounter: Payer: Self-pay | Admitting: Oncology

## 2022-11-27 VITALS — BP 153/68 | HR 80 | Temp 97.3°F | Resp 18 | Wt 142.8 lb

## 2022-11-27 DIAGNOSIS — Z9981 Dependence on supplemental oxygen: Secondary | ICD-10-CM | POA: Insufficient documentation

## 2022-11-27 DIAGNOSIS — Z87891 Personal history of nicotine dependence: Secondary | ICD-10-CM | POA: Diagnosis not present

## 2022-11-27 DIAGNOSIS — Z853 Personal history of malignant neoplasm of breast: Secondary | ICD-10-CM | POA: Insufficient documentation

## 2022-11-27 DIAGNOSIS — M8580 Other specified disorders of bone density and structure, unspecified site: Secondary | ICD-10-CM | POA: Diagnosis not present

## 2022-11-27 DIAGNOSIS — Z79811 Long term (current) use of aromatase inhibitors: Secondary | ICD-10-CM | POA: Diagnosis not present

## 2022-11-27 DIAGNOSIS — C50411 Malignant neoplasm of upper-outer quadrant of right female breast: Secondary | ICD-10-CM | POA: Insufficient documentation

## 2022-11-27 DIAGNOSIS — Z170 Estrogen receptor positive status [ER+]: Secondary | ICD-10-CM | POA: Diagnosis not present

## 2022-11-27 DIAGNOSIS — Z808 Family history of malignant neoplasm of other organs or systems: Secondary | ICD-10-CM | POA: Insufficient documentation

## 2022-11-27 DIAGNOSIS — Z8049 Family history of malignant neoplasm of other genital organs: Secondary | ICD-10-CM | POA: Diagnosis not present

## 2022-11-27 DIAGNOSIS — Z9012 Acquired absence of left breast and nipple: Secondary | ICD-10-CM | POA: Insufficient documentation

## 2022-11-27 DIAGNOSIS — J449 Chronic obstructive pulmonary disease, unspecified: Secondary | ICD-10-CM | POA: Insufficient documentation

## 2022-11-27 DIAGNOSIS — Z86000 Personal history of in-situ neoplasm of breast: Secondary | ICD-10-CM | POA: Diagnosis not present

## 2022-11-27 DIAGNOSIS — Z800 Family history of malignant neoplasm of digestive organs: Secondary | ICD-10-CM | POA: Insufficient documentation

## 2022-11-27 DIAGNOSIS — D0510 Intraductal carcinoma in situ of unspecified breast: Secondary | ICD-10-CM | POA: Diagnosis not present

## 2022-11-27 DIAGNOSIS — L720 Epidermal cyst: Secondary | ICD-10-CM | POA: Diagnosis not present

## 2022-11-27 LAB — CBC WITH DIFFERENTIAL/PLATELET
Abs Immature Granulocytes: 0.03 10*3/uL (ref 0.00–0.07)
Basophils Absolute: 0.1 10*3/uL (ref 0.0–0.1)
Basophils Relative: 1 %
Eosinophils Absolute: 0.3 10*3/uL (ref 0.0–0.5)
Eosinophils Relative: 4 %
HCT: 42.2 % (ref 36.0–46.0)
Hemoglobin: 13.3 g/dL (ref 12.0–15.0)
Immature Granulocytes: 0 %
Lymphocytes Relative: 23 %
Lymphs Abs: 1.9 10*3/uL (ref 0.7–4.0)
MCH: 27.3 pg (ref 26.0–34.0)
MCHC: 31.5 g/dL (ref 30.0–36.0)
MCV: 86.7 fL (ref 80.0–100.0)
Monocytes Absolute: 0.6 10*3/uL (ref 0.1–1.0)
Monocytes Relative: 7 %
Neutro Abs: 5.4 10*3/uL (ref 1.7–7.7)
Neutrophils Relative %: 65 %
Platelets: 295 10*3/uL (ref 150–400)
RBC: 4.87 MIL/uL (ref 3.87–5.11)
RDW: 13.3 % (ref 11.5–15.5)
WBC: 8.2 10*3/uL (ref 4.0–10.5)
nRBC: 0 % (ref 0.0–0.2)

## 2022-11-27 LAB — COMPREHENSIVE METABOLIC PANEL
ALT: 13 U/L (ref 0–44)
AST: 19 U/L (ref 15–41)
Albumin: 4 g/dL (ref 3.5–5.0)
Alkaline Phosphatase: 57 U/L (ref 38–126)
Anion gap: 8 (ref 5–15)
BUN: 12 mg/dL (ref 8–23)
CO2: 30 mmol/L (ref 22–32)
Calcium: 9.1 mg/dL (ref 8.9–10.3)
Chloride: 101 mmol/L (ref 98–111)
Creatinine, Ser: 0.77 mg/dL (ref 0.44–1.00)
GFR, Estimated: >60 mL/min (ref >60)
Glucose, Bld: 161 mg/dL — ABNORMAL HIGH (ref 70–99)
Potassium: 3.5 mmol/L (ref 3.5–5.1)
Sodium: 139 mmol/L (ref 135–145)
Total Bilirubin: 0.5 mg/dL (ref 0.3–1.2)
Total Protein: 7.2 g/dL (ref 6.5–8.1)

## 2022-11-27 NOTE — Assessment & Plan Note (Addendum)
#  Right breast invasive carcinoma [04/2018- lumpectomy],  Continue annual right mammogram. Right axilla cystic lesion-ultrasound findings is consistent with epidermal inclusion cyst.  Patient declines dermatology evaluation.

## 2022-11-27 NOTE — Progress Notes (Signed)
Hematology/Oncology follow up  note [facility] [phone] [phone]   CHIEF COMPLAINTS/REASON FOR VISIT:  Follow up  of breast cancer  ASSESSMENT & PLAN:   Cancer Staging  Malignant neoplasm of upper-outer quadrant of right breast in female, estrogen receptor positive ([place]) Staging form: Breast, AJCC 8th Edition - Clinical: No stage assigned - Unsigned - Pathologic: Stage IA (pT1c, pN0, cM0, G3, ER+, PR+, HER2-) - Signed by Earlie Server, MD on 12/23/2018   History of invasive breast cancer #Right breast invasive carcinoma [04/2018- lumpectomy],  Continue annual right mammogram. Right axilla cystic lesion-ultrasound findings is consistent with epidermal inclusion cyst.  Patient declines dermatology evaluation.  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  Continue letrozole 2.5 mg daily.  Refills sent.   Osteopenia Recommend calcium 1200 mg daily and vitamin D supplementation. Patient has previously declined bisphosphonate.  Repeat DEXA  Orders Placed This Encounter  Procedures   DG Bone Density    Standing Status:   Future    Standing Expiration Date:   11/27/2023    Order Specific Question:   Reason for Exam (SYMPTOM  OR DIAGNOSIS REQUIRED)    Answer:   hx breast cancer, osteopenia    Order Specific Question:   Preferred imaging location?    Answer:    [facility]   CBC with Differential ([place] Only)    Standing Status:   Future    Standing Expiration Date:   11/27/2023   CMP ([place] only)    Standing Status:   Future    Standing Expiration Date:   11/27/2023   Follow-up in 6 months. All questions were answered. The patient knows to call the clinic with any problems, questions or concerns.  Earlie Server, MD, PhD [facility] Hematology Oncology 11/27/2022   HISTORY OF PRESENTING ILLNESS:  Amy Woodard is a  82 y.o.  female with PMH listed below who was referred to me for evaluation of newly diagnosed breast cancer. Patient had mammogram and ultrasound on 03/19/2018 which showed right breast 12:00 1.6 x 1.3 x 1.5 cm mass, no radiographically enlarged adenopathy.  Patient also report feeling a mass for the past couple of months. Biopsy pathology showed: Invasive mammary carcinoma, no special type, grade 3, DCIS present, lymphovascular invasion not identified, ER PR 90% positive, HER-2 negative  Nipple discharge: Denies Family history: Mother had melanoma, passed away at age of 35, sister had cervical cancer.  Sister with colon cancer, maternal grandmother sister OCP use: denies.  Estrogen and progesterone therapy: denies History of radiation to chest: denies.  Previous breast surgery: Denies  She has a complicated course of rest cancer diagnosis and surgery. # 04/16/2018 status post right breast mass lumpectomy and sentinel lymph node biopsy.  Pathology showed invasive mammary carcinoma 1.7 cm, DCIS, high-grade with extensive involvement of lobules.  There is focal deep margin involvement by a different lobular type carcinoma, 19mm.  Sentinel lymph node biopsy negative. pT1c pN0. ER >90%, PR >90%, HER 2 negative.   05/14/2018 patient underwent MRI breast bilaterally to determined extent of positive deep margin/lobular type breast cancer. MRI breast showed an irregular 1.4 cm mass in the lower inner quadrant of the left breast. Abnormal non-mass enhancement in the upper outer right breast, axillary tail region could be secondary to postsurgical changes, malignancy cannot be excluded. 06/05/2018 left breast mass  biopsy showed invasive ductal carcinoma, grade 3, this was signed out by pathology group in [facility] Dr. Jaquita Folds. ER 100%, PR 50%, not enough tissue for HER 2 analysis.  07/03/2018 patient  underwent right breast deep margin lumpectomy/reexcision: Pathology showed negative for residual malignancy. Left breast needle localized lumpectomy showed negative for residual malignancy.  Sentinel lymph node negative In addition the outer slides on this patient's left breast biopsy were requested and reviewed by Dr. Reuel Derby.  Dr. Reuel Derby feels the patient's biopsy showed high-grade DCIS with focal areas suspicious for invasion.  Definitive invasive carcinoma is not identified.  DCIS is present in both outside blocks and slides from biopsy and measures 7 mm and 6 mm respectively.  pTis pN0 Patient case have been discussed on breast tumor board multiple times with reviewing of images and pathology findings. Left breast MRI did show a 1.4 cm irregularity, and pathology only 7 mm and a 6 mm DCIS were found at the initial left breast biopsy.  Final left breast lumpectomy did not contain clips which was placed with MRI guidance at biopsy.. Per my discussion with surgeon Dr.Sakai, even clips are found on repeat image, re-excision is not feasible and the next step will be mastectomy.   # 07/03/2018-07/06/2018 admitted for pain control, sentinel lymph node biopsy site hematoma,  #07/10/2018 - 07/14/2018 Patient admitted due to acute on chronic anemia due to hematoma.  She was admitted for evacuation of postoperative left axillary sentinel lymph node biopsy site hematoma.  Status post PRBC transfusion during her admission. # 08/14/2018-08/17/2018 readmitted due to breast cellulitis despite outpatient drainage and oral antibiotics.  # 08/24/2018- 08/28/2018 readmitted due to recurrent right breast abscess. She was placed on IV antibiotics with Ceftriaxone via picc line.  #A repeat US has been done on 09/07/2018 and it shows near resolution of the fluid. # #Finished IV Ceftriaxone on 09/11/2018.   Patient has a history of chronic respiratory failure/ COPD, on home oxygen.  # Right breast pT1c pN0, reexcision did  not reveal any residual disease.. Oncotype DX showed recurrence rate of 31, patient has 15% benefit from adjuvant chemotherapy.  Decision was made not to proceed with adjuvant chemotherapy due to significant delays secondary to surgical complication, slow recovery from surgery, borderline performance status and comorbidities.  Patient declined radiation due to severe lung condition and is afraid of lung toxicities.[name]  #Left breast high-grade DCIS She has been tolerating antiestrogen treatment with Arimidex very well.  Manageable side effects. left breast biopsy clip was not found in the specimen. Continue Arimidex, tolerates well.   10/27/2018 left unilateral diagnostic mammogram showed left breast lower inner quadrant clip lstill presents results was discussed with patient and patient's surgeon Dr. Lysle Pearl.  Dr. Lysle Pearl feels the only option will be mastectomy. Discussed with patient that there is chance of remaining cancer in her left breast in the area where the clip was placed. Patient is not interested in additional breast surgeries.  Continue antiestrogen treatments and surveillance mammogram.  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 Recommend patient to follow-up with primary care provider.  If it continues to bother her she needs to see a surgery for evaluation and resection.  #09/17/2018 Started on ArimideX # 10/23/2020, MRI breast bilaterally showed indeterminate stippled linear non-mass enhancement involving the lower inner quadrant of the left breast extending from the prior lumpectomy site anteriorly, spanning 2.5 cm.  Indeterminate 0.7 cm mass involving the lower outer quadrant of the left  breast at a posterior depth.  Indeterminate 0.9 cm masslike enhancement involving the right middle lobe laterally.  No pathologic lymphadenopathy.  No MRI evidence of malignancy involving the right breast.  # 10/30/2020  biopsy of the linear none mass enhancement in the  lower inner posterior left breast.  The MRI biopsy of the second edition was unable to be performed Pathology showed high-grade DCIS of the left breast, the other smaller mass left outer quadrant not able to biopsied. Addendum, Hormone receptor status was added to her 10/30/2020 , results showed ER 95%, PR 0% She is currently on Arimidex and I recommend patient to switch to other AI.   12/01/2020, left mastectomy with sentinel lymph node biopsy. Focal atypical ductal hyperplasia, background benign mammary parenchyma with fat necrosis.  Scar, and reactive keratinizing squamous metaplasia compatible with prior procedure site.  Clips x2 and biopsy sites identified.  Benign nipple/areolar complex.  Incidental seborrheic keratosis negative for definite DCIS and malignancy. 1 sentinel lymph nodes were negative   11/07/2020 CT chest wo contrast showed no evidence of metastatic disease within the thorax.  No masses or pathologically enlarged lymph nodes identified on this unenhanced exam.  Patient has multiple tiny calcified granulomas noted in the right upper lobe.  Mild scarring is seen in the right upper and middle lobes and inferior lingula.  No suspicious nodules or masses identified.  04/20/2021, given that patient developed recurrence while on Arimidex.  AI was switched to letrozole.   INTERVAL HISTORY KRUPA DIANTONIO is a 82 y.o. female who has above history reviewed by me today presents for follow up visit for management of breast cancer. Patient tolerates letrozole well.  Side effects are manageable. Chronic shortness of breath, unchanged.  Patient has no new breast concerns.  Review of Systems  Constitutional:  Negative for chills, fever, malaise/fatigue and weight loss.  HENT:  Negative for nosebleeds and sore throat.   Eyes:  Negative for double vision, photophobia and redness.  Respiratory:  Positive for shortness of breath. Negative for cough and wheezing.   Cardiovascular:  Negative for  chest pain, palpitations, orthopnea and leg swelling.  Gastrointestinal:  Negative for abdominal pain, blood in stool, nausea and vomiting.  Genitourinary:  Negative for dysuria.  Musculoskeletal:  Negative for myalgias.  Skin:  Negative for itching and rash.  Neurological:  Negative for dizziness, tingling and tremors.  Endo/Heme/Allergies:  Negative for environmental allergies. Does not bruise/bleed easily.  Psychiatric/Behavioral:  Negative for depression and hallucinations.     MEDICAL HISTORY:  Past Medical History:  Diagnosis Date   Allergy    Aortic atherosclerosis ([place])    Breast cancer ([place]) 05/2018   Right breast found 1st, then left breast with ductal ca   Cellulitis of breast 05/2018   right breast cellulitis after first biopsy,  antibiotics prescribed   COPD (chronic obstructive pulmonary disease) (HCC)    Coronary atherosclerosis    Diabetes mellitus without complication (HCC)    Dyspnea    GERD (gastroesophageal reflux disease)    occasionally   History of kidney stones    HLD (hyperlipidemia)    Hypertension    Hypothyroidism    Oxygen dependent    2liter nasal prong continuously   Pneumonia    Rheumatoid arthritis (HCC)    RLS (restless legs syndrome)     SURGICAL HISTORY: Past Surgical History:  Procedure Laterality Date   APPENDECTOMY     BACK SURGERY     lumbar. herniated disc . no metal  BREAST BIOPSY Right 03/19/2018   INVASIVE MAMMARY CARCINOMA, NO SPECIAL TYPE   BREAST BIOPSY Left 06/01/2018   MRI bx, grade III invasive ductal carcinoma   BREAST BIOPSY Right 06/01/2018   MRI bx of enhancing mass, path showed FOREIGN BODY GRANULOMATOUS RESPONSE    BREAST LUMPECTOMY Right 04/16/2018   [facility], DCIS, LN negative   BREAST LUMPECTOMY Right 07/03/2018   Procedure: RE-EXCISION OF RIGHT BREAST TISSUE MASS;  Surgeon: Benjamine Sprague, DO;  Location: [facility] ORS;  Service: General;  Laterality: Right;   BREAST LUMPECTOMY Left 06/2018   invasive ductal    IRRIGATION AND DEBRIDEMENT HEMATOMA Left 07/10/2018   Procedure: IRRIGATION AND DEBRIDEMENT HEMATOMA LEFT AXILLARY;  Surgeon: Benjamine Sprague, DO;  Location: [facility] ORS;  Service: General;  Laterality: Left;   PARTIAL MASTECTOMY WITH NEEDLE LOCALIZATION AND AXILLARY SENTINEL LYMPH NODE BX Right 04/16/2018   Procedure: PARTIAL MASTECTOMY WITH NEEDLE LOCALIZATION AND AXILLARY SENTINEL LYMPH NODE BX;  Surgeon: Benjamine Sprague, DO;  Location: [facility] OR[facility];  Service: General;  Laterality: Right;   PARTIAL MASTECTOMY WITH NEEDLE LOCALIZATION AND AXILLARY SENTINEL LYMPH NODE BX Left 07/03/2018   Procedure: PARTIAL MASTECTOMY WITH NEEDLE LOCALIZATION AND SENTINEL LYMPH NODE BX;  Surgeon: Benjamine Sprague, DO;  Location: [facility] OR[facility];  Service: General;  Laterality: Left;   TOTAL MASTECTOMY Left 12/01/2020   Procedure: TOTAL MASTECTOMY;  Surgeon: Benjamine Sprague, DO;  Location: [facility] OR[facility];  Service: General;  Laterality: Left;    SOCIAL HISTORY: Social History   Socioeconomic History   Marital status: Married    Spouse name: richard..disabled   Number of children: Not on file   Years of education: Not on file   Highest education level: Not on file  Occupational History   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  Tobacco Use   Smoking status: Former    Types: Cigarettes    Quit date: 03/30/1993    Years since quitting: 29.6   Smokeless tobacco: Never  Vaping Use   Vaping Use: Never used  Substance and Sexual Activity   Alcohol use: No   Drug use: Never   Sexual activity: Not Currently  Other Topics Concern   Not on file  Social History Narrative   Not on file   Social Determinants of Health   Financial Resource Strain: [place]  (07/03/2018)   Overall Financial Resource Strain (CARDIA)    Difficulty of Paying Living Expenses: Not hard at all  Food Insecurity: No Food Insecurity (09/04/2022)   Hunger Vital Sign    Worried About Running Out of Food in the Last Year: Never true    [place]  in the Last Year: Never true  Transportation Needs: No Transportation Needs (09/04/2022)   PRAPARE - Hydrologist (Medical): No    Lack of Transportation (Non-Medical): No  Physical Activity: Sufficiently Active (07/03/2018)   Exercise Vital Sign    Days of Exercise per Week: 7 days    Minutes of Exercise per Session: 30 min  Stress: No Stress Concern Present (07/03/2018)   [place]    Feeling of Stress : Not at all  Social Connections: Moderately Integrated (07/03/2018)   Social Connection and Isolation Panel [NHANES]    Frequency of Communication with Friends and Family: More than three times a week    Frequency of Social Gatherings with Friends and Family: More than three times a week    Attends Religious  Services: 1 to 4 times per year    Active Member of Clubs or Organizations: No    Attends Archivist Meetings: Never    Marital Status: Married  Human resources officer Violence: Not At Risk (07/03/2018)   Humiliation, Afraid, Rape, and Kick questionnaire    Fear of Current or Ex-Partner: No    Emotionally Abused: No    Physically Abused: No    Sexually Abused: No    FAMILY HISTORY: Family History  Problem Relation Age of Onset   Clotting disorder Mother    Melanoma Mother        deceased 72   Lung cancer Sister        deceased 23s; smoker   Alcohol abuse Father        deceased 40   Cervical cancer Other        neice   Breast cancer Neg Hx     ALLERGIES:  is allergic to leflunomide and sulfa antibiotics.  MEDICATIONS:  Current Outpatient Medications  Medication Sig Dispense Refill   azelastine (ASTELIN) 0.1 % nasal spray Place 1 spray into both nostrils 2 (two) times daily.     fluticasone (FLONASE) 50 MCG/ACT nasal spray Place 2 sprays into both nostrils daily as needed for allergies.     Fluticasone-Umeclidin-Vilant (TRELEGY ELLIPTA) 100-62.5-25 MCG/ACT AEPB  Inhale into the lungs.     folic acid (FOLVITE) 1 MG tablet Take 1 mg by mouth daily.     glipiZIDE (GLUCOTROL XL) 5 MG 24 hr tablet Take 5 mg by mouth daily.     hydroxychloroquine (PLAQUENIL) 200 MG tablet Take 400 mg by mouth daily.      Ipratropium-Albuterol (COMBIVENT) 20-100 MCG/ACT AERS respimat Inhale 1 puff into the lungs every 6 (six) hours as needed for wheezing or shortness of breath.     irbesartan (AVAPRO) 75 MG tablet Take 75 mg by mouth daily.     letrozole (FEMARA) 2.5 MG tablet TAKE 1 TABLET BY MOUTH EVERY DAY 90 tablet 2   levothyroxine (SYNTHROID, LEVOTHROID) 50 MCG tablet Take 50 mcg by mouth daily before breakfast.     methotrexate (RHEUMATREX) 2.5 MG tablet Take by mouth. Take 4 tablets (10 mg total) by mouth every 7 (seven) days     omeprazole (PRILOSEC) 20 MG capsule Take 20 mg by mouth daily as needed.     VITAMIN D3 50 MCG (2000 UT) capsule TAKE 1 CAPSULE BY MOUTH EVERY DAY 100 capsule 1   HYDROcodone-acetaminophen (NORCO/VICODIN) 5-325 MG tablet Take 1-2 tablets by mouth every 4 (four) hours as needed for moderate pain. (Patient not taking: Reported on 11/27/2022) 20 tablet 0   No current facility-administered medications for this visit.     PHYSICAL EXAMINATION: ECOG PERFORMANCE STATUS: 1 - Symptomatic but completely ambulatory Vitals:   11/27/22 0959  BP: (!) 153/68  Pulse: 80  Resp: 18  Temp: (!) 97.3 F (36.3 C)  SpO2: 94%   Filed Weights   11/27/22 0959  Weight: 142 lb 12.8 oz (64.8 kg)    Physical Exam Constitutional:      General: She is not in acute distress.    Comments: On home oxygen  HENT:     Head: Normocephalic and atraumatic.  Eyes:     General: No scleral icterus.    Conjunctiva/sclera: Conjunctivae normal.     Pupils: Pupils are equal, round, and reactive to light.  Cardiovascular:     Rate and Rhythm: Normal rate and regular rhythm.  Heart sounds: Normal heart sounds.  Pulmonary:     Effort: Pulmonary effort is normal. No  respiratory distress.     Comments: Decreased breath sound bilaterally Abdominal:     General: Bowel sounds are normal. There is no distension.     Palpations: Abdomen is soft. There is no mass.     Tenderness: There is no abdominal tenderness.  Musculoskeletal:        General: No deformity. Normal range of motion.     Cervical back: Normal range of motion and neck supple.  Lymphadenopathy:     Cervical: No cervical adenopathy.  Skin:    General: Skin is warm and dry.     Findings: No erythema or rash.     Comments: Left side posterior upper chest wall soft tissue nodule previously worked up which is likely a lipoma  Neurological:     General: No focal deficit present.     Mental Status: She is alert and oriented to person, place, and time.     Cranial Nerves: No cranial nerve deficit.     Coordination: Coordination normal.  Psychiatric:        Mood and Affect: Mood normal.        LABORATORY DATA:  I have reviewed the data as listed    Latest Ref Rng & Units 11/27/2022    9:40 AM 11/15/2021    2:11 PM 04/11/2021    2:06 PM  CBC  WBC 4.0 - 10.5 K/uL 8.2  8.1  7.9   Hemoglobin 12.0 - 15.0 g/dL 13.3  13.5  13.3   Hematocrit 36.0 - 46.0 % 42.2  43.5  42.9   Platelets 150 - 400 K/uL 295  282  288       Latest Ref Rng & Units 11/27/2022    9:40 AM 11/15/2021    2:11 PM 04/11/2021    2:06 PM  CMP  Glucose 70 - 99 mg/dL 161  150  163   BUN 8 - 23 mg/dL 12  14  19    Creatinine 0.44 - 1.00 mg/dL 0.77  0.97  0.96   Sodium 135 - 145 mmol/L 139  136  137   Potassium 3.5 - 5.1 mmol/L 3.5  4.4  4.0   Chloride 98 - 111 mmol/L 101  95  98   CO2 22 - 32 mmol/L 30  33  32   Calcium 8.9 - 10.3 mg/dL 9.1  9.4  9.1   Total Protein 6.5 - 8.1 g/dL 7.2  7.3  7.4   Total Bilirubin 0.3 - 1.2 mg/dL 0.5  0.5  0.4   Alkaline Phos 38 - 126 U/L 57  55  55   AST 15 - 41 U/L 19  15  17    ALT 0 - 44 U/L 13  12  12

## 2022-11-27 NOTE — Assessment & Plan Note (Addendum)
Recommend calcium 1200 mg daily and vitamin D supplementation. Patient has previously declined bisphosphonate.  Repeat DEXA

## 2022-11-27 NOTE — Assessment & Plan Note (Addendum)
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  Continue letrozole 2.5 mg daily.  Refills sent.

## 2023-02-05 DIAGNOSIS — M0630 Rheumatoid nodule, unspecified site: Secondary | ICD-10-CM | POA: Diagnosis not present

## 2023-02-05 DIAGNOSIS — Z79899 Other long term (current) drug therapy: Secondary | ICD-10-CM | POA: Diagnosis not present

## 2023-02-05 DIAGNOSIS — L720 Epidermal cyst: Secondary | ICD-10-CM | POA: Diagnosis not present

## 2023-02-05 DIAGNOSIS — M0579 Rheumatoid arthritis with rheumatoid factor of multiple sites without organ or systems involvement: Secondary | ICD-10-CM | POA: Diagnosis not present

## 2023-02-10 DIAGNOSIS — R399 Unspecified symptoms and signs involving the genitourinary system: Secondary | ICD-10-CM | POA: Diagnosis not present

## 2023-02-28 ENCOUNTER — Ambulatory Visit
Admission: RE | Admit: 2023-02-28 | Discharge: 2023-02-28 | Disposition: A | Discharge status: Home / Self Care | Payer: PPO | Source: Ambulatory Visit | Attending: Oncology | Admitting: Oncology

## 2023-02-28 DIAGNOSIS — E119 Type 2 diabetes mellitus without complications: Secondary | ICD-10-CM | POA: Insufficient documentation

## 2023-02-28 DIAGNOSIS — E039 Hypothyroidism, unspecified: Secondary | ICD-10-CM | POA: Insufficient documentation

## 2023-02-28 DIAGNOSIS — Z13820 Encounter for screening for osteoporosis: Secondary | ICD-10-CM | POA: Insufficient documentation

## 2023-02-28 DIAGNOSIS — J449 Chronic obstructive pulmonary disease, unspecified: Secondary | ICD-10-CM | POA: Diagnosis not present

## 2023-02-28 DIAGNOSIS — M8589 Other specified disorders of bone density and structure, multiple sites: Secondary | ICD-10-CM | POA: Diagnosis not present

## 2023-02-28 DIAGNOSIS — Z853 Personal history of malignant neoplasm of breast: Secondary | ICD-10-CM | POA: Diagnosis not present

## 2023-02-28 DIAGNOSIS — Z780 Asymptomatic menopausal state: Secondary | ICD-10-CM | POA: Diagnosis not present

## 2023-02-28 DIAGNOSIS — M8580 Other specified disorders of bone density and structure, unspecified site: Secondary | ICD-10-CM

## 2023-03-02 DIAGNOSIS — E785 Hyperlipidemia, unspecified: Secondary | ICD-10-CM | POA: Diagnosis not present

## 2023-03-02 DIAGNOSIS — Z8249 Family history of ischemic heart disease and other diseases of the circulatory system: Secondary | ICD-10-CM | POA: Diagnosis not present

## 2023-03-02 DIAGNOSIS — Z9841 Cataract extraction status, right eye: Secondary | ICD-10-CM | POA: Diagnosis not present

## 2023-03-02 DIAGNOSIS — Z794 Long term (current) use of insulin: Secondary | ICD-10-CM | POA: Diagnosis not present

## 2023-03-02 DIAGNOSIS — D509 Iron deficiency anemia, unspecified: Secondary | ICD-10-CM | POA: Diagnosis not present

## 2023-03-02 DIAGNOSIS — N1832 Chronic kidney disease, stage 3b: Secondary | ICD-10-CM | POA: Diagnosis not present

## 2023-03-02 DIAGNOSIS — E1122 Type 2 diabetes mellitus with diabetic chronic kidney disease: Secondary | ICD-10-CM | POA: Diagnosis not present

## 2023-03-02 DIAGNOSIS — R5381 Other malaise: Secondary | ICD-10-CM | POA: Diagnosis not present

## 2023-03-02 DIAGNOSIS — Z9842 Cataract extraction status, left eye: Secondary | ICD-10-CM | POA: Diagnosis not present

## 2023-03-02 DIAGNOSIS — Z9049 Acquired absence of other specified parts of digestive tract: Secondary | ICD-10-CM | POA: Diagnosis not present

## 2023-03-02 DIAGNOSIS — D631 Anemia in chronic kidney disease: Secondary | ICD-10-CM | POA: Diagnosis not present

## 2023-03-02 DIAGNOSIS — N390 Urinary tract infection, site not specified: Secondary | ICD-10-CM | POA: Diagnosis not present

## 2023-03-02 DIAGNOSIS — Z951 Presence of aortocoronary bypass graft: Secondary | ICD-10-CM | POA: Diagnosis not present

## 2023-03-02 DIAGNOSIS — D6832 Hemorrhagic disorder due to extrinsic circulating anticoagulants: Secondary | ICD-10-CM | POA: Diagnosis not present

## 2023-03-02 DIAGNOSIS — N179 Acute kidney failure, unspecified: Secondary | ICD-10-CM | POA: Diagnosis not present

## 2023-03-02 DIAGNOSIS — Z961 Presence of intraocular lens: Secondary | ICD-10-CM | POA: Diagnosis not present

## 2023-03-02 DIAGNOSIS — M109 Gout, unspecified: Secondary | ICD-10-CM | POA: Diagnosis not present

## 2023-03-02 DIAGNOSIS — E890 Postprocedural hypothyroidism: Secondary | ICD-10-CM | POA: Diagnosis not present

## 2023-03-02 DIAGNOSIS — A419 Sepsis, unspecified organism: Secondary | ICD-10-CM | POA: Diagnosis not present

## 2023-03-02 DIAGNOSIS — Z79890 Hormone replacement therapy (postmenopausal): Secondary | ICD-10-CM | POA: Diagnosis not present

## 2023-03-02 DIAGNOSIS — I2489 Other forms of acute ischemic heart disease: Secondary | ICD-10-CM | POA: Diagnosis not present

## 2023-03-02 DIAGNOSIS — D649 Anemia, unspecified: Secondary | ICD-10-CM | POA: Diagnosis not present

## 2023-03-02 DIAGNOSIS — I130 Hypertensive heart and chronic kidney disease with heart failure and stage 1 through stage 4 chronic kidney disease, or unspecified chronic kidney disease: Secondary | ICD-10-CM | POA: Diagnosis not present

## 2023-03-02 DIAGNOSIS — K921 Melena: Secondary | ICD-10-CM | POA: Diagnosis not present

## 2023-03-02 DIAGNOSIS — I5032 Chronic diastolic (congestive) heart failure: Secondary | ICD-10-CM | POA: Diagnosis not present

## 2023-03-02 DIAGNOSIS — I2510 Atherosclerotic heart disease of native coronary artery without angina pectoris: Secondary | ICD-10-CM | POA: Diagnosis not present

## 2023-03-02 DIAGNOSIS — N400 Enlarged prostate without lower urinary tract symptoms: Secondary | ICD-10-CM | POA: Diagnosis not present

## 2023-03-03 DIAGNOSIS — A419 Sepsis, unspecified organism: Secondary | ICD-10-CM | POA: Diagnosis not present

## 2023-03-03 DIAGNOSIS — N390 Urinary tract infection, site not specified: Secondary | ICD-10-CM | POA: Diagnosis not present

## 2023-03-04 DIAGNOSIS — A419 Sepsis, unspecified organism: Secondary | ICD-10-CM | POA: Diagnosis not present

## 2023-03-04 DIAGNOSIS — N390 Urinary tract infection, site not specified: Secondary | ICD-10-CM | POA: Diagnosis not present

## 2023-03-05 DIAGNOSIS — A419 Sepsis, unspecified organism: Secondary | ICD-10-CM | POA: Diagnosis not present

## 2023-03-05 DIAGNOSIS — N390 Urinary tract infection, site not specified: Secondary | ICD-10-CM | POA: Diagnosis not present

## 2023-03-06 DIAGNOSIS — D649 Anemia, unspecified: Secondary | ICD-10-CM | POA: Diagnosis not present

## 2023-03-09 ENCOUNTER — Encounter: Payer: Self-pay | Admitting: Oncology

## 2023-03-27 DIAGNOSIS — J449 Chronic obstructive pulmonary disease, unspecified: Secondary | ICD-10-CM | POA: Diagnosis not present

## 2023-04-02 DIAGNOSIS — E034 Atrophy of thyroid (acquired): Secondary | ICD-10-CM | POA: Diagnosis not present

## 2023-04-02 DIAGNOSIS — E119 Type 2 diabetes mellitus without complications: Secondary | ICD-10-CM | POA: Diagnosis not present

## 2023-04-09 DIAGNOSIS — Z0001 Encounter for general adult medical examination with abnormal findings: Secondary | ICD-10-CM | POA: Diagnosis not present

## 2023-04-09 DIAGNOSIS — M0579 Rheumatoid arthritis with rheumatoid factor of multiple sites without organ or systems involvement: Secondary | ICD-10-CM | POA: Diagnosis not present

## 2023-04-09 DIAGNOSIS — E119 Type 2 diabetes mellitus without complications: Secondary | ICD-10-CM | POA: Diagnosis not present

## 2023-04-09 DIAGNOSIS — J449 Chronic obstructive pulmonary disease, unspecified: Secondary | ICD-10-CM | POA: Diagnosis not present

## 2023-04-09 DIAGNOSIS — E7800 Pure hypercholesterolemia, unspecified: Secondary | ICD-10-CM | POA: Diagnosis not present

## 2023-04-09 DIAGNOSIS — I10 Essential (primary) hypertension: Secondary | ICD-10-CM | POA: Diagnosis not present

## 2023-04-09 DIAGNOSIS — E034 Atrophy of thyroid (acquired): Secondary | ICD-10-CM | POA: Diagnosis not present

## 2023-04-09 DIAGNOSIS — J9611 Chronic respiratory failure with hypoxia: Secondary | ICD-10-CM | POA: Diagnosis not present

## 2023-04-22 DIAGNOSIS — Z9012 Acquired absence of left breast and nipple: Secondary | ICD-10-CM | POA: Diagnosis not present

## 2023-04-22 DIAGNOSIS — J449 Chronic obstructive pulmonary disease, unspecified: Secondary | ICD-10-CM | POA: Diagnosis not present

## 2023-04-22 DIAGNOSIS — J301 Allergic rhinitis due to pollen: Secondary | ICD-10-CM | POA: Diagnosis not present

## 2023-04-22 DIAGNOSIS — R0609 Other forms of dyspnea: Secondary | ICD-10-CM | POA: Diagnosis not present

## 2023-04-27 DIAGNOSIS — J449 Chronic obstructive pulmonary disease, unspecified: Secondary | ICD-10-CM | POA: Diagnosis not present

## 2023-05-28 DIAGNOSIS — J449 Chronic obstructive pulmonary disease, unspecified: Secondary | ICD-10-CM | POA: Diagnosis not present

## 2023-05-30 ENCOUNTER — Inpatient Hospital Stay: Payer: PPO | Attending: Oncology

## 2023-05-30 ENCOUNTER — Encounter: Payer: Self-pay | Admitting: Oncology

## 2023-05-30 ENCOUNTER — Inpatient Hospital Stay: Payer: PPO | Admitting: Oncology

## 2023-05-30 VITALS — BP 118/61 | HR 79 | Temp 98.0°F | Resp 18 | Wt 148.3 lb

## 2023-05-30 DIAGNOSIS — L989 Disorder of the skin and subcutaneous tissue, unspecified: Secondary | ICD-10-CM | POA: Insufficient documentation

## 2023-05-30 DIAGNOSIS — Z79811 Long term (current) use of aromatase inhibitors: Secondary | ICD-10-CM | POA: Insufficient documentation

## 2023-05-30 DIAGNOSIS — D0510 Intraductal carcinoma in situ of unspecified breast: Secondary | ICD-10-CM | POA: Diagnosis not present

## 2023-05-30 DIAGNOSIS — Z86000 Personal history of in-situ neoplasm of breast: Secondary | ICD-10-CM | POA: Insufficient documentation

## 2023-05-30 DIAGNOSIS — M8580 Other specified disorders of bone density and structure, unspecified site: Secondary | ICD-10-CM

## 2023-05-30 DIAGNOSIS — C50911 Malignant neoplasm of unspecified site of right female breast: Secondary | ICD-10-CM | POA: Insufficient documentation

## 2023-05-30 DIAGNOSIS — Z853 Personal history of malignant neoplasm of breast: Secondary | ICD-10-CM

## 2023-05-30 DIAGNOSIS — M7989 Other specified soft tissue disorders: Secondary | ICD-10-CM | POA: Insufficient documentation

## 2023-05-30 DIAGNOSIS — Z170 Estrogen receptor positive status [ER+]: Secondary | ICD-10-CM | POA: Diagnosis not present

## 2023-05-30 LAB — CMP (CANCER CENTER ONLY)
ALT: 12 U/L (ref 0–44)
AST: 17 U/L (ref 15–41)
Albumin: 4.2 g/dL (ref 3.5–5.0)
Alkaline Phosphatase: 57 U/L (ref 38–126)
Anion gap: 9 (ref 5–15)
BUN: 14 mg/dL (ref 8–23)
CO2: 30 mmol/L (ref 22–32)
Calcium: 9.3 mg/dL (ref 8.9–10.3)
Chloride: 101 mmol/L (ref 98–111)
Creatinine: 1.01 mg/dL — ABNORMAL HIGH (ref 0.44–1.00)
GFR, Estimated: 56 mL/min — ABNORMAL LOW (ref >60)
Glucose, Bld: 103 mg/dL — ABNORMAL HIGH (ref 70–99)
Potassium: 3.9 mmol/L (ref 3.5–5.1)
Sodium: 140 mmol/L (ref 135–145)
Total Bilirubin: 0.5 mg/dL (ref 0.3–1.2)
Total Protein: 7.5 g/dL (ref 6.5–8.1)

## 2023-05-30 LAB — CBC WITH DIFFERENTIAL (CANCER CENTER ONLY)
Abs Immature Granulocytes: 0.04 10*3/uL (ref 0.00–0.07)
Basophils Absolute: 0.1 10*3/uL (ref 0.0–0.1)
Basophils Relative: 1 %
Eosinophils Absolute: 0.3 10*3/uL (ref 0.0–0.5)
Eosinophils Relative: 4 %
HCT: 43.4 % (ref 36.0–46.0)
Hemoglobin: 13.5 g/dL (ref 12.0–15.0)
Immature Granulocytes: 0 %
Lymphocytes Relative: 20 %
Lymphs Abs: 1.8 10*3/uL (ref 0.7–4.0)
MCH: 27.1 pg (ref 26.0–34.0)
MCHC: 31.1 g/dL (ref 30.0–36.0)
MCV: 87 fL (ref 80.0–100.0)
Monocytes Absolute: 0.7 10*3/uL (ref 0.1–1.0)
Monocytes Relative: 8 %
Neutro Abs: 6.1 10*3/uL (ref 1.7–7.7)
Neutrophils Relative %: 67 %
Platelet Count: 332 10*3/uL (ref 150–400)
RBC: 4.99 MIL/uL (ref 3.87–5.11)
RDW: 13.6 % (ref 11.5–15.5)
WBC Count: 9.1 10*3/uL (ref 4.0–10.5)
nRBC: 0 % (ref 0.0–0.2)

## 2023-05-30 MED ORDER — LETROZOLE 2.5 MG PO TABS: 2.5000 mg | ORAL_TABLET | Freq: Every day | ORAL | 1 refills | Status: DC

## 2023-05-30 NOTE — Progress Notes (Signed)
Pt here for follow up. No new issue, no new breast problems.

## 2023-05-30 NOTE — Assessment & Plan Note (Signed)
Refer to dermatology

## 2023-05-30 NOTE — Progress Notes (Signed)
Hematology/Oncology follow up  note [facility] [phone] [phone]   CHIEF COMPLAINTS/REASON FOR VISIT:  Follow up  of breast cancer  ASSESSMENT & PLAN:   Cancer Staging  Malignant neoplasm of upper-outer quadrant of right breast in female, estrogen receptor positive (HCC) Staging form: Breast, AJCC 8th Edition - Clinical: No stage assigned - Unsigned - Pathologic: Stage IA (pT1c, pN0, cM0, G3, ER+, PR+, HER2-) - Signed by Rickard Patience, MD on 12/23/2018   History of invasive breast cancer #Right breast invasive carcinoma [04/2018- lumpectomy], pT1c pN0. Oncotype DX showed recurrence rate of 31 Shared decision was made not to proceed with adjuvant chemotherapy due to significant delays secondary to surgical complication, slow recovery from surgery, borderline performance status and comorbidities Continue annual right breast mammogram for surveillance.  Discussed with patient regarding extending endocrine therapy beyond 5 years given her high Oncotype Dx score. She agrees. Continue Letrozole 2.5mg  daily   -Right axilla cystic lesion-ultrasound findings is consistent with epidermal inclusion cyst.  Patient previously declineds dermatology evaluation. 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 Continue letrozole 2.5 mg daily.  Refills sent.   Osteopenia Patient has previously declined bisphosphonate.  02/28/2023  DEXA showed osteopenia, probability of a major osteoporotic fracture is 16.0% within the next ten years. I discussed with patient with bisphosphonate, she adamantly declined.  Recommend calcium 1200 mg daily and vitamin D supplementation.  Skin lesion of right arm Refer to dermatology  Orders  Placed This Encounter  Procedures   MM 3D SCREENING MAMMOGRAM UNILATERAL RIGHT BREAST    Standing Status:   Future    Standing Expiration Date:   05/29/2024    Order Specific Question:   Reason for Exam (SYMPTOM  OR DIAGNOSIS REQUIRED)    Answer:   Breast cancer    Order Specific Question:   Preferred imaging location?    Answer:   [facility]   CBC with Differential (Cancer Center Only)    Standing Status:   Future    Standing Expiration Date:   05/29/2024   CMP (Cancer Center only)    Standing Status:   Future    Standing Expiration Date:   05/29/2024   Follow-up in 6 months. All questions were answered. The patient knows to call the clinic with any problems, questions or concerns.  Rickard Patience, MD, PhD [facility] Hematology Oncology 05/30/2023   HISTORY OF PRESENTING ILLNESS:  Amy Woodard is a  82 y.o.  female with PMH listed below who was referred to me for evaluation of newly diagnosed breast cancer. Patient had mammogram and ultrasound on 03/19/2018 which showed right breast 12:00 1.6 x 1.3 x 1.5 cm mass, no radiographically enlarged adenopathy.  Patient also report feeling a mass for the past couple of months. Biopsy pathology showed: Invasive mammary carcinoma, no special type, grade 3, DCIS present, lymphovascular invasion not identified, ER PR 90% positive, HER-2 negative  Nipple discharge: Denies Family history: Mother had melanoma, passed away at age of 40, sister had cervical cancer.  Sister with colon cancer, maternal grandmother sister OCP use: denies.  Estrogen and progesterone therapy: denies History of radiation to chest: denies.  Previous breast surgery: Denies  She has a complicated course of rest cancer diagnosis and surgery. # 04/16/2018 status post right breast mass lumpectomy and sentinel lymph node  biopsy.  Pathology showed invasive mammary carcinoma 1.7 cm, DCIS, high-grade with extensive involvement of lobules.  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  Sentinel lymph node biopsy negative. pT1c pN0. ER >90%, PR >90%, HER 2 negative.   05/14/2018 patient underwent MRI breast bilaterally to determined extent of positive deep margin/lobular type breast cancer. MRI breast showed an irregular 1.4 cm mass in the lower inner quadrant of the left breast. Abnormal non-mass enhancement in the upper outer right breast, axillary tail region could be secondary to postsurgical changes, malignancy cannot be excluded. 06/05/2018 left breast mass biopsy showed invasive ductal carcinoma, grade 3, this was signed out by pathology group in [facility] Dr. Holley Bouche. ER 100%, PR 50%, not enough tissue for HER 2 analysis.  07/03/2018 patient underwent right breast deep margin lumpectomy/reexcision: Pathology showed negative for residual malignancy. Left breast needle localized lumpectomy showed negative for residual malignancy.  Sentinel lymph node negative In addition the outer slides on this patient's left breast biopsy were requested and reviewed by Dr. Oneita Kras.  Dr. Oneita Kras feels the patient's biopsy showed high-grade DCIS with focal areas suspicious for invasion.  Definitive invasive carcinoma is not identified.  DCIS is present in both outside blocks and slides from biopsy and measures 7 mm and 6 mm respectively.  pTis pN0 Patient case have been discussed on breast tumor board multiple times with reviewing of images and pathology findings. Left breast MRI did show a 1.4 cm irregularity, and pathology only 7 mm and a 6 mm DCIS were found at the initial left breast biopsy.  Final left breast lumpectomy did not contain clips which was placed with MRI guidance at biopsy.. Per my discussion with surgeon Dr.Sakai, even clips are found on repeat image, re-excision is not feasible and the next step will be mastectomy.   # 07/03/2018-07/06/2018 admitted for pain control, sentinel lymph node biopsy site hematoma,  #07/10/2018 -  07/14/2018 Patient admitted due to acute on chronic anemia due to hematoma.  She was admitted for evacuation of postoperative left axillary sentinel lymph node biopsy site hematoma.  Status post PRBC transfusion during her admission. # 08/14/2018-08/17/2018 readmitted due to breast cellulitis despite outpatient drainage and oral antibiotics.  # 08/24/2018- 08/28/2018 readmitted due to recurrent right breast abscess. She was placed on IV antibiotics with Ceftriaxone via picc line.  #A repeat US has been done on 09/07/2018 and it shows near resolution of the fluid. # #Finished IV Ceftriaxone on 09/11/2018.   Patient has a history of chronic respiratory failure/ COPD, on home oxygen.  # Right breast pT1c pN0, reexcision did not reveal any residual disease.. Oncotype DX showed recurrence rate of 31, patient has 15% benefit from adjuvant chemotherapy.  Decision was made not to proceed with adjuvant chemotherapy due to significant delays secondary to surgical complication, slow recovery from surgery, borderline performance status and comorbidities.  Patient declined radiation due to severe lung condition and is afraid of lung toxicities.[name]  #Left breast high-grade DCIS She has been tolerating antiestrogen treatment with Arimidex very well.  Manageable side effects. left breast biopsy clip was not found in the specimen. Continue Arimidex, tolerates well.   10/27/2018 left unilateral diagnostic mammogram showed left breast lower inner quadrant clip lstill presents results was discussed with patient and patient's surgeon Dr. Tonna Boehringer.  Dr. Tonna Boehringer feels the only option will be mastectomy. Discussed with patient that there is chance of remaining cancer in her left breast in the area where the  clip was placed. Patient is not interested in additional breast surgeries.  Continue antiestrogen treatments and surveillance mammogram.  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 Recommend  patient to follow-up with primary care provider.  If it continues to bother her she needs to see a surgery for evaluation and resection.  #09/17/2018 Started on ArimideX # 10/23/2020, MRI breast bilaterally showed indeterminate stippled linear non-mass enhancement involving the lower inner quadrant of the left breast extending from the prior lumpectomy site anteriorly, spanning 2.5 cm.  Indeterminate 0.7 cm mass involving the lower outer quadrant of the left breast at a posterior depth.  Indeterminate 0.9 cm masslike enhancement involving the right middle lobe laterally.  No pathologic lymphadenopathy.  No MRI evidence of malignancy involving the right breast.  # 10/30/2020  biopsy of the linear none mass enhancement in the lower inner posterior left breast.  The MRI biopsy of the second edition was unable to be performed Pathology showed high-grade DCIS of the left breast, the other smaller mass left outer quadrant not able to biopsied. Addendum, Hormone receptor status was added to her 10/30/2020 , results showed ER 95%, PR 0% She is currently on Arimidex and I recommend patient to switch to other AI.   12/01/2020, left mastectomy with sentinel lymph node biopsy. Focal atypical ductal hyperplasia, background benign mammary parenchyma with fat necrosis.  Scar, and reactive keratinizing squamous metaplasia compatible with prior procedure site.  Clips x2 and biopsy sites identified.  Benign nipple/areolar complex.  Incidental seborrheic keratosis negative for definite DCIS and malignancy. 1 sentinel lymph nodes were negative   11/07/2020 CT chest wo contrast showed no evidence of metastatic disease within the thorax.  No masses or pathologically enlarged lymph nodes identified on this unenhanced exam.  Patient has multiple tiny calcified granulomas noted in the right upper lobe.  Mild scarring is seen in the right upper and middle lobes and inferior lingula.  No suspicious nodules or masses  identified.  04/20/2021, given that patient developed recurrence while on Arimidex.  AI was switched to letrozole.   INTERVAL HISTORY Amy Woodard is a 82 y.o. female who has above history reviewed by me today presents for follow up visit for management of breast cancer. Patient tolerates letrozole well.  Side effects are manageable. Chronic shortness of breath, unchanged.  Patient has no new breast concerns. Skin lesion on her right upper arm not healing.   Review of Systems  Constitutional:  Negative for chills, fever, malaise/fatigue and weight loss.  HENT:  Negative for nosebleeds and sore throat.   Eyes:  Negative for double vision, photophobia and redness.  Respiratory:  Positive for shortness of breath. Negative for cough and wheezing.   Cardiovascular:  Negative for chest pain, palpitations, orthopnea and leg swelling.  Gastrointestinal:  Negative for abdominal pain, blood in stool, nausea and vomiting.  Genitourinary:  Negative for dysuria.  Musculoskeletal:  Negative for myalgias.  Skin:  Negative for itching and rash.  Neurological:  Negative for dizziness, tingling and tremors.  Endo/Heme/Allergies:  Negative for environmental allergies. Does not bruise/bleed easily.  Psychiatric/Behavioral:  Negative for depression and hallucinations.     MEDICAL HISTORY:  Past Medical History:  Diagnosis Date   Allergy    Aortic atherosclerosis (HCC)    Breast cancer (HCC) 05/2018   Right breast found 1st, then left breast with ductal ca   Cellulitis of breast 05/2018   right breast cellulitis after first biopsy,  antibiotics prescribed  COPD (chronic obstructive pulmonary disease) (HCC)    Coronary atherosclerosis    Diabetes mellitus without complication (HCC)    Dyspnea    GERD (gastroesophageal reflux disease)    occasionally   History of kidney stones    HLD (hyperlipidemia)    Hypertension    Hypothyroidism    Oxygen dependent    2liter nasal prong continuously    Pneumonia    Rheumatoid arthritis (HCC)    RLS (restless legs syndrome)     SURGICAL HISTORY: Past Surgical History:  Procedure Laterality Date   APPENDECTOMY     BACK SURGERY     lumbar. herniated disc . no metal   BREAST BIOPSY Right 03/19/2018   INVASIVE MAMMARY CARCINOMA, NO SPECIAL TYPE   BREAST BIOPSY Left 06/01/2018   MRI bx, grade III invasive ductal carcinoma   BREAST BIOPSY Right 06/01/2018   MRI bx of enhancing mass, path showed FOREIGN BODY GRANULOMATOUS RESPONSE    BREAST LUMPECTOMY Right 04/16/2018   [facility], DCIS, LN negative   BREAST LUMPECTOMY Right 07/03/2018   Procedure: RE-EXCISION OF RIGHT BREAST TISSUE MASS;  Surgeon: Sung Amabile, DO;  Location: [facility] ORS;  Service: General;  Laterality: Right;   BREAST LUMPECTOMY Left 06/2018   invasive ductal   IRRIGATION AND DEBRIDEMENT HEMATOMA Left 07/10/2018   Procedure: IRRIGATION AND DEBRIDEMENT HEMATOMA LEFT AXILLARY;  Surgeon: Sung Amabile, DO;  Location: [facility] ORS;  Service: General;  Laterality: Left;   PARTIAL MASTECTOMY WITH NEEDLE LOCALIZATION AND AXILLARY SENTINEL LYMPH NODE BX Right 04/16/2018   Procedure: PARTIAL MASTECTOMY WITH NEEDLE LOCALIZATION AND AXILLARY SENTINEL LYMPH NODE BX;  Surgeon: Sung Amabile, DO;  Location: [facility] ORS;  Service: General;  Laterality: Right;   PARTIAL MASTECTOMY WITH NEEDLE LOCALIZATION AND AXILLARY SENTINEL LYMPH NODE BX Left 07/03/2018   Procedure: PARTIAL MASTECTOMY WITH NEEDLE LOCALIZATION AND SENTINEL LYMPH NODE BX;  Surgeon: Sung Amabile, DO;  Location: [facility] ORS;  Service: General;  Laterality: Left;   TOTAL MASTECTOMY Left 12/01/2020   Procedure: TOTAL MASTECTOMY;  Surgeon: Sung Amabile, DO;  Location: [facility] ORS;  Service: General;  Laterality: Left;    SOCIAL HISTORY: Social History   Socioeconomic History   Marital status: Married    Spouse name: richard..disabled   Number of children: Not on file   Years of education: Not on file   Highest education level: Not on file   Occupational History   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  Tobacco Use   Smoking status: Former    Current packs/day: 0.00    Types: Cigarettes    Quit date: 03/30/1993    Years since quitting: 30.1   Smokeless tobacco: Never  Vaping Use   Vaping status: Never Used  Substance and Sexual Activity   Alcohol use: No   Drug use: Never   Sexual activity: Not Currently  Other Topics Concern   Not on file  Social History Narrative   Not on file   Social Determinants of Health   Financial Resource Strain: Low Risk  (04/22/2023)   Received from [facility]   Overall Financial Resource Strain (CARDIA)    Difficulty of Paying Living Expenses: Not hard at all  Food Insecurity: No Food Insecurity (04/22/2023)   Received from [facility]   Hunger Vital Sign    Worried About Running Out of Food in the Last Year: Never true    Ran Out of Food in the Last Year: Never true  Transportation  Needs: No Transportation Needs (04/22/2023)   Received from [facility] - Transportation    In the past 12 months, has lack of transportation kept you from medical appointments or from getting medications?: No    Lack of Transportation (Non-Medical): No  Physical Activity: Sufficiently Active (07/03/2018)   Exercise Vital Sign    Days of Exercise per Week: 7 days    Minutes of Exercise per Session: 30 min  Stress: No Stress Concern Present (07/03/2018)   [employer] of Occupational Health - Occupational Stress Questionnaire    Feeling of Stress : Not at all  Social Connections: Moderately Integrated (07/03/2018)   Social Connection and Isolation Panel [NHANES]    Frequency of Communication with Friends and Family: More than three times a week    Frequency of Social Gatherings with Friends and Family: More than three times a week    Attends Religious Services: 1 to 4 times per year    Active Member of [employer] or  Organizations: No    Attends Banker Meetings: Never    Marital Status: Married  Catering manager Violence: Not At Risk (07/03/2018)   Humiliation, Afraid, Rape, and Kick questionnaire    Fear of Current or Ex-Partner: No    Emotionally Abused: No    Physically Abused: No    Sexually Abused: No    FAMILY HISTORY: Family History  Problem Relation Age of Onset   Clotting disorder Mother    Melanoma Mother        deceased 63   Lung cancer Sister        deceased 61s; smoker   Alcohol abuse Father        deceased 62   Cervical cancer Other        neice   Breast cancer Neg Hx     ALLERGIES:  is allergic to leflunomide and sulfa antibiotics.  MEDICATIONS:  Current Outpatient Medications  Medication Sig Dispense Refill   azelastine (ASTELIN) 0.1 % nasal spray Place 1 spray into both nostrils 2 (two) times daily.     fluticasone (FLONASE) 50 MCG/ACT nasal spray Place 2 sprays into both nostrils daily as needed for allergies.     Fluticasone-Umeclidin-Vilant (TRELEGY ELLIPTA) 100-62.5-25 MCG/ACT AEPB Inhale into the lungs.     folic acid (FOLVITE) 1 MG tablet Take 1 mg by mouth daily.     glipiZIDE (GLUCOTROL XL) 5 MG 24 hr tablet Take 5 mg by mouth daily.     hydroxychloroquine (PLAQUENIL) 200 MG tablet Take 400 mg by mouth daily.      Ipratropium-Albuterol (COMBIVENT) 20-100 MCG/ACT AERS respimat Inhale 1 puff into the lungs every 6 (six) hours as needed for wheezing or shortness of breath.     irbesartan (AVAPRO) 75 MG tablet Take 75 mg by mouth daily.     levothyroxine (SYNTHROID, LEVOTHROID) 50 MCG tablet Take 50 mcg by mouth daily before breakfast.     methotrexate (RHEUMATREX) 2.5 MG tablet Take by mouth. Take 4 tablets (10 mg total) by mouth every 7 (seven) days     omeprazole (PRILOSEC) 20 MG capsule Take 20 mg by mouth daily as needed.     VITAMIN D3 50 MCG (2000 UT) capsule TAKE 1 CAPSULE BY MOUTH EVERY DAY 100 capsule 1   HYDROcodone-acetaminophen  (NORCO/VICODIN) 5-325 MG tablet Take 1-2 tablets by mouth every 4 (four) hours as needed for moderate pain. (Patient not taking: Reported on 05/30/2023) 20 tablet 0   letrozole [facility])  2.5 MG tablet Take 1 tablet (2.5 mg total) by mouth daily. 90 tablet 1   No current facility-administered medications for this visit.     PHYSICAL EXAMINATION: ECOG PERFORMANCE STATUS: 1 - Symptomatic but completely ambulatory Vitals:   05/30/23 1144  BP: 118/61  Pulse: 79  Resp: 18  Temp: 98 F (36.7 C)   Filed Weights   05/30/23 1144  Weight: 148 lb 4.8 oz (67.3 kg)    Physical Exam Constitutional:      General: She is not in acute distress.    Comments: On home oxygen  HENT:     Head: Normocephalic and atraumatic.  Eyes:     General: No scleral icterus.    Conjunctiva/sclera: Conjunctivae normal.     Pupils: Pupils are equal, round, and reactive to light.  Cardiovascular:     Rate and Rhythm: Normal rate and regular rhythm.     Heart sounds: Normal heart sounds.  Pulmonary:     Effort: Pulmonary effort is normal. No respiratory distress.     Comments: Decreased breath sound bilaterally Abdominal:     General: Bowel sounds are normal. There is no distension.     Palpations: Abdomen is soft. There is no mass.     Tenderness: There is no abdominal tenderness.  Musculoskeletal:        General: No deformity. Normal range of motion.     Cervical back: Normal range of motion and neck supple.  Lymphadenopathy:     Cervical: No cervical adenopathy.  Skin:    General: Skin is warm and dry.     Findings: No erythema or rash.     Comments: Left side posterior upper chest wall soft tissue nodule previously worked up which is likely a lipoma  Neurological:     General: No focal deficit present.     Mental Status: She is alert and oriented to person, place, and time.     Cranial Nerves: No cranial nerve deficit.     Coordination: Coordination normal.  Psychiatric:        Mood and Affect:  Mood normal.        LABORATORY DATA:  I have reviewed the data as listed    Latest Ref Rng & Units 05/30/2023   11:34 AM 11/27/2022    9:40 AM 11/15/2021    2:11 PM  CBC  WBC 4.0 - 10.5 K/uL 9.1  8.2  8.1   Hemoglobin 12.0 - 15.0 g/dL 56.2  13.0  86.5   Hematocrit 36.0 - 46.0 % 43.4  42.2  43.5   Platelets 150 - 400 K/uL 332  295  282       Latest Ref Rng & Units 05/30/2023   11:34 AM 11/27/2022    9:40 AM 11/15/2021    2:11 PM  CMP  Glucose 70 - 99 mg/dL 784  696  295   BUN 8 - 23 mg/dL 14  12  14    Creatinine 0.44 - 1.00 mg/dL 2.84  1.32  4.40   Sodium 135 - 145 mmol/L 140  139  136   Potassium 3.5 - 5.1 mmol/L 3.9  3.5  4.4   Chloride 98 - 111 mmol/L 101  101  95   CO2 22 - 32 mmol/L 30  30  33   Calcium 8.9 - 10.3 mg/dL 9.3  9.1  9.4   Total Protein 6.5 - 8.1 g/dL 7.5  7.2  7.3   Total Bilirubin 0.3 - 1.2 mg/dL 0.5  0.5  0.5   Alkaline Phos 38 - 126 U/L 57  57  55   AST 15 - 41 U/L 17  19  15    ALT 0 - 44 U/L 12  13  12

## 2023-05-30 NOTE — Assessment & Plan Note (Addendum)
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 Oncotype DX showed recurrence rate of 31 Shared decision was made not to proceed with adjuvant chemotherapy due to significant delays secondary to surgical complication, slow recovery from surgery, borderline performance status and comorbidities Continue annual right breast mammogram for surveillance.  Discussed with patient regarding extending endocrine therapy beyond 5 years given her high Oncotype Dx score. She agrees. Continue Letrozole 2.5mg  daily   -Right axilla cystic lesion-ultrasound findings is consistent with epidermal inclusion cyst.  Patient previously declineds dermatology evaluation. Today she agrees with dermatology referral

## 2023-05-30 NOTE — Assessment & Plan Note (Addendum)
Patient has previously declined bisphosphonate.  02/28/2023  DEXA showed osteopenia, probability of a major osteoporotic fracture is 16.0% within the next ten years. I discussed with patient with bisphosphonate, she adamantly declined.  Recommend calcium 1200 mg daily and vitamin D supplementation.

## 2023-05-30 NOTE — Assessment & Plan Note (Addendum)
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  Continue letrozole 2.5 mg daily.  Refills sent.

## 2023-06-03 ENCOUNTER — Other Ambulatory Visit: Payer: Self-pay | Admitting: Oncology

## 2023-06-10 DIAGNOSIS — Z79899 Other long term (current) drug therapy: Secondary | ICD-10-CM | POA: Diagnosis not present

## 2023-06-10 DIAGNOSIS — M0630 Rheumatoid nodule, unspecified site: Secondary | ICD-10-CM | POA: Diagnosis not present

## 2023-06-10 DIAGNOSIS — M0579 Rheumatoid arthritis with rheumatoid factor of multiple sites without organ or systems involvement: Secondary | ICD-10-CM | POA: Diagnosis not present

## 2023-06-16 DIAGNOSIS — D485 Neoplasm of uncertain behavior of skin: Secondary | ICD-10-CM | POA: Diagnosis not present

## 2023-06-16 DIAGNOSIS — L720 Epidermal cyst: Secondary | ICD-10-CM | POA: Diagnosis not present

## 2023-06-16 DIAGNOSIS — C44622 Squamous cell carcinoma of skin of right upper limb, including shoulder: Secondary | ICD-10-CM | POA: Diagnosis not present

## 2023-06-27 DIAGNOSIS — J449 Chronic obstructive pulmonary disease, unspecified: Secondary | ICD-10-CM | POA: Diagnosis not present

## 2023-07-15 DIAGNOSIS — C44622 Squamous cell carcinoma of skin of right upper limb, including shoulder: Secondary | ICD-10-CM | POA: Diagnosis not present

## 2023-07-28 DIAGNOSIS — J449 Chronic obstructive pulmonary disease, unspecified: Secondary | ICD-10-CM | POA: Diagnosis not present

## 2023-08-27 DIAGNOSIS — J449 Chronic obstructive pulmonary disease, unspecified: Secondary | ICD-10-CM | POA: Diagnosis not present

## 2023-09-27 DIAGNOSIS — J449 Chronic obstructive pulmonary disease, unspecified: Secondary | ICD-10-CM | POA: Diagnosis not present

## 2023-10-03 DIAGNOSIS — E119 Type 2 diabetes mellitus without complications: Secondary | ICD-10-CM | POA: Diagnosis not present

## 2023-10-03 DIAGNOSIS — E034 Atrophy of thyroid (acquired): Secondary | ICD-10-CM | POA: Diagnosis not present

## 2023-10-28 DIAGNOSIS — J449 Chronic obstructive pulmonary disease, unspecified: Secondary | ICD-10-CM | POA: Diagnosis not present

## 2023-11-06 DIAGNOSIS — E7800 Pure hypercholesterolemia, unspecified: Secondary | ICD-10-CM | POA: Diagnosis not present

## 2023-11-06 DIAGNOSIS — E119 Type 2 diabetes mellitus without complications: Secondary | ICD-10-CM | POA: Diagnosis not present

## 2023-11-06 DIAGNOSIS — C50512 Malignant neoplasm of lower-outer quadrant of left female breast: Secondary | ICD-10-CM | POA: Diagnosis not present

## 2023-11-06 DIAGNOSIS — J449 Chronic obstructive pulmonary disease, unspecified: Secondary | ICD-10-CM | POA: Diagnosis not present

## 2023-11-06 DIAGNOSIS — M25512 Pain in left shoulder: Secondary | ICD-10-CM | POA: Diagnosis not present

## 2023-11-06 DIAGNOSIS — I10 Essential (primary) hypertension: Secondary | ICD-10-CM | POA: Diagnosis not present

## 2023-11-06 DIAGNOSIS — E034 Atrophy of thyroid (acquired): Secondary | ICD-10-CM | POA: Diagnosis not present

## 2023-11-06 DIAGNOSIS — M0579 Rheumatoid arthritis with rheumatoid factor of multiple sites without organ or systems involvement: Secondary | ICD-10-CM | POA: Diagnosis not present

## 2023-11-06 DIAGNOSIS — J9611 Chronic respiratory failure with hypoxia: Secondary | ICD-10-CM | POA: Diagnosis not present

## 2023-11-11 ENCOUNTER — Other Ambulatory Visit: Payer: Self-pay | Admitting: Oncology

## 2023-11-13 ENCOUNTER — Other Ambulatory Visit: Payer: Self-pay

## 2023-11-13 ENCOUNTER — Emergency Department

## 2023-11-13 ENCOUNTER — Emergency Department
Admission: EM | Admit: 2023-11-13 | Discharge: 2023-11-13 | Disposition: A | Discharge status: Home / Self Care | Attending: Emergency Medicine | Admitting: Emergency Medicine

## 2023-11-13 DIAGNOSIS — S46812A Strain of other muscles, fascia and tendons at shoulder and upper arm level, left arm, initial encounter: Secondary | ICD-10-CM | POA: Diagnosis not present

## 2023-11-13 DIAGNOSIS — M25512 Pain in left shoulder: Secondary | ICD-10-CM | POA: Diagnosis not present

## 2023-11-13 DIAGNOSIS — W1830XA Fall on same level, unspecified, initial encounter: Secondary | ICD-10-CM | POA: Diagnosis not present

## 2023-11-13 DIAGNOSIS — I10 Essential (primary) hypertension: Secondary | ICD-10-CM | POA: Diagnosis not present

## 2023-11-13 MED ORDER — LIDOCAINE 5 % EX PTCH: 1.0000 | MEDICATED_PATCH | CUTANEOUS | 0 refills | Status: AC

## 2023-11-13 MED ORDER — LIDOCAINE 5 % EX PTCH
1.0000 | MEDICATED_PATCH | Freq: Once | CUTANEOUS | Status: DC
  Administered 2023-11-13: 1 via TRANSDERMAL
  Filled 2023-11-13: qty 1

## 2023-11-13 NOTE — Discharge Instructions (Signed)
 Please follow-up with orthopedics for ongoing management and assessment outpatient.  You can use the Lidoderm patches I have sent to your pharmacy as needed if they help with your pain symptoms.

## 2023-11-13 NOTE — ED Provider Notes (Signed)
 [facility] Provider Note    Event Date/Time   First MD Initiated Contact with Patient 11/13/23 1335     (approximate)   History   Shoulder Pain   HPI Amy Woodard is a 83 y.o. female presenting today for shoulder pain.  Patient reports she had a fall a couple months ago and since then has had intermittent pain over her left shoulder.  Has not been evaluated for it yet.  She notices most of the pain in the upper back region and had some swelling at the site.  She denies specific pain at the actual shoulder itself or at her neck.  No new trauma.  No numbness or tingling.  No other injuries that she has had.  Prior history of breast cancer which is in remission.     Physical Exam   Triage Vital Signs: ED Triage Vitals  Encounter Vitals Group     BP 11/13/23 1253 (!) 101/47     Systolic BP Percentile --      Diastolic BP Percentile --      Pulse Rate 11/13/23 1253 72     Resp 11/13/23 1253 16     Temp 11/13/23 1253 97.8 F (36.6 C)     Temp Source 11/13/23 1253 Oral     SpO2 11/13/23 1253 92 %     Weight 11/13/23 1252 145 lb (65.8 kg)     Height --      Head Circumference --      Peak Flow --      Pain Score 11/13/23 1252 4     Pain Loc --      Pain Education --      Exclude from Growth Chart --     Most recent vital signs: Vitals:   11/13/23 1253  BP: (!) 101/47  Pulse: 72  Resp: 16  Temp: 97.8 F (36.6 C)  SpO2: 92%   I have reviewed the vital signs. General:  Awake, alert, no acute distress. Head:  Normocephalic, Atraumatic. EENT:  PERRL, EOMI, Oral mucosa pink and moist, Neck is supple. Cardiovascular: Regular rate, 2+ distal pulses. Respiratory:  Normal respiratory effort, symmetrical expansion, no distress.   Extremities:  Moving all four extremities through full ROM without pain.  No pain throughout entirety of left upper extremity including with range of motion.  No C-spine tenderness palpation.  There is mild fluid  collection over the upper left trapezius.  This is where her pain symptoms are at.  No obvious erythema or warmth Neuro:  Alert and oriented.  Interacting appropriately.   Skin:  Warm, dry, no rash.   Psych: Appropriate affect.    ED Results / Procedures / Treatments   Labs (all labs ordered are listed, but only abnormal results are displayed) Labs Reviewed - No data to display   EKG    RADIOLOGY Independently interpreted x-ray with no acute pathology   PROCEDURES:  Critical Care performed: No  Procedures   MEDICATIONS ORDERED IN ED: Medications  lidocaine (LIDODERM) 5 % 1 patch (has no administration in time range)     IMPRESSION / MDM / ASSESSMENT AND PLAN / ED COURSE  I reviewed the triage vital signs and the nursing notes.                              Differential diagnosis includes, but is not limited to, lipoma, muscle strain, muscle tear, low suspicion  for osseous injury  Patient's presentation is most consistent with acute complicated illness / injury requiring diagnostic workup.  Patient is an 83 year old female presenting today for pain in her left upper back.  This is localized to a small spot of swelling in her upper left trapezius.  No surrounding erythema to suspect cellulitis.  She is nontender throughout her entire left arm specifically her left shoulder.  No cervical spine tenderness.  When motioning her left upper extremity she notices some pain at the site of where the small swelling is at.  Difficult to discern if this is hematoma versus muscle tear versus lipoma.  X-ray without any acute pathology of her shoulder.  Will give her Lidoderm patch today and have her follow-up with orthopedics for ongoing outpatient management.  Patient agreeable with this plan.     FINAL CLINICAL IMPRESSION(S) / ED DIAGNOSES   Final diagnoses:  Strain of left trapezius muscle, initial encounter     Rx / DC Orders   ED Discharge Orders          Ordered     lidocaine (LIDODERM) 5 %  Every 24 hours        11/13/23 1415    AMB referral to orthopedics        11/13/23 1415             Note:  This document was prepared using Dragon voice recognition software and may include unintentional dictation errors.   Janith Lima, MD 11/13/23 [phone]

## 2023-11-13 NOTE — ED Notes (Signed)
 See triage notes. Patient c/o left shoulder pain secondary to a fall a few months ago. Patient has had a mastectomy since the fall on the same side. Patient also has a raised area to the shoulder.

## 2023-11-13 NOTE — ED Triage Notes (Signed)
 Pt arrives with c/o left shoulder pain that has been going on for a few months. Pt had a fall and landed on her shoulder and the pain has gotten progressively worse over the past month or so. Per pt, pain radiates into her neck and causes headaches.

## 2023-11-20 DIAGNOSIS — I10 Essential (primary) hypertension: Secondary | ICD-10-CM | POA: Diagnosis not present

## 2023-11-20 DIAGNOSIS — E119 Type 2 diabetes mellitus without complications: Secondary | ICD-10-CM | POA: Diagnosis not present

## 2023-11-20 DIAGNOSIS — M0579 Rheumatoid arthritis with rheumatoid factor of multiple sites without organ or systems involvement: Secondary | ICD-10-CM | POA: Diagnosis not present

## 2023-11-20 DIAGNOSIS — J449 Chronic obstructive pulmonary disease, unspecified: Secondary | ICD-10-CM | POA: Diagnosis not present

## 2023-11-20 DIAGNOSIS — J40 Bronchitis, not specified as acute or chronic: Secondary | ICD-10-CM | POA: Diagnosis not present

## 2023-11-21 ENCOUNTER — Other Ambulatory Visit: Payer: PPO

## 2023-11-21 ENCOUNTER — Ambulatory Visit: Payer: PPO | Admitting: Oncology

## 2023-11-24 ENCOUNTER — Ambulatory Visit
Admission: RE | Admit: 2023-11-24 | Discharge: 2023-11-24 | Disposition: A | Discharge status: Home / Self Care | Payer: PPO | Source: Ambulatory Visit | Attending: Oncology | Admitting: Oncology

## 2023-11-24 DIAGNOSIS — Z853 Personal history of malignant neoplasm of breast: Secondary | ICD-10-CM

## 2023-11-24 DIAGNOSIS — Z1231 Encounter for screening mammogram for malignant neoplasm of breast: Secondary | ICD-10-CM | POA: Diagnosis not present

## 2023-11-25 DIAGNOSIS — J449 Chronic obstructive pulmonary disease, unspecified: Secondary | ICD-10-CM | POA: Diagnosis not present

## 2023-11-28 ENCOUNTER — Ambulatory Visit: Payer: PPO | Admitting: Oncology

## 2023-11-28 ENCOUNTER — Other Ambulatory Visit: Payer: PPO

## 2023-12-04 ENCOUNTER — Inpatient Hospital Stay: Payer: PPO | Attending: Oncology

## 2023-12-04 ENCOUNTER — Encounter: Payer: Self-pay | Admitting: Oncology

## 2023-12-04 ENCOUNTER — Inpatient Hospital Stay: Payer: PPO | Admitting: Oncology

## 2023-12-04 VITALS — BP 116/54 | HR 84 | Temp 98.7°F | Resp 17 | Wt 129.3 lb

## 2023-12-04 DIAGNOSIS — M7989 Other specified soft tissue disorders: Secondary | ICD-10-CM

## 2023-12-04 DIAGNOSIS — M8580 Other specified disorders of bone density and structure, unspecified site: Secondary | ICD-10-CM | POA: Insufficient documentation

## 2023-12-04 DIAGNOSIS — Z1732 Human epidermal growth factor receptor 2 negative status: Secondary | ICD-10-CM | POA: Insufficient documentation

## 2023-12-04 DIAGNOSIS — Z1721 Progesterone receptor positive status: Secondary | ICD-10-CM | POA: Diagnosis not present

## 2023-12-04 DIAGNOSIS — Z853 Personal history of malignant neoplasm of breast: Secondary | ICD-10-CM

## 2023-12-04 DIAGNOSIS — Z79811 Long term (current) use of aromatase inhibitors: Secondary | ICD-10-CM | POA: Diagnosis not present

## 2023-12-04 DIAGNOSIS — Z86000 Personal history of in-situ neoplasm of breast: Secondary | ICD-10-CM | POA: Diagnosis not present

## 2023-12-04 DIAGNOSIS — R222 Localized swelling, mass and lump, trunk: Secondary | ICD-10-CM | POA: Insufficient documentation

## 2023-12-04 DIAGNOSIS — Z170 Estrogen receptor positive status [ER+]: Secondary | ICD-10-CM | POA: Insufficient documentation

## 2023-12-04 DIAGNOSIS — Z9012 Acquired absence of left breast and nipple: Secondary | ICD-10-CM | POA: Diagnosis not present

## 2023-12-04 DIAGNOSIS — F4321 Adjustment disorder with depressed mood: Secondary | ICD-10-CM | POA: Insufficient documentation

## 2023-12-04 DIAGNOSIS — C50411 Malignant neoplasm of upper-outer quadrant of right female breast: Secondary | ICD-10-CM | POA: Diagnosis not present

## 2023-12-04 DIAGNOSIS — D0510 Intraductal carcinoma in situ of unspecified breast: Secondary | ICD-10-CM | POA: Diagnosis not present

## 2023-12-04 LAB — CBC WITH DIFFERENTIAL (CANCER CENTER ONLY)
Abs Immature Granulocytes: 0.04 10*3/uL (ref 0.00–0.07)
Basophils Absolute: 0.1 10*3/uL (ref 0.0–0.1)
Basophils Relative: 1 %
Eosinophils Absolute: 0.5 10*3/uL (ref 0.0–0.5)
Eosinophils Relative: 6 %
HCT: 41.8 % (ref 36.0–46.0)
Hemoglobin: 13 g/dL (ref 12.0–15.0)
Immature Granulocytes: 0 %
Lymphocytes Relative: 18 %
Lymphs Abs: 1.8 10*3/uL (ref 0.7–4.0)
MCH: 29.5 pg (ref 26.0–34.0)
MCHC: 31.1 g/dL (ref 30.0–36.0)
MCV: 95 fL (ref 80.0–100.0)
Monocytes Absolute: 1 10*3/uL (ref 0.1–1.0)
Monocytes Relative: 10 %
Neutro Abs: 6.4 10*3/uL (ref 1.7–7.7)
Neutrophils Relative %: 65 %
Platelet Count: 344 10*3/uL (ref 150–400)
RBC: 4.4 MIL/uL (ref 3.87–5.11)
RDW: 13.9 % (ref 11.5–15.5)
WBC Count: 9.9 10*3/uL (ref 4.0–10.5)
nRBC: 0 % (ref 0.0–0.2)

## 2023-12-04 LAB — CMP (CANCER CENTER ONLY)
ALT: 10 U/L (ref 0–44)
AST: 15 U/L (ref 15–41)
Albumin: 3.7 g/dL (ref 3.5–5.0)
Alkaline Phosphatase: 51 U/L (ref 38–126)
Anion gap: 12 (ref 5–15)
BUN: 17 mg/dL (ref 8–23)
CO2: 27 mmol/L (ref 22–32)
Calcium: 8.8 mg/dL — ABNORMAL LOW (ref 8.9–10.3)
Chloride: 98 mmol/L (ref 98–111)
Creatinine: 1 mg/dL (ref 0.44–1.00)
GFR, Estimated: 57 mL/min — ABNORMAL LOW (ref >60)
Glucose, Bld: 101 mg/dL — ABNORMAL HIGH (ref 70–99)
Potassium: 4.5 mmol/L (ref 3.5–5.1)
Sodium: 137 mmol/L (ref 135–145)
Total Bilirubin: 0.5 mg/dL (ref 0.0–1.2)
Total Protein: 6.8 g/dL (ref 6.5–8.1)

## 2023-12-04 NOTE — Assessment & Plan Note (Addendum)
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 Oncotype DX showed recurrence rate of 31 Shared decision was made not to proceed with adjuvant chemotherapy due to significant delays secondary to surgical complication, slow recovery from surgery, borderline performance status and comorbidities Continue annual right breast mammogram for surveillance.  Patient is on extended endocrine therapy beyond 5 years given her high Oncotype Dx score. She agrees. Continue Letrozole 2.5mg  daily

## 2023-12-04 NOTE — Assessment & Plan Note (Addendum)
 02/28/2023  DEXA showed osteopenia, probability of a major osteoporotic fracture is 16.0% within the next ten years. I discussed with patient with bisphosphonate, she adamantly declined.  She declined switching ot Tamoxifen.  Recommend calcium 1200 mg daily and vitamin D supplementation.

## 2023-12-04 NOTE — Assessment & Plan Note (Addendum)
 Emotional support provided.  She declined [place] care

## 2023-12-04 NOTE — Assessment & Plan Note (Signed)
 Obtain ultrasound evaluation.

## 2023-12-04 NOTE — Assessment & Plan Note (Signed)
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  Continue letrozole 2.5 mg daily.  Refills sent.

## 2023-12-04 NOTE — Progress Notes (Signed)
 Hematology/Oncology Progress note [phone] [phone]      CHIEF COMPLAINTS/REASON FOR VISIT:  Follow up  of breast cancer  ASSESSMENT & PLAN:   Cancer Staging  Malignant neoplasm of upper-outer quadrant of right breast in female, estrogen receptor positive (HCC) Staging form: Breast, AJCC 8th Edition - Clinical: No stage assigned - Unsigned - Pathologic: Stage IA (pT1c, pN0, cM0, G3, ER+, PR+, HER2-) - Signed by Rickard Patience, MD on 12/23/2018   History of invasive breast cancer #Right breast invasive carcinoma [04/2018- lumpectomy], pT1c pN0. Oncotype DX showed recurrence rate of 31 Shared decision was made not to proceed with adjuvant chemotherapy due to significant delays secondary to surgical complication, slow recovery from surgery, borderline performance status and comorbidities Continue annual right breast mammogram for surveillance.  Patient is on extended endocrine therapy beyond 5 years given her high Oncotype Dx score. She agrees. Continue Letrozole 2.5mg  daily     Osteopenia 02/28/2023  DEXA showed osteopenia, probability of a major osteoporotic fracture is 16.0% within the next ten years. I discussed with patient with bisphosphonate, she adamantly declined.  She declined switching ot Tamoxifen.  Recommend calcium 1200 mg daily and vitamin D supplementation.  Grieving Emotional support provided.  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 Continue letrozole 2.5 mg daily.  Refills sent.   Mass of soft tissue of shoulder Obtain ultrasound evaluation.  Orders Placed This Encounter  Procedures   [place] CHEST SOFT TISSUE    Standing Status:   Future    Expected Date:   12/11/2023    Expiration Date:   12/03/2024    Reason for  Exam (SYMPTOM  OR DIAGNOSIS REQUIRED):   [place] left shoulder soft tissue mass    Preferred imaging location?:   [facility]   CMP (Cancer Center only)    Standing Status:   Future    Expected Date:   06/05/2024    Expiration Date:   12/03/2024   CBC with Differential (Cancer Center Only)    Standing Status:   Future    Expected Date:   06/05/2024    Expiration Date:   12/03/2024   Follow-up in 6 months. All questions were answered. The patient knows to call the clinic with any problems, questions or concerns.  Rickard Patience, MD, PhD [facility] Hematology Oncology 12/04/2023   HISTORY OF PRESENTING ILLNESS:  Amy Woodard is a  83 y.o.  female with PMH listed below who was referred to me for evaluation of newly diagnosed breast cancer. Patient had mammogram and ultrasound on 03/19/2018 which showed right breast 12:00 1.6 x 1.3 x 1.5 cm mass, no radiographically enlarged adenopathy.  Patient also report feeling a mass for the past couple of months. Biopsy pathology showed: Invasive mammary carcinoma, no special type, grade 3, DCIS present, lymphovascular invasion not identified, ER PR 90% positive, HER-2 negative  Nipple discharge: Denies Family history: Mother had melanoma, passed away at age of 31, sister had cervical cancer.  Sister with colon cancer, maternal grandmother sister OCP use: denies.  Estrogen and progesterone therapy: denies History of radiation to chest: denies.  Previous breast surgery: Denies  She has a complicated course of rest cancer diagnosis and surgery. # 04/16/2018 status post right breast mass lumpectomy and sentinel lymph node biopsy.  Pathology showed invasive mammary carcinoma 1.7 cm, DCIS, high-grade  with extensive involvement of lobules.  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  Sentinel lymph node biopsy negative. pT1c pN0. ER >90%, PR >90%, HER 2 negative.   05/14/2018 patient underwent MRI breast bilaterally to  determined extent of positive deep margin/lobular type breast cancer. MRI breast showed an irregular 1.4 cm mass in the lower inner quadrant of the left breast. Abnormal non-mass enhancement in the upper outer right breast, axillary tail region could be secondary to postsurgical changes, malignancy cannot be excluded. 06/05/2018 left breast mass biopsy showed invasive ductal carcinoma, grade 3, this was signed out by pathology group in [facility] Dr. Holley Bouche. ER 100%, PR 50%, not enough tissue for HER 2 analysis.  07/03/2018 patient underwent right breast deep margin lumpectomy/reexcision: Pathology showed negative for residual malignancy. Left breast needle localized lumpectomy showed negative for residual malignancy.  Sentinel lymph node negative In addition the outer slides on this patient's left breast biopsy were requested and reviewed by Dr. Oneita Kras.  Dr. Oneita Kras feels the patient's biopsy showed high-grade DCIS with focal areas suspicious for invasion.  Definitive invasive carcinoma is not identified.  DCIS is present in both outside blocks and slides from biopsy and measures 7 mm and 6 mm respectively.  pTis pN0 Patient case have been discussed on breast tumor board multiple times with reviewing of images and pathology findings. Left breast MRI did show a 1.4 cm irregularity, and pathology only 7 mm and a 6 mm DCIS were found at the initial left breast biopsy.  Final left breast lumpectomy did not contain clips which was placed with MRI guidance at biopsy.. Per my discussion with surgeon Dr.Sakai, even clips are found on repeat image, re-excision is not feasible and the next step will be mastectomy.   # 07/03/2018-07/06/2018 admitted for pain control, sentinel lymph node biopsy site hematoma,  #07/10/2018 - 07/14/2018 Patient admitted due to acute on chronic anemia due to hematoma.  She was admitted for evacuation of postoperative left axillary sentinel lymph node biopsy site hematoma.   Status post PRBC transfusion during her admission. # 08/14/2018-08/17/2018 readmitted due to breast cellulitis despite outpatient drainage and oral antibiotics.  # 08/24/2018- 08/28/2018 readmitted due to recurrent right breast abscess. She was placed on IV antibiotics with Ceftriaxone via picc line.  #A repeat US has been done on 09/07/2018 and it shows near resolution of the fluid. # #Finished IV Ceftriaxone on 09/11/2018.   Patient has a history of chronic respiratory failure/ COPD, on home oxygen.  # Right breast pT1c pN0, reexcision did not reveal any residual disease.. Oncotype DX showed recurrence rate of 31, patient has 15% benefit from adjuvant chemotherapy.  Decision was made not to proceed with adjuvant chemotherapy due to significant delays secondary to surgical complication, slow recovery from surgery, borderline performance status and comorbidities.  Patient declined radiation due to severe lung condition and is afraid of lung toxicities.[name]  #Left breast high-grade DCIS She has been tolerating antiestrogen treatment with Arimidex very well.  Manageable side effects. left breast biopsy clip was not found in the specimen. Continue Arimidex, tolerates well.   10/27/2018 left unilateral diagnostic mammogram showed left breast lower inner quadrant clip lstill presents results was discussed with patient and patient's surgeon Dr. Tonna Boehringer.  Dr. Tonna Boehringer feels the only option will be mastectomy. Discussed with patient that there is chance of remaining cancer in her left breast in the area where the clip was placed. Patient is not interested in additional breast surgeries.  Continue antiestrogen treatments and surveillance mammogram.  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 Recommend patient to follow-up with primary care provider.  If it continues to bother her she needs to see a surgery for evaluation and resection.  #09/17/2018 Started on ArimideX # 10/23/2020,  MRI breast bilaterally showed indeterminate stippled linear non-mass enhancement involving the lower inner quadrant of the left breast extending from the prior lumpectomy site anteriorly, spanning 2.5 cm.  Indeterminate 0.7 cm mass involving the lower outer quadrant of the left breast at a posterior depth.  Indeterminate 0.9 cm masslike enhancement involving the right middle lobe laterally.  No pathologic lymphadenopathy.  No MRI evidence of malignancy involving the right breast.  # 10/30/2020  biopsy of the linear none mass enhancement in the lower inner posterior left breast.  The MRI biopsy of the second edition was unable to be performed Pathology showed high-grade DCIS of the left breast, the other smaller mass left outer quadrant not able to biopsied. Addendum, Hormone receptor status was added to her 10/30/2020 , results showed ER 95%, PR 0% She is currently on Arimidex and I recommend patient to switch to other AI.   12/01/2020, left mastectomy with sentinel lymph node biopsy. Focal atypical ductal hyperplasia, background benign mammary parenchyma with fat necrosis.  Scar, and reactive keratinizing squamous metaplasia compatible with prior procedure site.  Clips x2 and biopsy sites identified.  Benign nipple/areolar complex.  Incidental seborrheic keratosis negative for definite DCIS and malignancy. 1 sentinel lymph nodes were negative   11/07/2020 CT chest wo contrast showed no evidence of metastatic disease within the thorax.  No masses or pathologically enlarged lymph nodes identified on this unenhanced exam.  Patient has multiple tiny calcified granulomas noted in the right upper lobe.  Mild scarring is seen in the right upper and middle lobes and inferior lingula.  No suspicious nodules or masses identified.  04/20/2021, given that patient developed recurrence while on Arimidex.  AI was switched to letrozole.   INTERVAL HISTORY Amy Woodard is a 83 y.o. female who has above history  reviewed by me today presents for follow up visit for management of breast cancer. Patient tolerates letrozole well.  Side effects are manageable. Chronic shortness of breath, unchanged.  Patient has no new breast concerns. She has noticed an area of soft tissue swelling around left shoulder. Her husband passed away in 12-04-23. She has lost weight.  Appetite is not great. She is trying to cope with the grieving process.  Review of Systems  Constitutional:  Positive for weight loss. Negative for chills, fever and malaise/fatigue.  HENT:  Negative for nosebleeds and sore throat.   Eyes:  Negative for double vision, photophobia and redness.  Respiratory:  Positive for shortness of breath. Negative for cough and wheezing.   Cardiovascular:  Negative for chest pain, palpitations, orthopnea and leg swelling.  Gastrointestinal:  Negative for abdominal pain, blood in stool, nausea and vomiting.  Genitourinary:  Negative for dysuria.  Musculoskeletal:  Negative for myalgias.  Skin:  Negative for itching and rash.  Neurological:  Negative for dizziness, tingling and tremors.  Endo/Heme/Allergies:  Negative for environmental allergies. Does not bruise/bleed easily.  Psychiatric/Behavioral:  Negative for hallucinations.     MEDICAL HISTORY:  Past Medical History:  Diagnosis Date   Allergy    Aortic atherosclerosis (HCC)    Breast cancer (HCC) 05/2018   Right breast found 1st, then left breast with ductal ca  Cellulitis of breast 05/2018   right breast cellulitis after first biopsy,  antibiotics prescribed   COPD (chronic obstructive pulmonary disease) (HCC)    Coronary atherosclerosis    Diabetes mellitus without complication (HCC)    Dyspnea    GERD (gastroesophageal reflux disease)    occasionally   History of kidney stones    HLD (hyperlipidemia)    Hypertension    Hypothyroidism    Oxygen dependent    2liter nasal prong continuously   Pneumonia    Rheumatoid arthritis  (HCC)    RLS (restless legs syndrome)     SURGICAL HISTORY: Past Surgical History:  Procedure Laterality Date   APPENDECTOMY     BACK SURGERY     lumbar. herniated disc . no metal   BREAST BIOPSY Right 03/19/2018   INVASIVE MAMMARY CARCINOMA, NO SPECIAL TYPE   BREAST BIOPSY Left 06/01/2018   MRI bx, grade III invasive ductal carcinoma   BREAST BIOPSY Right 06/01/2018   MRI bx of enhancing mass, path showed FOREIGN BODY GRANULOMATOUS RESPONSE    BREAST LUMPECTOMY Right 04/16/2018   [facility], DCIS, LN negative   BREAST LUMPECTOMY Right 07/03/2018   Procedure: RE-EXCISION OF RIGHT BREAST TISSUE MASS;  Surgeon: Sung Amabile, DO;  Location: [facility] ORS;  Service: General;  Laterality: Right;   BREAST LUMPECTOMY Left 06/2018   invasive ductal   IRRIGATION AND DEBRIDEMENT HEMATOMA Left 07/10/2018   Procedure: IRRIGATION AND DEBRIDEMENT HEMATOMA LEFT AXILLARY;  Surgeon: Sung Amabile, DO;  Location: [facility] ORS;  Service: General;  Laterality: Left;   PARTIAL MASTECTOMY WITH NEEDLE LOCALIZATION AND AXILLARY SENTINEL LYMPH NODE BX Right 04/16/2018   Procedure: PARTIAL MASTECTOMY WITH NEEDLE LOCALIZATION AND AXILLARY SENTINEL LYMPH NODE BX;  Surgeon: Sung Amabile, DO;  Location: [facility] ORS;  Service: General;  Laterality: Right;   PARTIAL MASTECTOMY WITH NEEDLE LOCALIZATION AND AXILLARY SENTINEL LYMPH NODE BX Left 07/03/2018   Procedure: PARTIAL MASTECTOMY WITH NEEDLE LOCALIZATION AND SENTINEL LYMPH NODE BX;  Surgeon: Sung Amabile, DO;  Location: [facility] ORS;  Service: General;  Laterality: Left;   TOTAL MASTECTOMY Left 12/01/2020   Procedure: TOTAL MASTECTOMY;  Surgeon: Sung Amabile, DO;  Location: [facility] ORS;  Service: General;  Laterality: Left;    SOCIAL HISTORY: Social History   Socioeconomic History   Marital status: Married    Spouse name: richard..disabled   Number of children: Not on file   Years of education: Not on file   Highest education level: Not on file  Occupational History   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  Tobacco Use   Smoking status: Former    Current packs/day: 0.00    Types: Cigarettes    Quit date: 03/30/1993    Years since quitting: 30.7   Smokeless tobacco: Never  Vaping Use   Vaping status: Never Used  Substance and Sexual Activity   Alcohol use: No   Drug use: Never   Sexual activity: Not Currently  Other Topics Concern   Not on file  Social History Narrative   Not on file   Social Drivers of Health   Financial Resource Strain: Low Risk  (04/22/2023)   Received from [facility]   Overall Financial Resource Strain (CARDIA)    Difficulty of Paying Living Expenses: Not hard at all  Food Insecurity: No Food Insecurity (04/22/2023)   Received from [facility]   Hunger Vital Sign    Worried About Running Out of Food in the Last Year:  Never true    Ran Out of Food in the Last Year: Never true  Transportation Needs: No Transportation Needs (04/22/2023)   Received from [facility] - Transportation    In the past 12 months, has lack of transportation kept you from medical appointments or from getting medications?: No    Lack of Transportation (Non-Medical): No  Physical Activity: Sufficiently Active (07/03/2018)   Exercise Vital Sign    Days of Exercise per Week: 7 days    Minutes of Exercise per Session: 30 min  Stress: No Stress Concern Present (07/03/2018)   [employer] of Occupational Health - Occupational Stress Questionnaire    Feeling of Stress : Not at all  Social Connections: Moderately Integrated (07/03/2018)   Social Connection and Isolation Panel [NHANES]    Frequency of Communication with Friends and Family: More than three times a week    Frequency of Social Gatherings with Friends and Family: More than three times a week    Attends Religious Services: 1 to 4 times per year    Active Member of [employer] or Organizations: No    Attends Tax inspector Meetings: Never    Marital Status: Married  Catering manager Violence: Not At Risk (07/03/2018)   Humiliation, Afraid, Rape, and Kick questionnaire    Fear of Current or Ex-Partner: No    Emotionally Abused: No    Physically Abused: No    Sexually Abused: No    FAMILY HISTORY: Family History  Problem Relation Age of Onset   Clotting disorder Mother    Melanoma Mother        deceased 29   Lung cancer Sister        deceased 64s; smoker   Alcohol abuse Father        deceased 86   Cervical cancer Other        neice   Breast cancer Neg Hx     ALLERGIES:  is allergic to leflunomide and sulfa antibiotics.  MEDICATIONS:  Current Outpatient Medications  Medication Sig Dispense Refill   azelastine (ASTELIN) 0.1 % nasal spray Place 1 spray into both nostrils 2 (two) times daily.     Cholecalciferol (VITAMIN D3) 50 MCG (2000 UT) capsule TAKE 1 CAPSULE BY MOUTH EVERY DAY 100 capsule 1   fluticasone (FLONASE) 50 MCG/ACT nasal spray Place 2 sprays into both nostrils daily as needed for allergies.     Fluticasone-Umeclidin-Vilant (TRELEGY ELLIPTA) 100-62.5-25 MCG/ACT AEPB Inhale into the lungs.     folic acid (FOLVITE) 1 MG tablet Take 1 mg by mouth daily.     glipiZIDE (GLUCOTROL XL) 5 MG 24 hr tablet Take 5 mg by mouth daily.     hydroxychloroquine (PLAQUENIL) 200 MG tablet Take 400 mg by mouth daily.      Ipratropium-Albuterol (COMBIVENT) 20-100 MCG/ACT AERS respimat Inhale 1 puff into the lungs every 6 (six) hours as needed for wheezing or shortness of breath.     irbesartan (AVAPRO) 75 MG tablet Take 75 mg by mouth daily.     letrozole (FEMARA) 2.5 MG tablet TAKE 1 TABLET BY MOUTH EVERY DAY 90 tablet 1   levothyroxine (SYNTHROID, LEVOTHROID) 50 MCG tablet Take 50 mcg by mouth daily before breakfast.     methotrexate (RHEUMATREX) 2.5 MG tablet Take by mouth. Take 4 tablets (10 mg total) by mouth every 7 (seven) days     omeprazole (PRILOSEC) 20 MG capsule Take 20 mg by  mouth daily as needed.  HYDROcodone-acetaminophen (NORCO/VICODIN) 5-325 MG tablet Take 1-2 tablets by mouth every 4 (four) hours as needed for moderate pain. (Patient not taking: Reported on 12/04/2023) 20 tablet 0   No current facility-administered medications for this visit.     PHYSICAL EXAMINATION: ECOG PERFORMANCE STATUS: 1 - Symptomatic but completely ambulatory Vitals:   12/04/23 1406  BP: (!) 116/54  Pulse: 84  Resp: 17  Temp: 98.7 F (37.1 C)  SpO2: 96%   Filed Weights   12/04/23 1406  Weight: 129 lb 4.8 oz (58.7 kg)    Physical Exam Constitutional:      General: She is not in acute distress.    Comments: On home oxygen  HENT:     Head: Normocephalic and atraumatic.  Eyes:     General: No scleral icterus.    Conjunctiva/sclera: Conjunctivae normal.     Pupils: Pupils are equal, round, and reactive to light.  Cardiovascular:     Rate and Rhythm: Normal rate and regular rhythm.     Heart sounds: Normal heart sounds.  Pulmonary:     Effort: Pulmonary effort is normal. No respiratory distress.     Comments: Decreased breath sound bilaterally Abdominal:     General: Bowel sounds are normal. There is no distension.     Palpations: Abdomen is soft. There is no mass.     Tenderness: There is no abdominal tenderness.  Musculoskeletal:        General: No deformity. Normal range of motion.     Cervical back: Normal range of motion and neck supple.  Lymphadenopathy:     Cervical: No cervical adenopathy.  Skin:    General: Skin is warm and dry.     Findings: No erythema or rash.     Comments: Left side posterior upper chest wall soft tissue nodule previously worked up which is likely a lipoma  Neurological:     General: No focal deficit present.     Mental Status: She is alert and oriented to person, place, and time.     Cranial Nerves: No cranial nerve deficit.     Coordination: Coordination normal.  Psychiatric:     Comments: Tearful        LABORATORY  DATA:  I have reviewed the data as listed    Latest Ref Rng & Units 12/04/2023    1:51 PM 05/30/2023   11:34 AM 11/27/2022    9:40 AM  CBC  WBC 4.0 - 10.5 K/uL 9.9  9.1  8.2   Hemoglobin 12.0 - 15.0 g/dL 16.1  09.6  04.5   Hematocrit 36.0 - 46.0 % 41.8  43.4  42.2   Platelets 150 - 400 K/uL 344  332  295       Latest Ref Rng & Units 12/04/2023    1:53 PM 05/30/2023   11:34 AM 11/27/2022    9:40 AM  CMP  Glucose 70 - 99 mg/dL 409  811  914   BUN 8 - 23 mg/dL 17  14  12    Creatinine 0.44 - 1.00 mg/dL 7.82  9.56  2.13   Sodium 135 - 145 mmol/L 137  140  139   Potassium 3.5 - 5.1 mmol/L 4.5  3.9  3.5   Chloride 98 - 111 mmol/L 98  101  101   CO2 22 - 32 mmol/L 27  30  30    Calcium 8.9 - 10.3 mg/dL 8.8  9.3  9.1   Total Protein 6.5 - 8.1 g/dL 6.8  7.5  7.2   Total Bilirubin 0.0 - 1.2 mg/dL 0.5  0.5  0.5   Alkaline Phos 38 - 126 U/L 51  57  57   AST 15 - 41 U/L 15  17  19    ALT 0 - 44 U/L 10  12  13

## 2023-12-11 ENCOUNTER — Ambulatory Visit
Admission: RE | Admit: 2023-12-11 | Discharge: 2023-12-11 | Disposition: A | Discharge status: Home / Self Care | Source: Ambulatory Visit | Attending: Oncology | Admitting: Oncology

## 2023-12-11 DIAGNOSIS — R2232 Localized swelling, mass and lump, left upper limb: Secondary | ICD-10-CM | POA: Diagnosis not present

## 2023-12-11 DIAGNOSIS — M7989 Other specified soft tissue disorders: Secondary | ICD-10-CM | POA: Diagnosis not present

## 2023-12-16 ENCOUNTER — Encounter: Payer: Self-pay | Admitting: Oncology

## 2023-12-26 DIAGNOSIS — J449 Chronic obstructive pulmonary disease, unspecified: Secondary | ICD-10-CM | POA: Diagnosis not present

## 2024-03-05 ENCOUNTER — Other Ambulatory Visit: Payer: Self-pay | Admitting: Oncology

## 2024-04-28 DIAGNOSIS — E034 Atrophy of thyroid (acquired): Secondary | ICD-10-CM | POA: Diagnosis not present

## 2024-04-28 DIAGNOSIS — E119 Type 2 diabetes mellitus without complications: Secondary | ICD-10-CM | POA: Diagnosis not present

## 2024-05-05 DIAGNOSIS — Z0001 Encounter for general adult medical examination with abnormal findings: Secondary | ICD-10-CM | POA: Diagnosis not present

## 2024-05-05 DIAGNOSIS — I10 Essential (primary) hypertension: Secondary | ICD-10-CM | POA: Diagnosis not present

## 2024-05-05 DIAGNOSIS — E7800 Pure hypercholesterolemia, unspecified: Secondary | ICD-10-CM | POA: Diagnosis not present

## 2024-05-05 DIAGNOSIS — J9611 Chronic respiratory failure with hypoxia: Secondary | ICD-10-CM | POA: Diagnosis not present

## 2024-05-05 DIAGNOSIS — J449 Chronic obstructive pulmonary disease, unspecified: Secondary | ICD-10-CM | POA: Diagnosis not present

## 2024-05-05 DIAGNOSIS — Z0000 Encounter for general adult medical examination without abnormal findings: Secondary | ICD-10-CM | POA: Diagnosis not present

## 2024-05-05 DIAGNOSIS — E119 Type 2 diabetes mellitus without complications: Secondary | ICD-10-CM | POA: Diagnosis not present

## 2024-05-05 DIAGNOSIS — Z1331 Encounter for screening for depression: Secondary | ICD-10-CM | POA: Diagnosis not present

## 2024-05-05 DIAGNOSIS — M0579 Rheumatoid arthritis with rheumatoid factor of multiple sites without organ or systems involvement: Secondary | ICD-10-CM | POA: Diagnosis not present

## 2024-05-05 DIAGNOSIS — E034 Atrophy of thyroid (acquired): Secondary | ICD-10-CM | POA: Diagnosis not present

## 2024-06-03 DIAGNOSIS — M0630 Rheumatoid nodule, unspecified site: Secondary | ICD-10-CM | POA: Diagnosis not present

## 2024-06-03 DIAGNOSIS — M0579 Rheumatoid arthritis with rheumatoid factor of multiple sites without organ or systems involvement: Secondary | ICD-10-CM | POA: Diagnosis not present

## 2024-06-03 DIAGNOSIS — Z79899 Other long term (current) drug therapy: Secondary | ICD-10-CM | POA: Diagnosis not present

## 2024-06-04 ENCOUNTER — Inpatient Hospital Stay: Admitting: Oncology

## 2024-06-04 ENCOUNTER — Telehealth: Payer: Self-pay | Admitting: Oncology

## 2024-06-04 ENCOUNTER — Inpatient Hospital Stay

## 2024-06-04 NOTE — Telephone Encounter (Signed)
 Pt no showed appts today (9/26). I called and spoke with pt and she thought her appt was in November. Appts have been r/s to November and date/time confirmed with pt.

## 2024-06-19 ENCOUNTER — Other Ambulatory Visit: Payer: Self-pay | Admitting: Oncology

## 2024-07-13 ENCOUNTER — Inpatient Hospital Stay: Attending: Oncology

## 2024-07-13 ENCOUNTER — Encounter: Payer: Self-pay | Admitting: Oncology

## 2024-07-13 ENCOUNTER — Inpatient Hospital Stay: Admitting: Oncology

## 2024-07-13 VITALS — BP 138/67 | HR 78 | Temp 98.2°F | Resp 16 | Wt 141.0 lb

## 2024-07-13 DIAGNOSIS — Z9981 Dependence on supplemental oxygen: Secondary | ICD-10-CM | POA: Insufficient documentation

## 2024-07-13 DIAGNOSIS — J449 Chronic obstructive pulmonary disease, unspecified: Secondary | ICD-10-CM | POA: Insufficient documentation

## 2024-07-13 DIAGNOSIS — Z8049 Family history of malignant neoplasm of other genital organs: Secondary | ICD-10-CM | POA: Diagnosis not present

## 2024-07-13 DIAGNOSIS — Z853 Personal history of malignant neoplasm of breast: Secondary | ICD-10-CM

## 2024-07-13 DIAGNOSIS — C50411 Malignant neoplasm of upper-outer quadrant of right female breast: Secondary | ICD-10-CM | POA: Insufficient documentation

## 2024-07-13 DIAGNOSIS — D0510 Intraductal carcinoma in situ of unspecified breast: Secondary | ICD-10-CM | POA: Diagnosis not present

## 2024-07-13 DIAGNOSIS — Z170 Estrogen receptor positive status [ER+]: Secondary | ICD-10-CM | POA: Insufficient documentation

## 2024-07-13 DIAGNOSIS — M8580 Other specified disorders of bone density and structure, unspecified site: Secondary | ICD-10-CM | POA: Diagnosis not present

## 2024-07-13 DIAGNOSIS — Z79811 Long term (current) use of aromatase inhibitors: Secondary | ICD-10-CM | POA: Insufficient documentation

## 2024-07-13 DIAGNOSIS — Z808 Family history of malignant neoplasm of other organs or systems: Secondary | ICD-10-CM | POA: Insufficient documentation

## 2024-07-13 DIAGNOSIS — C50811 Malignant neoplasm of overlapping sites of right female breast: Secondary | ICD-10-CM | POA: Diagnosis present

## 2024-07-13 LAB — CBC WITH DIFFERENTIAL (CANCER CENTER ONLY)
Abs Immature Granulocytes: 0.01 K/uL (ref 0.00–0.07)
Basophils Absolute: 0.1 K/uL (ref 0.0–0.1)
Basophils Relative: 2 %
Eosinophils Absolute: 0.5 K/uL (ref 0.0–0.5)
Eosinophils Relative: 6 %
HCT: 42.8 % (ref 36.0–46.0)
Hemoglobin: 13.3 g/dL (ref 12.0–15.0)
Immature Granulocytes: 0 %
Lymphocytes Relative: 31 %
Lymphs Abs: 2.3 K/uL (ref 0.7–4.0)
MCH: 27.6 pg (ref 26.0–34.0)
MCHC: 31.1 g/dL (ref 30.0–36.0)
MCV: 88.8 fL (ref 80.0–100.0)
Monocytes Absolute: 0.8 K/uL (ref 0.1–1.0)
Monocytes Relative: 11 %
Neutro Abs: 3.7 K/uL (ref 1.7–7.7)
Neutrophils Relative %: 50 %
Platelet Count: 355 K/uL (ref 150–400)
RBC: 4.82 MIL/uL (ref 3.87–5.11)
RDW: 13.2 % (ref 11.5–15.5)
WBC Count: 7.4 K/uL (ref 4.0–10.5)
nRBC: 0 % (ref 0.0–0.2)

## 2024-07-13 LAB — CMP (CANCER CENTER ONLY)
ALT: 14 U/L (ref 0–44)
AST: 19 U/L (ref 15–41)
Albumin: 4 g/dL (ref 3.5–5.0)
Alkaline Phosphatase: 59 U/L (ref 38–126)
Anion gap: 10 (ref 5–15)
BUN: 17 mg/dL (ref 8–23)
CO2: 30 mmol/L (ref 22–32)
Calcium: 8.9 mg/dL (ref 8.9–10.3)
Chloride: 99 mmol/L (ref 98–111)
Creatinine: 0.93 mg/dL (ref 0.44–1.00)
GFR, Estimated: >60 mL/min (ref >60)
Glucose, Bld: 106 mg/dL — ABNORMAL HIGH (ref 70–99)
Potassium: 4.3 mmol/L (ref 3.5–5.1)
Sodium: 139 mmol/L (ref 135–145)
Total Bilirubin: 0.4 mg/dL (ref 0.0–1.2)
Total Protein: 7.4 g/dL (ref 6.5–8.1)

## 2024-07-13 NOTE — Assessment & Plan Note (Signed)
 02/28/2023  DEXA showed osteopenia, probability of a major osteoporotic fracture is 16.0% within the next ten years. I discussed with patient with bisphosphonate, she adamantly declined.  She declined switching ot Tamoxifen.  Recommend calcium 1200 mg daily and vitamin D supplementation.

## 2024-07-13 NOTE — Assessment & Plan Note (Signed)
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 Oncotype DX showed recurrence rate of 31 Shared decision was made not to proceed with adjuvant chemotherapy due to significant delays secondary to surgical complication, slow recovery from surgery, borderline performance status and comorbidities Continue annual right breast mammogram for surveillance.  Patient is on extended endocrine therapy beyond 5 years given her high Oncotype Dx score. She agrees. Continue Letrozole 2.5mg  daily

## 2024-07-13 NOTE — Assessment & Plan Note (Signed)
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  Continue letrozole 2.5 mg daily.  Refills sent.

## 2024-07-13 NOTE — Progress Notes (Signed)
 Hematology/Oncology Progress note Telephone:([phone] Fax:([phone]) [phone]      CHIEF COMPLAINTS/REASON FOR VISIT:  Follow up  of breast cancer  ASSESSMENT & PLAN:   Cancer Staging  Malignant neoplasm of upper-outer quadrant of right breast in female, estrogen receptor positive (HCC) Staging form: Breast, AJCC 8th Edition - Clinical: No stage assigned - Unsigned - Pathologic: Stage IA (pT1c, pN0, cM0, G3, ER+, PR+, HER2-) - Signed by Babara Call, MD on 12/23/2018   History of invasive breast cancer #Right breast invasive carcinoma [04/2018- lumpectomy], pT1c pN0. Oncotype DX showed recurrence rate of 31 Shared decision was made not to proceed with adjuvant chemotherapy due to significant delays secondary to surgical complication, slow recovery from surgery, borderline performance status and comorbidities Continue annual right breast mammogram for surveillance.  Patient is on extended endocrine therapy beyond 5 years given her high Oncotype Dx score. She agrees. 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 Continue letrozole  2.5 mg daily.  Refills sent.   Osteopenia 02/28/2023  DEXA showed osteopenia, probability of a major osteoporotic fracture is 16.0% within the next ten years. I discussed with patient with bisphosphonate, she adamantly declined.  She declined switching ot Tamoxifen.  Recommend calcium  1200 mg daily and vitamin D  supplementation.  Orders Placed This Encounter  Procedures   MM 3D SCREENING MAMMOGRAM UNILATERAL RIGHT BREAST    Standing Status:   Future    Expected Date:   11/16/2024    Expiration Date:   02/14/2025    Reason for Exam (SYMPTOM  OR DIAGNOSIS REQUIRED):   Breast cancer    Preferred imaging location?:   [facility]   CMP (Cancer Center only)    Standing Status:   Future    Expected Date:   01/10/2025    Expiration Date:   04/10/2025   CBC with Differential (Cancer Center Only)    Standing Status:   Future    Expected Date:   01/10/2025    Expiration Date:   04/10/2025   Follow-up in 6 months. All questions were answered. The patient knows to call the clinic with any problems, questions or concerns.  Call Babara, MD, PhD [facility] Hematology Oncology 07/13/2024   HISTORY OF PRESENTING ILLNESS:  Amy Woodard is a  83 y.o.  female with PMH listed below who was referred to me for evaluation of newly diagnosed breast cancer. Patient had mammogram and ultrasound on 03/19/2018 which showed right breast 12:00 1.6 x 1.3 x 1.5 cm mass, no radiographically enlarged adenopathy.  Patient also report feeling a mass for the past couple of months. Biopsy pathology showed: Invasive mammary carcinoma, no special type, grade 3, DCIS present, lymphovascular invasion not identified, ER PR 90% positive, HER-2 negative  Nipple discharge: Denies Family history: Mother had melanoma, passed away at age of 72, sister had cervical cancer.  Sister with colon cancer, maternal grandmother sister OCP use: denies.  Estrogen and progesterone therapy: denies History of radiation to chest: denies.  Previous breast surgery: Denies  She has a complicated course of rest cancer diagnosis and surgery. # 04/16/2018 status post right breast mass lumpectomy and sentinel lymph node biopsy.  Pathology showed invasive mammary carcinoma 1.7 cm, DCIS, high-grade with extensive involvement of lobules.  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  Sentinel  lymph node biopsy negative. pT1c pN0. ER >90%, PR >90%, HER 2 negative.   05/14/2018 patient underwent MRI breast bilaterally to determined extent of positive deep margin/lobular type breast cancer. MRI breast showed an irregular 1.4 cm mass in the lower  inner quadrant of the left breast. Abnormal non-mass enhancement in the upper outer right breast, axillary tail region could be secondary to postsurgical changes, malignancy cannot be excluded. 06/05/2018 left breast mass biopsy showed invasive ductal carcinoma, grade 3, this was signed out by pathology group in [facility] Dr. Katrine Muskrat. ER 100%, PR 50%, not enough tissue for HER 2 analysis.  07/03/2018 patient underwent right breast deep margin lumpectomy/reexcision: Pathology showed negative for residual malignancy. Left breast needle localized lumpectomy showed negative for residual malignancy.  Sentinel lymph node negative In addition the outer slides on this patient's left breast biopsy were requested and reviewed by Dr. Janel.  Dr. Janel feels the patient's biopsy showed high-grade DCIS with focal areas suspicious for invasion.  Definitive invasive carcinoma is not identified.  DCIS is present in both outside blocks and slides from biopsy and measures 7 mm and 6 mm respectively.  pTis pN0 Patient case have been discussed on breast tumor board multiple times with reviewing of images and pathology findings. Left breast MRI did show a 1.4 cm irregularity, and pathology only 7 mm and a 6 mm DCIS were found at the initial left breast biopsy.  Final left breast lumpectomy did not contain clips which was placed with MRI guidance at biopsy.. Per my discussion with surgeon Dr.Sakai, even clips are found on repeat image, re-excision is not feasible and the next step will be mastectomy.   # 07/03/2018-07/06/2018 admitted for pain control, sentinel lymph node biopsy site hematoma,  #07/10/2018 - 07/14/2018 Patient admitted due to acute on chronic anemia due to hematoma.  She was admitted for evacuation of postoperative left axillary sentinel lymph node biopsy site hematoma.  Status post PRBC transfusion during her admission. # 08/14/2018-08/17/2018 readmitted due to breast cellulitis despite outpatient  drainage and oral antibiotics.  # 08/24/2018- 08/28/2018 readmitted due to recurrent right breast abscess. She was placed on IV antibiotics with Ceftriaxone  via picc line.  #A repeat US  has been done on 09/07/2018 and it shows near resolution of the fluid. # #Finished IV Ceftriaxone  on 09/11/2018.   Patient has a history of chronic respiratory failure/ COPD, on home oxygen .  # Right breast pT1c pN0, reexcision did not reveal any residual disease.. Oncotype DX showed recurrence rate of 31, patient has 15% benefit from adjuvant chemotherapy.  Decision was made not to proceed with adjuvant chemotherapy due to significant delays secondary to surgical complication, slow recovery from surgery, borderline performance status and comorbidities.  Patient declined radiation due to severe lung condition and is afraid of lung toxicities.SABRA  #Left breast high-grade DCIS She has been tolerating antiestrogen treatment with Arimidex  very well.  Manageable side effects. left breast biopsy clip was not found in the specimen. Continue Arimidex , tolerates well.   10/27/2018 left unilateral diagnostic mammogram showed left breast lower inner quadrant clip lstill presents results was discussed with patient and patient's surgeon Dr. Tye.  Dr. Tye feels the only option will be mastectomy. Discussed with patient that there is chance of remaining cancer in her left breast in the area where the clip was placed. Patient is not interested in additional breast surgeries.  Continue antiestrogen treatments and surveillance mammogram.  Left shoulder lump which has been worked up by ultrasound which showed a  lipoma. Recommend patient to follow-up with primary care provider.  If it continues to bother her she needs to see a surgery for evaluation and resection.  #09/17/2018 Started on ArimideX  # 10/23/2020, MRI breast bilaterally showed indeterminate stippled linear non-mass enhancement involving the lower inner quadrant of the left  breast extending from the prior lumpectomy site anteriorly, spanning 2.5 cm.  Indeterminate 0.7 cm mass involving the lower outer quadrant of the left breast at a posterior depth.  Indeterminate 0.9 cm masslike enhancement involving the right middle lobe laterally.  No pathologic lymphadenopathy.  No MRI evidence of malignancy involving the right breast.  # 10/30/2020  biopsy of the linear none mass enhancement in the lower inner posterior left breast.  The MRI biopsy of the second edition was unable to be performed Pathology showed high-grade DCIS of the left breast, the other smaller mass left outer quadrant not able to biopsied. Addendum, Hormone receptor status was added to her 10/30/2020 , results showed ER 95%, PR 0% She is currently on Arimidex  and I recommend patient to switch to other AI.   12/01/2020, left mastectomy with sentinel lymph node biopsy. Focal atypical ductal hyperplasia, background benign mammary parenchyma with fat necrosis.  Scar, and reactive keratinizing squamous metaplasia compatible with prior procedure site.  Clips x2 and biopsy sites identified.  Benign nipple/areolar complex.  Incidental seborrheic keratosis negative for definite DCIS and malignancy. 1 sentinel lymph nodes were negative   11/07/2020 CT chest wo contrast showed no evidence of metastatic disease within the thorax.  No masses or pathologically enlarged lymph nodes identified on this unenhanced exam.  Patient has multiple tiny calcified granulomas noted in the right upper lobe.  Mild scarring is seen in the right upper and middle lobes and inferior lingula.  No suspicious nodules or masses identified.  04/20/2021, given that patient developed recurrence while on Arimidex .  AI was switched to letrozole .  Her husband passed away in 2023-11-16.  INTERVAL HISTORY Amy Woodard is a 83 y.o. female who has above history reviewed by me today presents for follow up visit for management of breast  cancer. Patient tolerates letrozole  well.  Side effects are manageable. Chronic shortness of breath, unchanged.  Patient has no new breast concerns. She has noticed an area of soft tissue swelling around left shoulder.  She gained weight  She is trying to cope with the grieving process.  Review of Systems  Constitutional:  Negative for chills, fever, malaise/fatigue and weight loss.  HENT:  Negative for nosebleeds and sore throat.   Eyes:  Negative for double vision, photophobia and redness.  Respiratory:  Positive for shortness of breath. Negative for cough and wheezing.   Cardiovascular:  Negative for chest pain, palpitations, orthopnea and leg swelling.  Gastrointestinal:  Negative for abdominal pain, blood in stool, nausea and vomiting.  Genitourinary:  Negative for dysuria.  Musculoskeletal:  Negative for myalgias.  Skin:  Negative for itching and rash.  Neurological:  Negative for dizziness, tingling and tremors.  Endo/Heme/Allergies:  Negative for environmental allergies. Does not bruise/bleed easily.  Psychiatric/Behavioral:  Negative for hallucinations.     MEDICAL HISTORY:  Past Medical History:  Diagnosis Date   Allergy    Aortic atherosclerosis    Breast cancer (HCC) 05/2018   Right breast found 1st, then left breast with ductal ca   Cellulitis of breast 05/2018   right breast cellulitis after first biopsy,  antibiotics prescribed   COPD (chronic obstructive pulmonary disease) (HCC)    Coronary  atherosclerosis    Diabetes mellitus without complication (HCC)    Dyspnea    GERD (gastroesophageal reflux disease)    occasionally   History of kidney stones    HLD (hyperlipidemia)    Hypertension    Hypothyroidism    Oxygen  dependent    2liter nasal prong continuously   Pneumonia    Rheumatoid arthritis (HCC)    RLS (restless legs syndrome)     SURGICAL HISTORY: Past Surgical History:  Procedure Laterality Date   APPENDECTOMY     BACK SURGERY     lumbar.  herniated disc . no metal   BREAST BIOPSY Right 03/19/2018   INVASIVE MAMMARY CARCINOMA, NO SPECIAL TYPE   BREAST BIOPSY Left 06/01/2018   MRI bx, grade III invasive ductal carcinoma   BREAST BIOPSY Right 06/01/2018   MRI bx of enhancing mass, path showed FOREIGN BODY GRANULOMATOUS RESPONSE    BREAST LUMPECTOMY Right 04/16/2018   [facility], DCIS, LN negative   BREAST LUMPECTOMY Right 07/03/2018   Procedure: RE-EXCISION OF RIGHT BREAST TISSUE MASS;  Surgeon: Tye Millet, DO;  Location: [facility];  Service: General;  Laterality: Right;   BREAST LUMPECTOMY Left 06/2018   invasive ductal   IRRIGATION AND DEBRIDEMENT HEMATOMA Left 07/10/2018   Procedure: IRRIGATION AND DEBRIDEMENT HEMATOMA LEFT AXILLARY;  Surgeon: Tye Millet, DO;  Location: [facility];  Service: General;  Laterality: Left;   PARTIAL MASTECTOMY WITH NEEDLE LOCALIZATION AND AXILLARY SENTINEL LYMPH NODE BX Right 04/16/2018   Procedure: PARTIAL MASTECTOMY WITH NEEDLE LOCALIZATION AND AXILLARY SENTINEL LYMPH NODE BX;  Surgeon: Tye Millet, DO;  Location: [facility];  Service: General;  Laterality: Right;   PARTIAL MASTECTOMY WITH NEEDLE LOCALIZATION AND AXILLARY SENTINEL LYMPH NODE BX Left 07/03/2018   Procedure: PARTIAL MASTECTOMY WITH NEEDLE LOCALIZATION AND SENTINEL LYMPH NODE BX;  Surgeon: Tye Millet, DO;  Location: [facility];  Service: General;  Laterality: Left;   TOTAL MASTECTOMY Left 12/01/2020   Procedure: TOTAL MASTECTOMY;  Surgeon: Tye Millet, DO;  Location: [facility];  Service: General;  Laterality: Left;    SOCIAL HISTORY: Social History   Socioeconomic History   Marital status: Married    Spouse name: richard..disabled   Number of children: Not on file   Years of education: Not on file   Highest education level: Not on file  Occupational History   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  Tobacco Use   Smoking status: Former    Current packs/day: 0.00    Types: Cigarettes    Quit date: 03/30/1993     Years since quitting: 31.3   Smokeless tobacco: Never  Vaping Use   Vaping status: Never Used  Substance and Sexual Activity   Alcohol  use: No   Drug use: Never   Sexual activity: Not Currently  Other Topics Concern   Not on file  Social History Narrative   Not on file   Social Drivers of Health   Financial Resource Strain: Low Risk  (05/05/2024)   Received from [facility]   Overall Financial Resource Strain (CARDIA)    Difficulty of Paying Living Expenses: Not hard at all  Food Insecurity: No Food Insecurity (05/05/2024)   Received from [facility]   Hunger Vital Sign    Within the past 12 months, you worried that your food would run out before you got the money to buy more.: Never true    Within the past 12 months, the food you bought just didn't  last and you didn't have money to get more.: Never true  Transportation Needs: No Transportation Needs (05/05/2024)   Received from [facility] - Transportation    In the past 12 months, has lack of transportation kept you from medical appointments or from getting medications?: No    Lack of Transportation (Non-Medical): No  Physical Activity: Sufficiently Active (07/03/2018)   Exercise Vital Sign    Days of Exercise per Week: 7 days    Minutes of Exercise per Session: 30 min  Stress: No Stress Concern Present (07/03/2018)   [employer] of Occupational Health - Occupational Stress Questionnaire    Feeling of Stress : Not at all  Social Connections: Moderately Integrated (07/03/2018)   Social Connection and Isolation Panel    Frequency of Communication with Friends and Family: More than three times a week    Frequency of Social Gatherings with Friends and Family: More than three times a week    Attends Religious Services: 1 to 4 times per year    Active Member of [employer] or Organizations: No    Attends Banker Meetings: Never    Marital Status: Married   Catering Manager Violence: Not At Risk (07/03/2018)   Humiliation, Afraid, Rape, and Kick questionnaire    Fear of Current or Ex-Partner: No    Emotionally Abused: No    Physically Abused: No    Sexually Abused: No    FAMILY HISTORY: Family History  Problem Relation Age of Onset   Clotting disorder Mother    Melanoma Mother        deceased 40   Lung cancer Sister        deceased 93s; smoker   Alcohol  abuse Father        deceased 30   Cervical cancer Other        neice   Breast cancer Neg Hx     ALLERGIES:  is allergic to leflunomide and sulfa antibiotics.  MEDICATIONS:  Current Outpatient Medications  Medication Sig Dispense Refill   azelastine  (ASTELIN ) 0.1 % nasal spray Place 1 spray into both nostrils 2 (two) times daily.     Cholecalciferol (VITAMIN D3) 50 MCG (2000 UT) capsule TAKE 1 CAPSULE BY MOUTH EVERY DAY 100 capsule 1   fluticasone  (FLONASE ) 50 MCG/ACT nasal spray Place 2 sprays into both nostrils daily as needed for allergies.     Fluticasone -Umeclidin-Vilant (TRELEGY ELLIPTA) 100-62.5-25 MCG/ACT AEPB Inhale into the lungs.     folic acid  (FOLVITE ) 1 MG tablet Take 1 mg by mouth daily.     glipiZIDE  (GLUCOTROL  XL) 5 MG 24 hr tablet Take 5 mg by mouth daily.     hydroxychloroquine  (PLAQUENIL ) 200 MG tablet Take 400 mg by mouth daily.      Ipratropium-Albuterol  (COMBIVENT ) 20-100 MCG/ACT AERS respimat Inhale 1 puff into the lungs every 6 (six) hours as needed for wheezing or shortness of breath.     irbesartan  (AVAPRO ) 75 MG tablet Take 75 mg by mouth daily.     letrozole  (FEMARA ) 2.5 MG tablet TAKE 1 TABLET BY MOUTH EVERY DAY 30 tablet 0   levothyroxine  (SYNTHROID , LEVOTHROID) 50 MCG tablet Take 50 mcg by mouth daily before breakfast.     methotrexate (RHEUMATREX) 2.5 MG tablet Take by mouth. Take 4 tablets (10 mg total) by mouth every 7 (seven) days     omeprazole (PRILOSEC) 20 MG capsule Take 20 mg by mouth daily as needed.     HYDROcodone -acetaminophen   (NORCO/VICODIN)  5-325 MG tablet Take 1-2 tablets by mouth every 4 (four) hours as needed for moderate pain. (Patient not taking: Reported on 07/13/2024) 20 tablet 0   No current facility-administered medications for this visit.     PHYSICAL EXAMINATION: ECOG PERFORMANCE STATUS: 1 - Symptomatic but completely ambulatory Vitals:   07/13/24 1323  BP: 138/67  Pulse: 78  Resp: 16  Temp: 98.2 F (36.8 C)  SpO2: 94%   Filed Weights   07/13/24 1323  Weight: 141 lb (64 kg)    Physical Exam Constitutional:      General: She is not in acute distress.    Comments: On home oxygen   HENT:     Head: Normocephalic and atraumatic.  Eyes:     General: No scleral icterus.    Conjunctiva/sclera: Conjunctivae normal.     Pupils: Pupils are equal, round, and reactive to light.  Cardiovascular:     Rate and Rhythm: Normal rate and regular rhythm.     Heart sounds: Normal heart sounds.  Pulmonary:     Effort: Pulmonary effort is normal. No respiratory distress.     Comments: Decreased breath sound bilaterally Abdominal:     General: Bowel sounds are normal. There is no distension.     Palpations: Abdomen is soft. There is no mass.     Tenderness: There is no abdominal tenderness.  Musculoskeletal:        General: No deformity. Normal range of motion.     Cervical back: Normal range of motion and neck supple.  Lymphadenopathy:     Cervical: No cervical adenopathy.  Skin:    General: Skin is warm and dry.     Findings: No erythema or rash.     Comments: Left side posterior upper chest wall soft tissue nodule -lipoma  Neurological:     General: No focal deficit present.     Mental Status: She is alert and oriented to person, place, and time.     Cranial Nerves: No cranial nerve deficit.     Coordination: Coordination normal.    Breast exam was performed in seated  position. Patient is status post previous right lumpectomy.  Status post recent left mastectomy.   No palpable chest wall  mass.  No palpable right breast mass.    LABORATORY DATA:  I have reviewed the data as listed    Latest Ref Rng & Units 07/13/2024    1:15 PM 12/04/2023    1:51 PM 05/30/2023   11:34 AM  CBC  WBC 4.0 - 10.5 K/uL 7.4  9.9  9.1   Hemoglobin 12.0 - 15.0 g/dL 86.6  86.9  86.4   Hematocrit 36.0 - 46.0 % 42.8  41.8  43.4   Platelets 150 - 400 K/uL 355  344  332       Latest Ref Rng & Units 07/13/2024    1:15 PM 12/04/2023    1:53 PM 05/30/2023   11:34 AM  CMP  Glucose 70 - 99 mg/dL 893  898  896   BUN 8 - 23 mg/dL 17  17  14    Creatinine 0.44 - 1.00 mg/dL 9.06  8.99  8.98   Sodium 135 - 145 mmol/L 139  137  140   Potassium 3.5 - 5.1 mmol/L 4.3  4.5  3.9   Chloride 98 - 111 mmol/L 99  98  101   CO2 22 - 32 mmol/L 30  27  30    Calcium  8.9 - 10.3 mg/dL 8.9  8.8  9.3   Total Protein 6.5 - 8.1 g/dL 7.4  6.8  7.5   Total Bilirubin 0.0 - 1.2 mg/dL 0.4  0.5  0.5   Alkaline Phos 38 - 126 U/L 59  51  57   AST 15 - 41 U/L 19  15  17    ALT 0 - 44 U/L 14  10  12

## 2024-07-15 ENCOUNTER — Other Ambulatory Visit: Payer: Self-pay | Admitting: Oncology

## 2024-11-22 ENCOUNTER — Encounter

## 2025-01-11 ENCOUNTER — Inpatient Hospital Stay

## 2025-01-11 ENCOUNTER — Inpatient Hospital Stay: Admitting: Oncology
